# Patient Record
Sex: Male | Born: 1975
Health system: Southern US, Community
[De-identification: ages and names within clinical notes are randomized; demographics above are authoritative.]

## PROBLEM LIST (undated history)

## (undated) DIAGNOSIS — T7840XA Allergy, unspecified, initial encounter: Secondary | ICD-10-CM

## (undated) DIAGNOSIS — F419 Anxiety disorder, unspecified: Secondary | ICD-10-CM

## (undated) DIAGNOSIS — H548 Legal blindness, as defined in USA: Secondary | ICD-10-CM

## (undated) DIAGNOSIS — H409 Unspecified glaucoma: Secondary | ICD-10-CM

## (undated) DIAGNOSIS — M797 Fibromyalgia: Secondary | ICD-10-CM

## (undated) DIAGNOSIS — H269 Unspecified cataract: Secondary | ICD-10-CM

## (undated) HISTORY — DX: Legal blindness, as defined in USA: H54.8

## (undated) HISTORY — DX: Anxiety disorder, unspecified: F41.9

## (undated) HISTORY — DX: Unspecified cataract: H26.9

## (undated) HISTORY — PX: HERNIA REPAIR: SHX51

## (undated) HISTORY — DX: Allergy, unspecified, initial encounter: T78.40XA

## (undated) HISTORY — PX: EYE SURGERY: SHX253

---

## 1999-09-25 ENCOUNTER — Emergency Department (HOSPITAL_COMMUNITY): Admission: EM | Admit: 1999-09-25 | Discharge: 1999-09-25 | Payer: Self-pay | Admitting: *Deleted

## 1999-10-24 ENCOUNTER — Emergency Department (HOSPITAL_COMMUNITY): Admission: EM | Admit: 1999-10-24 | Discharge: 1999-10-24 | Payer: Self-pay | Admitting: Emergency Medicine

## 1999-10-24 ENCOUNTER — Encounter: Payer: Self-pay | Admitting: Emergency Medicine

## 2001-06-09 ENCOUNTER — Emergency Department (HOSPITAL_COMMUNITY): Admission: EM | Admit: 2001-06-09 | Discharge: 2001-06-09 | Payer: Self-pay | Admitting: Emergency Medicine

## 2001-10-28 ENCOUNTER — Emergency Department (HOSPITAL_COMMUNITY): Admission: EM | Admit: 2001-10-28 | Discharge: 2001-10-28 | Payer: Self-pay | Admitting: Emergency Medicine

## 2001-12-30 ENCOUNTER — Emergency Department (HOSPITAL_COMMUNITY): Admission: EM | Admit: 2001-12-30 | Discharge: 2001-12-31 | Payer: Self-pay | Admitting: Emergency Medicine

## 2002-08-08 ENCOUNTER — Emergency Department (HOSPITAL_COMMUNITY): Admission: EM | Admit: 2002-08-08 | Discharge: 2002-08-08 | Payer: Self-pay | Admitting: Emergency Medicine

## 2003-06-04 ENCOUNTER — Emergency Department (HOSPITAL_COMMUNITY): Admission: EM | Admit: 2003-06-04 | Discharge: 2003-06-04 | Payer: Self-pay | Admitting: Emergency Medicine

## 2003-06-04 ENCOUNTER — Encounter: Payer: Self-pay | Admitting: Emergency Medicine

## 2003-09-25 ENCOUNTER — Encounter: Admission: RE | Admit: 2003-09-25 | Discharge: 2003-10-16 | Payer: Self-pay | Admitting: *Deleted

## 2003-11-13 ENCOUNTER — Encounter: Admission: RE | Admit: 2003-11-13 | Discharge: 2003-11-13 | Payer: Self-pay | Admitting: Family Medicine

## 2003-12-05 ENCOUNTER — Ambulatory Visit (HOSPITAL_COMMUNITY): Admission: RE | Admit: 2003-12-05 | Discharge: 2003-12-05 | Payer: Self-pay | Admitting: Surgery

## 2003-12-05 ENCOUNTER — Encounter (INDEPENDENT_AMBULATORY_CARE_PROVIDER_SITE_OTHER): Payer: Self-pay | Admitting: Specialist

## 2003-12-19 ENCOUNTER — Encounter
Admission: RE | Admit: 2003-12-19 | Discharge: 2004-01-21 | Payer: Self-pay | Admitting: Physical Medicine and Rehabilitation

## 2004-04-20 ENCOUNTER — Emergency Department (HOSPITAL_COMMUNITY): Admission: EM | Admit: 2004-04-20 | Discharge: 2004-04-20 | Payer: Self-pay | Admitting: Emergency Medicine

## 2004-08-11 ENCOUNTER — Emergency Department (HOSPITAL_COMMUNITY): Admission: EM | Admit: 2004-08-11 | Discharge: 2004-08-12 | Payer: Self-pay | Admitting: Emergency Medicine

## 2005-07-28 ENCOUNTER — Emergency Department (HOSPITAL_COMMUNITY): Admission: EM | Admit: 2005-07-28 | Discharge: 2005-07-28 | Payer: Self-pay | Admitting: Emergency Medicine

## 2006-01-03 ENCOUNTER — Emergency Department (HOSPITAL_COMMUNITY): Admission: EM | Admit: 2006-01-03 | Discharge: 2006-01-03 | Payer: Self-pay | Admitting: Emergency Medicine

## 2006-05-29 ENCOUNTER — Emergency Department (HOSPITAL_COMMUNITY): Admission: EM | Admit: 2006-05-29 | Discharge: 2006-05-29 | Payer: Self-pay | Admitting: Emergency Medicine

## 2006-09-21 ENCOUNTER — Emergency Department (HOSPITAL_COMMUNITY): Admission: EM | Admit: 2006-09-21 | Discharge: 2006-09-21 | Payer: Self-pay | Admitting: Emergency Medicine

## 2007-12-26 ENCOUNTER — Emergency Department (HOSPITAL_COMMUNITY): Admission: EM | Admit: 2007-12-26 | Discharge: 2007-12-26 | Payer: Self-pay | Admitting: Emergency Medicine

## 2009-03-25 ENCOUNTER — Emergency Department (HOSPITAL_COMMUNITY): Admission: EM | Admit: 2009-03-25 | Discharge: 2009-03-25 | Payer: Self-pay | Admitting: Emergency Medicine

## 2009-10-06 ENCOUNTER — Emergency Department (HOSPITAL_COMMUNITY): Admission: EM | Admit: 2009-10-06 | Discharge: 2009-10-06 | Payer: Self-pay | Admitting: Emergency Medicine

## 2009-10-08 ENCOUNTER — Emergency Department (HOSPITAL_COMMUNITY): Admission: EM | Admit: 2009-10-08 | Discharge: 2009-10-08 | Payer: Self-pay | Admitting: Emergency Medicine

## 2010-05-28 ENCOUNTER — Emergency Department (HOSPITAL_COMMUNITY): Admission: EM | Admit: 2010-05-28 | Discharge: 2010-05-28 | Payer: Self-pay | Admitting: Emergency Medicine

## 2010-11-02 ENCOUNTER — Emergency Department (HOSPITAL_COMMUNITY)
Admission: EM | Admit: 2010-11-02 | Discharge: 2010-11-03 | Disposition: A | Discharge status: Home / Self Care | Payer: Self-pay | Attending: Emergency Medicine | Admitting: Emergency Medicine

## 2010-11-02 DIAGNOSIS — K5289 Other specified noninfective gastroenteritis and colitis: Secondary | ICD-10-CM | POA: Insufficient documentation

## 2010-11-02 DIAGNOSIS — Z8249 Family history of ischemic heart disease and other diseases of the circulatory system: Secondary | ICD-10-CM | POA: Insufficient documentation

## 2010-11-02 DIAGNOSIS — Z833 Family history of diabetes mellitus: Secondary | ICD-10-CM | POA: Insufficient documentation

## 2010-11-02 DIAGNOSIS — Z823 Family history of stroke: Secondary | ICD-10-CM | POA: Insufficient documentation

## 2010-11-03 LAB — POCT I-STAT, CHEM 8
Chloride: 104 mEq/L (ref 96–112)
Creatinine, Ser: 1.2 mg/dL (ref 0.4–1.5)
Hemoglobin: 16 g/dL (ref 13.0–17.0)
Potassium: 4.2 mEq/L (ref 3.5–5.1)
Sodium: 140 mEq/L (ref 135–145)

## 2010-11-03 LAB — DIFFERENTIAL
Basophils Absolute: 0 10*3/uL (ref 0.0–0.1)
Lymphocytes Relative: 11 % — ABNORMAL LOW (ref 12–46)
Lymphs Abs: 0.6 10*3/uL — ABNORMAL LOW (ref 0.7–4.0)
Monocytes Absolute: 0.3 10*3/uL (ref 0.1–1.0)
Neutro Abs: 4.4 10*3/uL (ref 1.7–7.7)

## 2010-11-03 LAB — CBC
HCT: 46 % (ref 39.0–52.0)
Hemoglobin: 15.1 g/dL (ref 13.0–17.0)
WBC: 5.3 10*3/uL (ref 4.0–10.5)

## 2010-12-14 LAB — GLUCOSE, CAPILLARY: Glucose-Capillary: 91 mg/dL (ref 70–99)

## 2011-08-03 ENCOUNTER — Emergency Department (HOSPITAL_COMMUNITY): Payer: Self-pay

## 2011-08-03 ENCOUNTER — Emergency Department (HOSPITAL_COMMUNITY)
Admission: EM | Admit: 2011-08-03 | Discharge: 2011-08-03 | Disposition: A | Discharge status: Home / Self Care | Payer: Self-pay | Attending: Emergency Medicine | Admitting: Emergency Medicine

## 2011-08-03 DIAGNOSIS — J3489 Other specified disorders of nose and nasal sinuses: Secondary | ICD-10-CM | POA: Insufficient documentation

## 2011-08-03 DIAGNOSIS — R059 Cough, unspecified: Secondary | ICD-10-CM | POA: Insufficient documentation

## 2011-08-03 DIAGNOSIS — R05 Cough: Secondary | ICD-10-CM | POA: Insufficient documentation

## 2011-08-03 DIAGNOSIS — B349 Viral infection, unspecified: Secondary | ICD-10-CM

## 2011-08-03 DIAGNOSIS — R509 Fever, unspecified: Secondary | ICD-10-CM | POA: Insufficient documentation

## 2011-08-03 DIAGNOSIS — B9789 Other viral agents as the cause of diseases classified elsewhere: Secondary | ICD-10-CM | POA: Insufficient documentation

## 2011-08-03 NOTE — ED Provider Notes (Signed)
History     CSN: [id number] Arrival date & time: No admission date for patient encounter.   First MD Initiated Contact with Patient 08/03/11 1922      Chief Complaint  Patient presents with  . Fever   Complains of cough productive of yellow sputum since yesterday with subjective fever. Also admits to  mild sore throat and pain with swallowing no shortness of breath no nausea or vomiting no other associated symptoms swallowing make sore throat worse nothing makes symptoms better no other complaint. Treated with DayQuil and NyQuil without relief (Consider location/radiation/quality/duration/timing/severity/associated sxs/prior treatment) HPI  No past medical history on file. Past medical history neck No past surgical history on file. Surgical history negative No family history on file.  History  Substance Use Topics  . Smoking status: Not on file  . Smokeless tobacco: Not on file  . Alcohol Use:      Social history no tobacco no alcohol no drug Review of Systems  Constitutional: Positive for fever.  HENT: Positive for congestion.        Sore throat  Respiratory: Positive for cough.   Cardiovascular: Negative.   Gastrointestinal: Negative.   Musculoskeletal: Negative.   Skin: Negative.   Neurological: Negative.   Hematological: Negative.   Psychiatric/Behavioral: Negative.   All other systems reviewed and are negative.    Allergies  Review of patient's allergies indicates no known allergies.  Home Medications   Current Outpatient Rx  Name Route Sig Dispense Refill  . PHENYLEPH-DOXYLAMINE-DM-APAP PO MISC Oral Take 1 each by mouth as needed. Fever /tried to break cold sweats       BP 122/78  Pulse 102  Temp(Src) 100 F (37.8 C) (Oral)  Resp 20  SpO2 99%  Physical Exam  Nursing note and vitals reviewed. Constitutional: He appears well-developed and well-nourished.  HENT:  Head: Normocephalic and atraumatic.  Eyes: Conjunctivae are normal. Pupils are  equal, round, and reactive to light.  Neck: Neck supple. No tracheal deviation present. No thyromegaly present.  Cardiovascular: Normal rate and regular rhythm.   No murmur heard. Pulmonary/Chest: Effort normal and breath sounds normal.  Abdominal: Soft. Bowel sounds are normal. He exhibits no distension. There is no tenderness.  Musculoskeletal: Normal range of motion. He exhibits no edema and no tenderness.  Neurological: He is alert. Coordination normal.  Skin: Skin is warm and dry. No rash noted.  Psychiatric: He has a normal mood and affect.    ED Course  Procedures (including critical care time)  Labs Reviewed - No data to display No results found.  Results for orders placed during the hospital encounter of 11/02/10  POCT I-STAT, CHEM 8      Component Value Range   Sodium 140  135 - 145 (mEq/L)   Potassium 4.2  3.5 - 5.1 (mEq/L)   Chloride 104  96 - 112 (mEq/L)   BUN 10  6 - 23 (mg/dL)   Creatinine, Ser 1.2  0.4 - 1.5 (mg/dL)   Glucose, Bld 409 (*) 70 - 99 (mg/dL)   Calcium, Ion 8.11 (*) 1.12 - 1.32 (mmol/L)   TCO2 24  0 - 100 (mmol/L)   Hemoglobin 16.0  13.0 - 17.0 (g/dL)   HCT 91.4  78.2 - 95.6 (%)  DIFFERENTIAL      Component Value Range   Neutrophils Relative 83 (*) 43 - 77 (%)   Neutro Abs 4.4  1.7 - 7.7 (K/uL)   Lymphocytes Relative 11 (*) 12 - 46 (%)   Lymphs  Abs 0.6 (*) 0.7 - 4.0 (K/uL)   Monocytes Relative 5  3 - 12 (%)   Monocytes Absolute 0.3  0.1 - 1.0 (K/uL)   Eosinophils Relative 1  0 - 5 (%)   Eosinophils Absolute 0.1  0.0 - 0.7 (K/uL)   Basophils Relative 0  0 - 1 (%)   Basophils Absolute 0.0  0.0 - 0.1 (K/uL)  CBC      Component Value Range   WBC 5.3  4.0 - 10.5 (K/uL)   RBC 5.69  4.22 - 5.81 (MIL/uL)   Hemoglobin 15.1  13.0 - 17.0 (g/dL)   HCT 16.1  09.6 - 04.5 (%)   MCV 80.8  78.0 - 100.0 (fL)   MCH 26.5  26.0 - 34.0 (pg)   MCHC 32.8  30.0 - 36.0 (g/dL)   RDW 40.9  81.1 - 91.4 (%)   Platelets 186  150 - 400 (K/uL)   Dg Chest 2  View  08/03/2011  *RADIOLOGY REPORT*  Clinical Data: Cough, congestion, sore throat and fever  CHEST - 2 VIEW  Comparison: The chest radiograph 10/08/2009  Findings: Normal and stable heart, mediastinal, and hilar contours. Trachea is midline.  The lungs are well expanded and clear.  No focal airspace disease, effusion, or pneumothorax.  Bony thorax appears within normal limits.  IMPRESSION: No acute cardiopulmonary disease.  Original Report Authenticated By: Britta Mccreedy, M.D.    No diagnosis found.    MDM  Suspect viral illness  Influenza possible. Plan Tylenol followup PMD if not better one week Diagnosis viral illness      Doug Sou, MD 08/03/11 2019

## 2011-08-03 NOTE — ED Notes (Signed)
Green phlegm.....100. Temp muscle aches

## 2012-06-06 ENCOUNTER — Emergency Department (HOSPITAL_COMMUNITY)
Admission: EM | Admit: 2012-06-06 | Discharge: 2012-06-06 | Disposition: A | Discharge status: Home / Self Care | Payer: Self-pay | Attending: Emergency Medicine | Admitting: Emergency Medicine

## 2012-06-06 ENCOUNTER — Encounter (HOSPITAL_COMMUNITY): Payer: Self-pay | Admitting: Emergency Medicine

## 2012-06-06 DIAGNOSIS — IMO0001 Reserved for inherently not codable concepts without codable children: Secondary | ICD-10-CM | POA: Insufficient documentation

## 2012-06-06 DIAGNOSIS — M797 Fibromyalgia: Secondary | ICD-10-CM

## 2012-06-06 HISTORY — DX: Fibromyalgia: M79.7

## 2012-06-06 HISTORY — DX: Unspecified glaucoma: H40.9

## 2012-06-06 MED ORDER — METHOCARBAMOL 500 MG PO TABS
1000.0000 mg | ORAL_TABLET | Freq: Once | ORAL | Status: AC
  Administered 2012-06-06: 1000 mg via ORAL
  Filled 2012-06-06: qty 2

## 2012-06-06 MED ORDER — IBUPROFEN 800 MG PO TABS: 800.0000 mg | ORAL_TABLET | Freq: Three times a day (TID) | ORAL | Status: DC

## 2012-06-06 MED ORDER — OXYCODONE-ACETAMINOPHEN 5-325 MG PO TABS: 1.0000 | ORAL_TABLET | Freq: Four times a day (QID) | ORAL | Status: DC | PRN

## 2012-06-06 MED ORDER — GABAPENTIN 300 MG PO CAPS: 300.0000 mg | ORAL_CAPSULE | Freq: Three times a day (TID) | ORAL | Status: DC

## 2012-06-06 MED ORDER — OXYCODONE-ACETAMINOPHEN 5-325 MG PO TABS
2.0000 | ORAL_TABLET | Freq: Once | ORAL | Status: AC
  Administered 2012-06-06: 2 via ORAL
  Filled 2012-06-06: qty 2

## 2012-06-06 MED ORDER — HYDROMORPHONE HCL PF 1 MG/ML IJ SOLN
1.0000 mg | Freq: Once | INTRAMUSCULAR | Status: AC
  Administered 2012-06-06: 1 mg via INTRAMUSCULAR
  Filled 2012-06-06: qty 1

## 2012-06-06 NOTE — ED Notes (Signed)
Pt states that his fibromyalgia is flaring up.  He states that the pain started two days ago is predominately in his back around his left shoulder blade and across his pelvis.  States that he has had N/V since last night.

## 2012-06-06 NOTE — ED Provider Notes (Signed)
History     CSN: [id number]  Arrival date & time 06/06/12  1308   First MD Initiated Contact with Patient 06/06/12 1855      Chief Complaint  Patient presents with  . Back Pain  . Hip Pain    (Consider location/radiation/quality/duration/timing/severity/associated sxs/prior treatment) HPI Comments: Bryce [name] 36 y.o. male   The chief complaint is: Patient presents with:   Back Pain   Hip Pain   The patient has medical history significant for:   Past Medical History:   Fibromyalgia                                                 Glaucoma                                                    Patient presents for pain managagement for his fibromyalgia. Patient states that he was diagnosed 10 years ago and has been on multiple medications for pain. He states that due to lack of insurance he currently does not have any medications for pain management. Today her reports diffuse myalgias rated 10/10. Patient states the pain is like his prior fibromyalgia pain. Associated symptoms include and episode nausea and vomiting that the patient states is due to pain. Denies fever or chills. Denies abdominal pain. Denies difficulty ambulating. Denies difficulty ambulating.      No language interpreter was used.    Past Medical History  Diagnosis Date  . Fibromyalgia   . Glaucoma     Past Surgical History  Procedure Date  . Eye surgery   . Hernia repair     No family history on file.  History  Substance Use Topics  . Smoking status: Never Smoker   . Smokeless tobacco: Not on file  . Alcohol Use: No      Review of Systems  Constitutional: Negative for fever and chills.  Gastrointestinal: Positive for nausea and vomiting. Negative for abdominal pain.  Musculoskeletal: Positive for myalgias. Negative for gait problem.  All other systems reviewed and are negative.    Allergies  Citrus  Home Medications   Current Outpatient Rx  Name Route Sig Dispense Refill  .  ACETAMINOPHEN 500 MG PO TABS Oral Take 1,000 mg by mouth every 6 (six) hours as needed. Pain      BP 127/88  Pulse 84  Temp 99.2 F (37.3 C) (Oral)  Resp 15  SpO2 100%  Physical Exam  Nursing note and vitals reviewed. Constitutional: He appears well-developed and well-nourished. No distress.  HENT:  Head: Normocephalic and atraumatic.  Mouth/Throat: Oropharynx is clear and moist.  Eyes: EOM are normal. No scleral icterus.  Neck: Normal range of motion. Neck supple.  Cardiovascular: Normal rate and regular rhythm.   Pulmonary/Chest: Effort normal and breath sounds normal.  Abdominal: Soft. Bowel sounds are normal. There is no tenderness.  Musculoskeletal: Normal range of motion. He exhibits tenderness. He exhibits no edema.       Patient has general tenderness to palpation.  Neurological: He is alert.  Skin: Skin is warm and dry.    ED Course  Procedures (including critical care time)  Labs Reviewed - No data to display No results  found.   1. Fibromyalgia       MDM  Patient presented for exacerbation of his fibromyalgia. Patient given pain medication and gabapentin with some relief. Patient discharged on same regimen with recommendation to follow-up with primary care. Return precautions given verbally and in discharge summary.         Pixie Casino, PA-C 06/07/12 [phone]

## 2012-06-08 NOTE — ED Provider Notes (Signed)
Medical screening examination/treatment/procedure(s) were performed by non-physician practitioner and as supervising physician I was immediately available for consultation/collaboration.  Jones Skene, M.D.      Jones Skene, MD 06/08/12 1317

## 2012-11-03 ENCOUNTER — Emergency Department (HOSPITAL_COMMUNITY)
Admission: EM | Admit: 2012-11-03 | Discharge: 2012-11-03 | Disposition: A | Discharge status: Home / Self Care | Payer: Self-pay | Attending: Emergency Medicine | Admitting: Emergency Medicine

## 2012-11-03 DIAGNOSIS — M79609 Pain in unspecified limb: Secondary | ICD-10-CM | POA: Insufficient documentation

## 2012-11-03 DIAGNOSIS — R0602 Shortness of breath: Secondary | ICD-10-CM | POA: Insufficient documentation

## 2012-11-03 DIAGNOSIS — Z8669 Personal history of other diseases of the nervous system and sense organs: Secondary | ICD-10-CM | POA: Insufficient documentation

## 2012-11-03 DIAGNOSIS — G8929 Other chronic pain: Secondary | ICD-10-CM | POA: Insufficient documentation

## 2012-11-03 DIAGNOSIS — J3489 Other specified disorders of nose and nasal sinuses: Secondary | ICD-10-CM | POA: Insufficient documentation

## 2012-11-03 DIAGNOSIS — Z79899 Other long term (current) drug therapy: Secondary | ICD-10-CM | POA: Insufficient documentation

## 2012-11-03 DIAGNOSIS — M549 Dorsalgia, unspecified: Secondary | ICD-10-CM | POA: Insufficient documentation

## 2012-11-03 DIAGNOSIS — R110 Nausea: Secondary | ICD-10-CM | POA: Insufficient documentation

## 2012-11-03 DIAGNOSIS — M797 Fibromyalgia: Secondary | ICD-10-CM

## 2012-11-03 DIAGNOSIS — IMO0001 Reserved for inherently not codable concepts without codable children: Secondary | ICD-10-CM | POA: Insufficient documentation

## 2012-11-03 MED ORDER — GABAPENTIN 300 MG PO CAPS: 300.0000 mg | ORAL_CAPSULE | Freq: Three times a day (TID) | ORAL | Status: DC

## 2012-11-03 MED ORDER — OXYCODONE-ACETAMINOPHEN 5-325 MG PO TABS
2.0000 | ORAL_TABLET | Freq: Once | ORAL | Status: AC
  Administered 2012-11-03: 2 via ORAL
  Filled 2012-11-03: qty 2

## 2012-11-03 NOTE — ED Provider Notes (Signed)
History  This chart was scribed for non-physician practitioner working with Ward Givens, MD, by Candelaria Stagers, ED Scribe. This patient was seen in room WTR5/WTR5 and the patient's care was started at 6:45 PM   CSN: [id number]  Arrival date & time 11/03/12  1826   First MD Initiated Contact with Patient 11/03/12 1829      Chief Complaint  Patient presents with  . Muscle Pain    The history is provided by the patient. No language interpreter was used.   Bryce [name] is a 37 y.o. male who presents to the Emergency Department complaining of bilateral leg and back pain associated with h/o fibromyalgia that began to flare up yesterday morning.  Pt reports he has taken Vicodin from a friend for the pain with no relief.  Used to take gabapentin but no longer has this. The pain is preventing him from sleeping.  He is also experiencing nausea, SOB, and congestion which he reports is chronic due to eye drops he uses daily for glaucoma.  Pt contributes the SOB to anxiety associated with pain.  Pt had xrays and an MRI to back with no remarkable findings.  Pt is ambulatory with pain.  He did not drive himself here.  Pt has h/o glaucoma and is legally blind.  No PCP. Past Medical History  Diagnosis Date  . Fibromyalgia   . Glaucoma     Past Surgical History  Procedure Laterality Date  . Eye surgery    . Hernia repair      No family history on file.  History  Substance Use Topics  . Smoking status: Never Smoker   . Smokeless tobacco: Not on file  . Alcohol Use: No      Review of Systems  Musculoskeletal: Positive for back pain and arthralgias (bilateral leg pain).  All other systems reviewed and are negative.    Allergies  Citrus  Home Medications   Current Outpatient Rx  Name  Route  Sig  Dispense  Refill  . brimonidine (ALPHAGAN) 0.2 % ophthalmic solution   Both Eyes   Place 1 drop into both eyes 3 (three) times daily.         . dorzolamide-timolol (COSOPT) 22.3-6.8  MG/ML ophthalmic solution   Both Eyes   Place 1 drop into both eyes 2 (two) times daily.         [name] gabapentin (NEURONTIN) 300 MG capsule   Oral   Take 1 capsule (300 mg total) by mouth 3 (three) times daily.   60 capsule   1   . ibuprofen (ADVIL,MOTRIN) 800 MG tablet   Oral   Take 1 tablet (800 mg total) by mouth 3 (three) times daily.   21 tablet   0   . oxyCODONE-acetaminophen (PERCOCET/ROXICET) 5-325 MG per tablet   Oral   Take 1 tablet by mouth every 6 (six) hours as needed for pain.   20 tablet   0   . travoprost, benzalkonium, (TRAVATAN) 0.004 % ophthalmic solution   Both Eyes   Place 1 drop into both eyes at bedtime.           BP 129/78  Pulse 78  Temp(Src) 98.8 F (37.1 C) (Oral)  SpO2 98%  Physical Exam  Nursing note and vitals reviewed. Constitutional: He is oriented to person, place, and time. He appears well-developed and well-nourished. No distress.  HENT:  Head: Normocephalic and atraumatic.  Right Ear: Tympanic membrane and ear canal normal.  Left Ear: Tympanic  membrane and ear canal normal.  Mouth/Throat: Oropharynx is clear and moist. No oropharyngeal exudate.  Eyes: Conjunctivae and EOM are normal.  Neck: Normal range of motion. Neck supple. No tracheal deviation present.  Cardiovascular: Normal rate, regular rhythm and normal heart sounds.   No murmur heard. Pulmonary/Chest: Effort normal and breath sounds normal. No respiratory distress. He has no wheezes. He has no rales.  Abdominal: Soft. Bowel sounds are normal. There is no tenderness.  Musculoskeletal: Normal range of motion.  Pain and stiffness noted more in posterior legs and hips than anterior.    Neurological: He is alert and oriented to person, place, and time.  Chronic numbness and neuropathy to legs bilaterally.    Skin: Skin is warm and dry.  Psychiatric: He has a normal mood and affect. His behavior is normal.    ED Course  Procedures  DIAGNOSTIC STUDIES: Oxygen  Saturation is 98% on room air, normal by my interpretation.    COORDINATION OF CARE:  6:52 PM Discussed course of care with pt.  Pt understands and agrees.      Labs Reviewed - No data to display No results found.   1. Fibromyalgia   2. Chronic pain       MDM  37 y/o male with fibromyalgia needing medication. Rx gabapentin. Percocet given in ED. Resource guide given for PCP f/u. Return precautions discussed. Patient states understanding of plan and is agreeable.    I personally performed the services described in this documentation, which was scribed in my presence. The recorded information has been reviewed and is accurate.       Trevor Mace, PA-C 11/03/12 Ernest[place]

## 2012-11-03 NOTE — ED Notes (Signed)
Pt c/o pain to back and legs. Pt states he has a hx of Fibromyalgia and is legally blind. Pt ambulatory to exam room with slow steady gait. Pt states his wife drove him here. States he has taken Vicodin for pain this morning.

## 2012-11-03 NOTE — ED Provider Notes (Signed)
Medical screening examination/treatment/procedure(s) were performed by non-physician practitioner and as supervising physician I was immediately available for consultation/collaboration. Xitlaly Ault, MD, FACEP   Keisi Eckford L Kvon Mcilhenny, MD 11/03/12 1936 

## 2013-02-25 ENCOUNTER — Emergency Department (HOSPITAL_COMMUNITY)
Admission: EM | Admit: 2013-02-25 | Discharge: 2013-02-25 | Disposition: A | Discharge status: Home / Self Care | Payer: Medicaid Other | Attending: Emergency Medicine | Admitting: Emergency Medicine

## 2013-02-25 ENCOUNTER — Encounter (HOSPITAL_COMMUNITY): Payer: Self-pay

## 2013-02-25 DIAGNOSIS — Z91040 Latex allergy status: Secondary | ICD-10-CM | POA: Insufficient documentation

## 2013-02-25 DIAGNOSIS — IMO0001 Reserved for inherently not codable concepts without codable children: Secondary | ICD-10-CM | POA: Insufficient documentation

## 2013-02-25 DIAGNOSIS — Z8669 Personal history of other diseases of the nervous system and sense organs: Secondary | ICD-10-CM | POA: Insufficient documentation

## 2013-02-25 DIAGNOSIS — M79609 Pain in unspecified limb: Secondary | ICD-10-CM | POA: Insufficient documentation

## 2013-02-25 DIAGNOSIS — Z79899 Other long term (current) drug therapy: Secondary | ICD-10-CM | POA: Insufficient documentation

## 2013-02-25 DIAGNOSIS — M791 Myalgia, unspecified site: Secondary | ICD-10-CM

## 2013-02-25 DIAGNOSIS — R21 Rash and other nonspecific skin eruption: Secondary | ICD-10-CM | POA: Insufficient documentation

## 2013-02-25 LAB — D-DIMER, QUANTITATIVE: D-Dimer, Quant: <0.27 ug/mL-FEU (ref 0.00–0.48)

## 2013-02-25 MED ORDER — DIPHENHYDRAMINE HCL 25 MG PO CAPS
25.0000 mg | ORAL_CAPSULE | Freq: Once | ORAL | Status: AC
  Administered 2013-02-25: 25 mg via ORAL
  Filled 2013-02-25: qty 1

## 2013-02-25 MED ORDER — IBUPROFEN 200 MG PO TABS
600.0000 mg | ORAL_TABLET | Freq: Once | ORAL | Status: AC
  Administered 2013-02-25: 600 mg via ORAL
  Filled 2013-02-25: qty 3

## 2013-02-25 MED ORDER — ACETAMINOPHEN-CODEINE #3 300-30 MG PO TABS: 1.0000 | ORAL_TABLET | Freq: Four times a day (QID) | ORAL | Status: DC | PRN

## 2013-02-25 MED ORDER — PREDNISONE 50 MG PO TABS: 50.0000 mg | ORAL_TABLET | Freq: Every day | ORAL | Status: DC

## 2013-02-25 NOTE — ED Notes (Signed)
He c/o persistent pain in left calf which is aggravated by all activity.  He states he has hx of fibromyalgia and commonly has diffuse aches, "but this is different".  He also states he has had a non-pruritic, dry, flaky rash in small patches on upper extremities and left post. Shoulder area x 1 month which has remained unchanged by application of o.t.c. Hydrocortisone cream.  He is in no distress.  He denies any trauma, nor any recent periods of extended immobility/long-distance travel.

## 2013-02-25 NOTE — ED Provider Notes (Signed)
History     CSN: [id number]  Arrival date & time 02/25/13  0920   First MD Initiated Contact with Patient 02/25/13 [phone]      Chief Complaint  Patient presents with  . Leg Pain    Also has few spots of dry rash    (Consider location/radiation/quality/duration/timing/severity/associated sxs/prior treatment) HPI Comments: Pt c/o persistent pain in left calf which is aggravated by all activity. Calf pain x 2 weeks.  He states he has hx of fibromyalgia and commonly has diffuse aches, "but this is different".  He also states he has had a non-pruritic, dry, flaky rash in the RUE, that has spread. No hx of allergies, no hx of skin dz, no exposures.   Patient is a 37 y.o. male presenting with leg pain. The history is provided by the patient.  Leg Pain   Past Medical History  Diagnosis Date  . Fibromyalgia   . Glaucoma     Past Surgical History  Procedure Laterality Date  . Eye surgery    . Hernia repair      History reviewed. No pertinent family history.  History  Substance Use Topics  . Smoking status: Never Smoker   . Smokeless tobacco: Not on file  . Alcohol Use: No      Review of Systems  Constitutional: Negative for activity change and appetite change.  Respiratory: Negative for cough and shortness of breath.   Cardiovascular: Negative for chest pain.  Gastrointestinal: Negative for abdominal pain.  Genitourinary: Negative for dysuria.  Musculoskeletal: Positive for myalgias.  Skin: Positive for rash.    Allergies  Citrus and Latex  Home Medications   Current Outpatient Rx  Name  Route  Sig  Dispense  Refill  . brimonidine (ALPHAGAN) 0.2 % ophthalmic solution   Both Eyes   Place 1 drop into both eyes 2 (two) times daily.          . cycloSPORINE (RESTASIS) 0.05 % ophthalmic emulsion   Both Eyes   Place 1 drop into both eyes 6 (six) times daily.         . dorzolamide-timolol (COSOPT) 22.3-6.8 MG/ML ophthalmic solution   Both Eyes   Place 1 drop  into both eyes 2 (two) times daily.         [name] gabapentin (NEURONTIN) 300 MG capsule   Oral   Take 1 capsule (300 mg total) by mouth 3 (three) times daily.   90 capsule   0   . ibuprofen (ADVIL,MOTRIN) 800 MG tablet   Oral   Take 1 tablet (800 mg total) by mouth 3 (three) times daily.   21 tablet   0   . oxyCODONE-acetaminophen (PERCOCET/ROXICET) 5-325 MG per tablet   Oral   Take 1 tablet by mouth every 6 (six) hours as needed for pain.   20 tablet   0   . travoprost, benzalkonium, (TRAVATAN) 0.004 % ophthalmic solution   Both Eyes   Place 1 drop into both eyes at bedtime.           BP 121/80  Pulse 85  Temp(Src) 99.2 F (37.3 C) (Oral)  Resp 18  SpO2 96%  Physical Exam  Nursing note and vitals reviewed. Constitutional: He is oriented to person, place, and time. He appears well-developed.  HENT:  Head: Normocephalic and atraumatic.  Eyes: Conjunctivae and EOM are normal. Pupils are equal, round, and reactive to light.  Neck: Normal range of motion. Neck supple.  Cardiovascular: Normal rate and regular rhythm.  Pulmonary/Chest: Effort normal and breath sounds normal.  Abdominal: Soft. Bowel sounds are normal. He exhibits no distension. There is no tenderness. There is no rebound and no guarding.  Musculoskeletal:       Left lower leg: He exhibits tenderness. He exhibits no bony tenderness, no swelling and no edema.  Left calf tenderness.  Neurological: He is alert and oriented to person, place, and time.  Skin: Skin is warm. Rash noted.  Pt has a few scattered scaly lesion, with multiple vesicles grouped together. No signs of abscess/infection. No oral involvement.      ED Course  Procedures (including critical care time)  Labs Reviewed  D-DIMER, QUANTITATIVE   No results found.   No diagnosis found.    MDM  Pt comes in with cc of left leg pain, rash. Pulses are normal, no concerns for ischemic process of the leg. He has calf tendnress, WELLS  score is 1, we will get dimer. Rash- not sure what the etiology is, however, no red flags on hx or exam to suggest emergenct etiologies.   12:18 PM Dimer neg- no DVT. Asked PCP f/u for further evaluation of the rash  Derwood Kaplan, MD 02/25/13 1220

## 2014-01-26 ENCOUNTER — Institutional Professional Consult (permissible substitution): Payer: Self-pay | Admitting: Neurology

## 2014-07-25 ENCOUNTER — Emergency Department (HOSPITAL_COMMUNITY)
Admission: EM | Admit: 2014-07-25 | Discharge: 2014-07-25 | Disposition: A | Discharge status: Home / Self Care | Payer: Medicaid Other | Attending: Emergency Medicine | Admitting: Emergency Medicine

## 2014-07-25 ENCOUNTER — Encounter (HOSPITAL_COMMUNITY): Payer: Self-pay | Admitting: Emergency Medicine

## 2014-07-25 DIAGNOSIS — Z79899 Other long term (current) drug therapy: Secondary | ICD-10-CM | POA: Insufficient documentation

## 2014-07-25 DIAGNOSIS — M545 Low back pain, unspecified: Secondary | ICD-10-CM

## 2014-07-25 DIAGNOSIS — G479 Sleep disorder, unspecified: Secondary | ICD-10-CM | POA: Diagnosis not present

## 2014-07-25 DIAGNOSIS — Z8669 Personal history of other diseases of the nervous system and sense organs: Secondary | ICD-10-CM | POA: Insufficient documentation

## 2014-07-25 DIAGNOSIS — Z791 Long term (current) use of non-steroidal anti-inflammatories (NSAID): Secondary | ICD-10-CM | POA: Diagnosis not present

## 2014-07-25 DIAGNOSIS — Z91040 Latex allergy status: Secondary | ICD-10-CM | POA: Insufficient documentation

## 2014-07-25 MED ORDER — PREDNISONE 50 MG PO TABS: ORAL_TABLET | ORAL | Status: DC

## 2014-07-25 MED ORDER — PREDNISONE 20 MG PO TABS
60.0000 mg | ORAL_TABLET | Freq: Once | ORAL | Status: AC
  Administered 2014-07-25: 60 mg via ORAL
  Filled 2014-07-25: qty 3

## 2014-07-25 MED ORDER — MORPHINE SULFATE 4 MG/ML IJ SOLN
4.0000 mg | Freq: Once | INTRAMUSCULAR | Status: AC
  Administered 2014-07-25: 4 mg via INTRAMUSCULAR
  Filled 2014-07-25: qty 1

## 2014-07-25 MED ORDER — DIAZEPAM 5 MG PO TABS
5.0000 mg | ORAL_TABLET | Freq: Once | ORAL | Status: AC
  Administered 2014-07-25: 5 mg via ORAL
  Filled 2014-07-25: qty 1

## 2014-07-25 MED ORDER — SODIUM CHLORIDE 0.9 % IV BOLUS (SEPSIS): 1000.0000 mL | Freq: Once | INTRAVENOUS | Status: DC

## 2014-07-25 MED ORDER — DIAZEPAM 5 MG PO TABS: 5.0000 mg | ORAL_TABLET | Freq: Four times a day (QID) | ORAL | Status: DC | PRN

## 2014-07-25 MED ORDER — OXYCODONE-ACETAMINOPHEN 5-325 MG PO TABS: ORAL_TABLET | ORAL | Status: DC

## 2014-07-25 MED ORDER — MORPHINE SULFATE 4 MG/ML IJ SOLN: 4.0000 mg | INTRAMUSCULAR | Status: DC | PRN

## 2014-07-25 NOTE — Progress Notes (Signed)
  CARE MANAGEMENT ED NOTE 07/25/2014  Patient:  Tilford PillarKIGHT,Jamisen L   Account Number:  [id number]  Date Initiated:  07/25/2014  Documentation initiated by:  Radford PaxFERRERO,Erle Guster  Subjective/Objective Assessment:   Patient presents to Ed with lower back spasms     Subjective/Objective Assessment Detail:   Patient with pmhx of Fibromyalgia for the past ten years.     Action/Plan:   Action/Plan Detail:   Anticipated DC Date:       Status Recommendation to Physician:   Result of Recommendation:    Other ED Services  Consult Working Plan    DC Planning Services  Other  PCP issues    Choice offered to / List presented to:            Status of service:  Completed, signed off  ED Comments:   ED Comments Detail:  Patient listed as having Medicaid [place] Access.  PCP listed on Medicaid card is the [facility]. Ssytem updated.

## 2014-07-25 NOTE — ED Notes (Signed)
Pt reports hx of fibromyalgia.  States that he has been having low back spasms x 2 days that make it painful for him to walk.

## 2014-07-25 NOTE — ED Provider Notes (Signed)
CSN: [id number]     Arrival date & time 07/25/14  1854 History  This chart was scribed for non-physician practitioner, Wynetta EmeryNicole Nameer Summer, PA-C working with Ward GivensIva L Knapp, MD by Luisa DagoPriscilla Tutu, ED scribe. This patient was seen in room WTR9/WTR9 and the patient's care was started at 7:12 PM.     Chief Complaint  Patient presents with  . Back Pain   The history is provided by the patient. No language interpreter was used.   HPI Comments: Bryce Pillarlvin L [name] is a 38 y.o. male with a hx of fibromyalgia for the past 10 years presents to the Emergency Department complaining of sudden onset worsening intermittent lower back spasms that started 2 days ago. He denies the pain radiating down his legs, however, he states that when he moves it feels like "electricity" going across his back. Pt also endorses associated sleep disturbances secondary to the back pain. Denies any falls, recent injuries, IV drug use, constipation, diarrhea, fecal incontinence, bladder incontinence, or hx of DM.  Past Medical History  Diagnosis Date  . Fibromyalgia   . Glaucoma    Past Surgical History  Procedure Laterality Date  . Eye surgery    . Hernia repair     No family history on file. History  Substance Use Topics  . Smoking status: Never Smoker   . Smokeless tobacco: Not on file  . Alcohol Use: No    Review of Systems  Constitutional: Negative for chills, diaphoresis, appetite change and fatigue.  HENT: Negative for mouth sores, sore throat and trouble swallowing.   Eyes: Negative for visual disturbance.  Respiratory: Negative for cough, chest tightness, shortness of breath and wheezing.   Cardiovascular: Negative for leg swelling.  Gastrointestinal: Negative for nausea, vomiting, diarrhea, constipation and abdominal distention.  Endocrine: Negative for polydipsia, polyphagia and polyuria.  Genitourinary: Negative for urgency, frequency, hematuria, flank pain and difficulty urinating.  Musculoskeletal: Negative  for joint swelling and gait problem.  Skin: Negative for color change, pallor and rash.  Neurological: Negative for dizziness, syncope and light-headedness.  Hematological: Does not bruise/bleed easily.  Psychiatric/Behavioral: Negative for behavioral problems and confusion.      Allergies  Citrus and Latex  Home Medications   Prior to Admission medications   Medication Sig Start Date End Date Taking? Authorizing Provider  brimonidine (ALPHAGAN) 0.2 % ophthalmic solution Place 1 drop into both eyes 2 (two) times daily.    Yes Historical Provider, MD  dorzolamide-timolol (COSOPT) 22.3-6.8 MG/ML ophthalmic solution Place 1 drop into both eyes 2 (two) times daily.   Yes Historical Provider, MD  DULoxetine (CYMBALTA) 30 MG capsule Take 30 mg by mouth daily.   Yes Historical Provider, MD  gabapentin (NEURONTIN) 300 MG capsule Take 1 capsule (300 mg total) by mouth 3 (three) times daily. 11/03/12  Yes Robyn M Hess, PA-C  ibuprofen (ADVIL,MOTRIN) 800 MG tablet Take 1 tablet (800 mg total) by mouth 3 (three) times daily. 06/06/12  Yes Tia Oliveri, PA-C  travoprost, benzalkonium, (TRAVATAN) 0.004 % ophthalmic solution Place 1 drop into both eyes at bedtime.   Yes Historical Provider, MD  diazepam (VALIUM) 5 MG tablet Take 1 tablet (5 mg total) by mouth every 6 (six) hours as needed for anxiety (spasms). 07/25/14   Arley Salamone, PA-C  oxyCODONE-acetaminophen (PERCOCET/ROXICET) 5-325 MG per tablet 1 to 2 tabs PO q6hrs  PRN for pain 07/25/14   Joni ReiningNicole Audrie Kuri, PA-C  predniSONE (DELTASONE) 50 MG tablet Take 1 tablet daily with breakfast 07/25/14   Wynetta EmeryNicole Dsean Vantol,  PA-C   BP 123/78 mmHg  Pulse 89  Temp(Src) 98.9 F (37.2 C) (Oral)  Resp 18  SpO2 100%  Physical Exam  Constitutional: He is oriented to person, place, and time. He appears well-developed and well-nourished. No distress.  HENT:  Head: Normocephalic and atraumatic.  Eyes: Conjunctivae and EOM are normal. Pupils are equal, round,  and reactive to light.  Neck: Normal range of motion. Neck supple. No thyromegaly present.  Cardiovascular: Normal rate.   Pulmonary/Chest: Effort normal. No respiratory distress.  Musculoskeletal: Normal range of motion.       Arms: No point tenderness to percussion of lumbar spinal processes.  No TTP or paraspinal muscular spasm. Strength is 5 out of 5 to bilateral lower extremities at hip and knee; extensor hallucis longus 5 out of 5. Ankle strength 5 out of 5, no clonus, neurovascularly intact. No saddle anaesthesia. Patellar reflexes are 2+ bilaterally.       Lymphadenopathy:    He has no cervical adenopathy.  Neurological: He is alert and oriented to person, place, and time.  Skin: Skin is warm and dry.  Psychiatric: He has a normal mood and affect. His behavior is normal.  Nursing note and vitals reviewed.   ED Course  Procedures (including critical care time)  DIAGNOSTIC STUDIES: Oxygen Saturation is 100% on RA, normal by my interpretation.    COORDINATION OF CARE: 7:17 PM- Will prescribe Valium and Percocet. Pt advised of plan for treatment and pt agrees.  Medications  diazepam (VALIUM) tablet 5 mg (5 mg Oral Given 07/25/14 1955)  predniSONE (DELTASONE) tablet 60 mg (60 mg Oral Given 07/25/14 1955)  morphine 4 MG/ML injection 4 mg (4 mg Intramuscular Given 07/25/14 1955)    Labs Review Labs Reviewed - No data to display  Imaging Review No results found.   EKG Interpretation None      MDM   Final diagnoses:  Bilateral low back pain without sciatica    Filed Vitals:   07/25/14 1902 07/25/14 2023  BP: 123/78 108/70  Pulse: 89 72  Temp: 98.9 F (37.2 C)   TempSrc: Oral   Resp: 18 14  SpO2: 100% 97%    Medications  diazepam (VALIUM) tablet 5 mg (5 mg Oral Given 07/25/14 1955)  predniSONE (DELTASONE) tablet 60 mg (60 mg Oral Given 07/25/14 1955)  morphine 4 MG/ML injection 4 mg (4 mg Intramuscular Given 07/25/14 1955)    Bryce [name] is a 38 y.o.  male presenting with low back pain. Patient improved with pain medication in the ED. Neuro exam is nonfocal. No red flags for complication of low back pain.  Evaluation does not show pathology that would require ongoing emergent intervention or inpatient treatment. Pt is hemodynamically stable and mentating appropriately. Discussed findings and plan with patient/guardian, who agrees with care plan. All questions answered. Return precautions discussed and outpatient follow up given.   Discharge Medication List as of 07/25/2014  8:07 PM    START taking these medications   Details  diazepam (VALIUM) 5 MG tablet Take 1 tablet (5 mg total) by mouth every 6 (six) hours as needed for anxiety (spasms)., Starting 07/25/2014, Until Discontinued, Print           I personally performed the services described in this documentation, which was scribed in my presence. The recorded information has been reviewed and is accurate.    Wynetta Emeryicole Maston Wight, PA-C 07/25/14 2049  Ward GivensIva L Knapp, MD 07/25/14 [id number]

## 2014-07-25 NOTE — Discharge Instructions (Signed)
Please take ibuprofen 400mg (this is normally 2 over the counter pills) every 6 hours (take with food to minimze stomach irritation).  ° °Take valium and/or percocet for breakthrough pain, do not drink alcohol, drive, care for children or perfom other critical tasks while taking valium and/or percocet. ° °Please follow with your primary care doctor in the next 2 days for a check-up. They must obtain records for further management.  ° °Do not hesitate to return to the Emergency Department for any new, worsening or concerning symptoms.  ° ° °Back Pain, Adult °Low back pain is very common. About 1 in 5 people have back pain. The cause of low back pain is rarely dangerous. The pain often gets better over time. About half of people with a sudden onset of back pain feel better in just 2 weeks. About 8 in 10 people feel better by 6 weeks.  °CAUSES °Some common causes of back pain include: °· Strain of the muscles or ligaments supporting the spine. °· Wear and tear (degeneration) of the spinal discs. °· Arthritis. °· Direct injury to the back. °DIAGNOSIS °Most of the time, the direct cause of low back pain is not known. However, back pain can be treated effectively even when the exact cause of the pain is unknown. Answering your caregiver's questions about your overall health and symptoms is one of the most accurate ways to make sure the cause of your pain is not dangerous. If your caregiver needs more information, he or she may order lab work or imaging tests (X-rays or MRIs). However, even if imaging tests show changes in your back, this usually does not require surgery. °HOME CARE INSTRUCTIONS °For many people, back pain returns. Since low back pain is rarely dangerous, it is often a condition that people can learn to manage on their own.  °· Remain active. It is stressful on the back to sit or stand in one place. Do not sit, drive, or stand in one place for more than 30 minutes at a time. Take short walks on level  surfaces as soon as pain allows. Try to increase the length of time you walk each day. °· Do not stay in bed. Resting more than 1 or 2 days can delay your recovery. °· Do not avoid exercise or work. Your body is made to move. It is not dangerous to be active, even though your back may hurt. Your back will likely heal faster if you return to being active before your pain is gone. °· Pay attention to your body when you  bend and lift. Many people have less discomfort when lifting if they bend their knees, keep the load close to their bodies, and avoid twisting. Often, the most comfortable positions are those that put less stress on your recovering back. °· Find a comfortable position to sleep. Use a firm mattress and lie on your side with your knees slightly bent. If you lie on your back, put a pillow under your knees. °· Only take over-the-counter or prescription medicines as directed by your caregiver. Over-the-counter medicines to reduce pain and inflammation are often the most helpful. Your caregiver may prescribe muscle relaxant drugs. These medicines help dull your pain so you can more quickly return to your normal activities and healthy exercise. °· Put ice on the injured area. °¨ Put ice in a plastic bag. °¨ Place a towel between your skin and the bag. °¨ Leave the ice on for 15-20 minutes, 03-04 times a day for the first 2 to 3 days. After that, ice and heat may be alternated to reduce pain   and spasms. °· Ask your caregiver about trying back exercises and gentle massage. This may be of some benefit. °· Avoid feeling anxious or stressed. Stress increases muscle tension and can worsen back pain. It is important to recognize when you are anxious or stressed and learn ways to manage it. Exercise is a great option. °SEEK MEDICAL CARE IF: °· You have pain that is not relieved with rest or medicine. °· You have pain that does not improve in 1 week. °· You have new symptoms. °· You are generally not feeling  well. °SEEK IMMEDIATE MEDICAL CARE IF:  °· You have pain that radiates from your back into your legs. °· You develop new bowel or bladder control problems. °· You have unusual weakness or numbness in your arms or legs. °· You develop nausea or vomiting. °· You develop abdominal pain. °· You feel faint. °Document Released: 08/24/2005 Document Revised: 02/23/2012 Document Reviewed: 12/26/2013 °Exit[employer]® Patient Information ©2015 [employer]. This information is not intended to replace advice given to you by your health care provider. Make sure you discuss any questions you have with your health care provider. ° ° °

## 2014-09-13 ENCOUNTER — Encounter (HOSPITAL_COMMUNITY): Payer: Self-pay | Admitting: Emergency Medicine

## 2014-09-13 ENCOUNTER — Emergency Department (HOSPITAL_COMMUNITY)
Admission: EM | Admit: 2014-09-13 | Discharge: 2014-09-13 | Disposition: A | Discharge status: Home / Self Care | Payer: Medicaid Other | Attending: Emergency Medicine | Admitting: Emergency Medicine

## 2014-09-13 DIAGNOSIS — M797 Fibromyalgia: Secondary | ICD-10-CM | POA: Insufficient documentation

## 2014-09-13 DIAGNOSIS — Z79899 Other long term (current) drug therapy: Secondary | ICD-10-CM | POA: Diagnosis not present

## 2014-09-13 DIAGNOSIS — R112 Nausea with vomiting, unspecified: Secondary | ICD-10-CM | POA: Diagnosis not present

## 2014-09-13 DIAGNOSIS — Z91040 Latex allergy status: Secondary | ICD-10-CM | POA: Insufficient documentation

## 2014-09-13 DIAGNOSIS — H4010X3 Unspecified open-angle glaucoma, severe stage: Secondary | ICD-10-CM | POA: Diagnosis not present

## 2014-09-13 DIAGNOSIS — H409 Unspecified glaucoma: Secondary | ICD-10-CM | POA: Insufficient documentation

## 2014-09-13 DIAGNOSIS — R197 Diarrhea, unspecified: Secondary | ICD-10-CM | POA: Insufficient documentation

## 2014-09-13 DIAGNOSIS — R1010 Upper abdominal pain, unspecified: Secondary | ICD-10-CM | POA: Diagnosis not present

## 2014-09-13 DIAGNOSIS — R531 Weakness: Secondary | ICD-10-CM | POA: Insufficient documentation

## 2014-09-13 LAB — I-STAT CG4 LACTIC ACID, ED: Lactic Acid, Venous: 1.04 mmol/L (ref 0.5–2.2)

## 2014-09-13 LAB — CBC WITH DIFFERENTIAL/PLATELET
BASOS ABS: 0 10*3/uL (ref 0.0–0.1)
Basophils Relative: 0 % (ref 0–1)
Eosinophils Absolute: 0.2 10*3/uL (ref 0.0–0.7)
Eosinophils Relative: 3 % (ref 0–5)
HCT: 51.1 % (ref 39.0–52.0)
HEMOGLOBIN: 16.1 g/dL (ref 13.0–17.0)
Lymphocytes Relative: 41 % (ref 12–46)
Lymphs Abs: 2.4 10*3/uL (ref 0.7–4.0)
MCH: 25.8 pg — ABNORMAL LOW (ref 26.0–34.0)
MCHC: 31.5 g/dL (ref 30.0–36.0)
MCV: 82 fL (ref 78.0–100.0)
Monocytes Absolute: 0.6 10*3/uL (ref 0.1–1.0)
Monocytes Relative: 10 % (ref 3–12)
NEUTROS ABS: 2.7 10*3/uL (ref 1.7–7.7)
NEUTROS PCT: 46 % (ref 43–77)
Platelets: 273 10*3/uL (ref 150–400)
RBC: 6.23 MIL/uL — ABNORMAL HIGH (ref 4.22–5.81)
RDW: 14.5 % (ref 11.5–15.5)
WBC: 5.8 10*3/uL (ref 4.0–10.5)

## 2014-09-13 LAB — COMPREHENSIVE METABOLIC PANEL
ALBUMIN: 4.4 g/dL (ref 3.5–5.2)
ALT: 22 U/L (ref 0–53)
AST: 17 U/L (ref 0–37)
Alkaline Phosphatase: 57 U/L (ref 39–117)
Anion gap: 8 (ref 5–15)
BUN: 13 mg/dL (ref 6–23)
CHLORIDE: 105 meq/L (ref 96–112)
CO2: 26 mmol/L (ref 19–32)
Calcium: 9 mg/dL (ref 8.4–10.5)
Creatinine, Ser: 1.15 mg/dL (ref 0.50–1.35)
GFR calc Af Amer: >90 mL/min (ref >90)
GFR calc non Af Amer: 78 mL/min — ABNORMAL LOW (ref >90)
GLUCOSE: 103 mg/dL — AB (ref 70–99)
POTASSIUM: 4.2 mmol/L (ref 3.5–5.1)
Sodium: 139 mmol/L (ref 135–145)
TOTAL PROTEIN: 8.3 g/dL (ref 6.0–8.3)
Total Bilirubin: 0.7 mg/dL (ref 0.3–1.2)

## 2014-09-13 LAB — LIPASE, BLOOD: LIPASE: 21 U/L (ref 11–59)

## 2014-09-13 MED ORDER — SODIUM CHLORIDE 0.9 % IV BOLUS (SEPSIS)
1000.0000 mL | Freq: Once | INTRAVENOUS | Status: AC
Start: 2014-09-13 — End: 2014-09-13
  Administered 2014-09-13: 1000 mL via INTRAVENOUS

## 2014-09-13 MED ORDER — LOPERAMIDE HCL 2 MG PO CAPS
4.0000 mg | ORAL_CAPSULE | Freq: Once | ORAL | Status: AC
  Administered 2014-09-13: 4 mg via ORAL
  Filled 2014-09-13: qty 2

## 2014-09-13 NOTE — ED Notes (Signed)
Pt c/o diarrhea x 2 days, generalized abdominal pain, nausea, 1 episode of emesis, feeling tired and weak.

## 2014-09-13 NOTE — Discharge Instructions (Signed)

## 2014-09-13 NOTE — ED Provider Notes (Signed)
CSN: [id number]     Arrival date & time 09/13/14  0755 History   First MD Initiated Contact with Patient 09/13/14 0800     Chief Complaint  Patient presents with  . Diarrhea     (Consider location/radiation/quality/duration/timing/severity/associated sxs/prior Treatment) Patient is a 39 y.o. male presenting with diarrhea. The history is provided by the patient.  Diarrhea Quality:  Watery Severity:  Severe Onset quality:  Gradual Number of episodes:  20 Duration:  2 days Timing:  Constant Progression:  Unchanged Relieved by:  Nothing Ineffective treatments:  Anti-motility medications Associated symptoms: abdominal pain (cramping) and vomiting   Associated symptoms: no fever   Risk factors: recent antibiotic use (Z-pack last week) and suspect food intake     Past Medical History  Diagnosis Date  . Fibromyalgia   . Glaucoma    Past Surgical History  Procedure Laterality Date  . Eye surgery    . Hernia repair     History reviewed. No pertinent family history. History  Substance Use Topics  . Smoking status: Never Smoker   . Smokeless tobacco: Not on file  . Alcohol Use: No    Review of Systems  Constitutional: Negative for fever.  Respiratory: Negative for cough and shortness of breath.   Gastrointestinal: Positive for nausea, vomiting, abdominal pain (cramping) and diarrhea.  Neurological: Positive for weakness.  All other systems reviewed and are negative.     Allergies  Citrus and Latex  Home Medications   Prior to Admission medications   Medication Sig Start Date End Date Taking? Authorizing Provider  brimonidine (ALPHAGAN) 0.2 % ophthalmic solution Place 1 drop into both eyes 2 (two) times daily.    Yes Historical Provider, MD  dorzolamide-timolol (COSOPT) 22.3-6.8 MG/ML ophthalmic solution Place 1 drop into both eyes 2 (two) times daily.   Yes Historical Provider, MD  DULoxetine (CYMBALTA) 30 MG capsule Take 30 mg by mouth daily.   Yes Historical  Provider, MD  gabapentin (NEURONTIN) 300 MG capsule Take 1 capsule (300 mg total) by mouth 3 (three) times daily. 11/03/12  Yes Robyn M Hess, PA-C  ibuprofen (ADVIL,MOTRIN) 800 MG tablet Take 1 tablet (800 mg total) by mouth 3 (three) times daily. 06/06/12  Yes Tia Oliveri, PA-C  travoprost, benzalkonium, (TRAVATAN) 0.004 % ophthalmic solution Place 1 drop into both eyes at bedtime.   Yes Historical Provider, MD  diazepam (VALIUM) 5 MG tablet Take 1 tablet (5 mg total) by mouth every 6 (six) hours as needed for anxiety (spasms). Patient not taking: Reported on 09/13/2014 07/25/14   Joni Reining Pisciotta, PA-C  oxyCODONE-acetaminophen (PERCOCET/ROXICET) 5-325 MG per tablet 1 to 2 tabs PO q6hrs  PRN for pain Patient not taking: Reported on 09/13/2014 07/25/14   Joni Reining Pisciotta, PA-C  predniSONE (DELTASONE) 50 MG tablet Take 1 tablet daily with breakfast Patient not taking: Reported on 09/13/2014 07/25/14   Joni Reining Pisciotta, PA-C   BP 122/82 mmHg  Pulse 98  Temp(Src) 98.3 F (36.8 C) (Oral)  Resp 16  SpO2 99% Physical Exam  Constitutional: He is oriented to person, place, and time. He appears well-developed and well-nourished. No distress.  HENT:  Head: Normocephalic and atraumatic.  Mouth/Throat: Oropharynx is clear and moist. No oropharyngeal exudate.  Eyes: EOM are normal. Pupils are equal, round, and reactive to light.  Neck: Normal range of motion. Neck supple.  Cardiovascular: Normal rate and regular rhythm.  Exam reveals no friction rub.   No murmur heard. Pulmonary/Chest: Effort normal and breath sounds normal. No respiratory distress.  He has no wheezes. He has no rales.  Abdominal: Soft. He exhibits no distension. There is tenderness (mild, diffuse upper). There is no rebound.  Musculoskeletal: Normal range of motion. He exhibits no edema.  Neurological: He is alert and oriented to person, place, and time. No cranial nerve deficit. He exhibits normal muscle tone. Coordination normal.  Skin:  No rash noted. He is not diaphoretic.  Nursing note and vitals reviewed.   ED Course  Procedures (including critical care time) Labs Review Labs Reviewed  CLOSTRIDIUM DIFFICILE BY PCR  CBC WITH DIFFERENTIAL  COMPREHENSIVE METABOLIC PANEL  LIPASE, BLOOD  I-STAT CG4 LACTIC ACID, ED    Imaging Review No results found.   EKG Interpretation None      MDM   Final diagnoses:  Diarrhea    39-year-old male here with diarrhea. States over 20 episodes in the past 2 days, no blood in the diarrhea. He believes this stems from [place] few days ago. No one else in the family is sick. He denies any fever, but he is having some nausea and some mild vomiting. On exam he has some mild upper abdominal tenderness but no lower abdominal tenderness. There are no peritoneal signs, no rebound, no guarding. Vitals are stable. He does report some mild weakness and dizziness. Will hydrate, check labs. I do not think CT scan is warranted at this time with mild upper abdominal tenderness. Labs ok. Feeling mildly better after fluids. Instructed to f/u with PCP, instructed to use anti-motility agents and f/u.   Elwin MochaBlair Hance Caspers, MD 09/14/14 [phone]

## 2014-10-29 DIAGNOSIS — S161XXA Strain of muscle, fascia and tendon at neck level, initial encounter: Secondary | ICD-10-CM | POA: Diagnosis not present

## 2014-11-06 ENCOUNTER — Emergency Department (HOSPITAL_COMMUNITY)
Admission: EM | Admit: 2014-11-06 | Discharge: 2014-11-06 | Disposition: A | Discharge status: Home / Self Care | Payer: Medicaid Other | Attending: Emergency Medicine | Admitting: Emergency Medicine

## 2014-11-06 ENCOUNTER — Encounter (HOSPITAL_COMMUNITY): Payer: Self-pay | Admitting: *Deleted

## 2014-11-06 DIAGNOSIS — Y9389 Activity, other specified: Secondary | ICD-10-CM | POA: Diagnosis not present

## 2014-11-06 DIAGNOSIS — Y9289 Other specified places as the place of occurrence of the external cause: Secondary | ICD-10-CM | POA: Insufficient documentation

## 2014-11-06 DIAGNOSIS — Z91040 Latex allergy status: Secondary | ICD-10-CM | POA: Insufficient documentation

## 2014-11-06 DIAGNOSIS — X58XXXA Exposure to other specified factors, initial encounter: Secondary | ICD-10-CM | POA: Insufficient documentation

## 2014-11-06 DIAGNOSIS — Z8739 Personal history of other diseases of the musculoskeletal system and connective tissue: Secondary | ICD-10-CM | POA: Diagnosis not present

## 2014-11-06 DIAGNOSIS — Z8669 Personal history of other diseases of the nervous system and sense organs: Secondary | ICD-10-CM | POA: Diagnosis not present

## 2014-11-06 DIAGNOSIS — S46911A Strain of unspecified muscle, fascia and tendon at shoulder and upper arm level, right arm, initial encounter: Secondary | ICD-10-CM | POA: Diagnosis not present

## 2014-11-06 DIAGNOSIS — S46811A Strain of other muscles, fascia and tendons at shoulder and upper arm level, right arm, initial encounter: Secondary | ICD-10-CM | POA: Diagnosis not present

## 2014-11-06 DIAGNOSIS — Z791 Long term (current) use of non-steroidal anti-inflammatories (NSAID): Secondary | ICD-10-CM | POA: Diagnosis not present

## 2014-11-06 DIAGNOSIS — M25511 Pain in right shoulder: Secondary | ICD-10-CM | POA: Diagnosis present

## 2014-11-06 DIAGNOSIS — Z79899 Other long term (current) drug therapy: Secondary | ICD-10-CM | POA: Insufficient documentation

## 2014-11-06 DIAGNOSIS — Y998 Other external cause status: Secondary | ICD-10-CM | POA: Diagnosis not present

## 2014-11-06 DIAGNOSIS — T148XXA Other injury of unspecified body region, initial encounter: Secondary | ICD-10-CM

## 2014-11-06 DIAGNOSIS — Z7952 Long term (current) use of systemic steroids: Secondary | ICD-10-CM | POA: Insufficient documentation

## 2014-11-06 MED ORDER — NAPROXEN 500 MG PO TABS: 500.0000 mg | ORAL_TABLET | Freq: Two times a day (BID) | ORAL | Status: DC

## 2014-11-06 MED ORDER — DIAZEPAM 5 MG PO TABS
5.0000 mg | ORAL_TABLET | Freq: Once | ORAL | Status: AC
  Administered 2014-11-06: 5 mg via ORAL
  Filled 2014-11-06: qty 1

## 2014-11-06 MED ORDER — KETOROLAC TROMETHAMINE 60 MG/2ML IM SOLN
60.0000 mg | Freq: Once | INTRAMUSCULAR | Status: AC
  Administered 2014-11-06: 60 mg via INTRAMUSCULAR
  Filled 2014-11-06: qty 2

## 2014-11-06 NOTE — ED Notes (Signed)
Pt reports lifting television 2-3 days ago and hurt right shoulder.

## 2014-11-06 NOTE — ED Provider Notes (Signed)
CSN: [id number]     Arrival date & time 11/06/14  [id number] History   First MD Initiated Contact with Patient 11/06/14 [phone]     Chief Complaint  Patient presents with  . Shoulder Pain     (Consider location/radiation/quality/duration/timing/severity/associated sxs/prior Treatment) HPI Bryce [name] is a 39 y.o. male history of fibromyalgia who comes in for evaluation of right shoulder pain. Patient states a few days ago he was helping to move an old TV and when he set it down he experienced a "twinge" in his right shoulder. He denies any direct trauma to his shoulder. Denies any numbness or weakness. He is tried Tylenol, ibuprofen, ice and heat wraps with minimal relief. He rates his discomfort as a 7/10. Denies numbness or weakness, rash, wound or cool extremities. No other aggravating or modifying factors. No fevers, chest pain, shortness of breath  Past Medical History  Diagnosis Date  . Fibromyalgia   . Glaucoma    Past Surgical History  Procedure Laterality Date  . Eye surgery    . Hernia repair     No family history on file. History  Substance Use Topics  . Smoking status: Never Smoker   . Smokeless tobacco: Not on file  . Alcohol Use: No    Review of Systems A 10 point review of systems was completed and was negative except for pertinent positives and negatives as mentioned in the history of present illness     Allergies  Citrus and Latex  Home Medications   Prior to Admission medications   Medication Sig Start Date End Date Taking? Authorizing Provider  brimonidine (ALPHAGAN) 0.2 % ophthalmic solution Place 1 drop into both eyes 2 (two) times daily.     Historical Provider, MD  diazepam (VALIUM) 5 MG tablet Take 1 tablet (5 mg total) by mouth every 6 (six) hours as needed for anxiety (spasms). Patient not taking: Reported on 09/13/2014 07/25/14   Joni ReiningNicole Pisciotta, PA-C  dorzolamide-timolol (COSOPT) 22.3-6.8 MG/ML ophthalmic solution Place 1 drop into both eyes 2 (two)  times daily.    Historical Provider, MD  DULoxetine (CYMBALTA) 30 MG capsule Take 30 mg by mouth daily.    Historical Provider, MD  gabapentin (NEURONTIN) 300 MG capsule Take 1 capsule (300 mg total) by mouth 3 (three) times daily. 11/03/12   Kathrynn Speedobyn M Hess, PA-C  ibuprofen (ADVIL,MOTRIN) 800 MG tablet Take 1 tablet (800 mg total) by mouth 3 (three) times daily. 06/06/12   Tia Oliveri, PA-C  naproxen (NAPROSYN) 500 MG tablet Take 1 tablet (500 mg total) by mouth 2 (two) times daily. 11/06/14   Sharlene MottsBenjamin W Irisha Grandmaison, PA-C  oxyCODONE-acetaminophen (PERCOCET/ROXICET) 5-325 MG per tablet 1 to 2 tabs PO q6hrs  PRN for pain Patient not taking: Reported on 09/13/2014 07/25/14   Joni ReiningNicole Pisciotta, PA-C  predniSONE (DELTASONE) 50 MG tablet Take 1 tablet daily with breakfast Patient not taking: Reported on 09/13/2014 07/25/14   Joni ReiningNicole Pisciotta, PA-C  travoprost, benzalkonium, (TRAVATAN) 0.004 % ophthalmic solution Place 1 drop into both eyes at bedtime.    Historical Provider, MD   BP 120/75 mmHg  Pulse 85  Temp(Src) 98 F (36.7 C) (Oral)  Resp 16  SpO2 98% Physical Exam  Constitutional:  Awake, alert, nontoxic appearance.  HENT:  Head: Atraumatic.  Eyes: Right eye exhibits no discharge. Left eye exhibits no discharge.  Neck: Neck supple.  Pulmonary/Chest: Effort normal. He exhibits no tenderness.  Abdominal: Soft. There is no tenderness. There is no rebound.  Musculoskeletal: He exhibits  no tenderness.  Baseline ROM, no obvious new focal weakness. Maintains full active range of motion of bilateral upper extremities. Distal pulses intact. Diffuse tenderness over the right anterior deltoid. No overt warmth, erythema or edema.  Neurological:  Mental status and motor strength appears baseline for patient and situation.  Skin: No rash noted.  Psychiatric: He has a normal mood and affect.  Nursing note and vitals reviewed.   ED Course  Procedures (including critical care time) Labs Review Labs Reviewed -  No data to display  Imaging Review No results found.   EKG Interpretation None     Meds given in ED:  Medications  ketorolac (TORADOL) injection 60 mg (60 mg Intramuscular Given 11/06/14 0832)  diazepam (VALIUM) tablet 5 mg (5 mg Oral Given 11/06/14 0832)    Discharge Medication List as of 11/06/2014  8:24 AM    START taking these medications   Details  naproxen (NAPROSYN) 500 MG tablet Take 1 tablet (500 mg total) by mouth 2 (two) times daily., Starting 11/06/2014, Until Discontinued, Print       Filed Vitals:   11/06/14 0741 11/06/14 0837  BP: 124/79 120/75  Pulse: 89 85  Temp: 98 F (36.7 C)   TempSrc: Oral   Resp: 16 16  SpO2: 98% 98%    MDM  Vitals stable - WNL -afebrile Pt resting comfortably in ED. PE--not concerning further acute or emergent pathology.  DDX--patient likely with deltoid muscle strain. Maintains full active range of motion and is neurovascularly intact. No evidence of hemarthrosis, septic joint or other vascular or neurologic compromise. Will DC with anti-inflammatories, Rice therapy and follow-up with PCP. I discussed all relevant lab findings and imaging results with pt and they verbalized understanding. Discussed f/u with PCP within 48 hrs and return precautions, pt very amenable to plan.  Final diagnoses:  Muscle strain       Earle Gell [place], PA-C 11/06/14 1647  Toy Cookey, MD 11/07/14 [phone]

## 2014-11-06 NOTE — Discharge Instructions (Signed)
Muscle Strain A muscle strain (pulled muscle) happens when a muscle is stretched beyond normal length. It happens when a sudden, violent force stretches your muscle too far. Usually, a few of the fibers in your muscle are torn. Muscle strain is common in athletes. Recovery usually takes 1-2 weeks. Complete healing takes 5-6 weeks.  HOME CARE   Follow the PRICE method of treatment to help your injury get better. Do this the first 2-3 days after the injury:  Protect. Protect the muscle to keep it from getting injured again.  Rest. Limit your activity and rest the injured body part.  Ice. Put ice in a plastic bag. Place a towel between your skin and the bag. Then, apply the ice and leave it on from 15-20 minutes each hour. After the third day, switch to moist heat packs.  Compression. Use a splint or elastic bandage on the injured area for comfort. Do not put it on too tightly.  Elevate. Keep the injured body part above the level of your heart.  Only take medicine as told by your doctor.  Warm up before doing exercise to prevent future muscle strains. GET HELP IF:   You have more pain or puffiness (swelling) in the injured area.  You feel numbness, tingling, or notice a loss of strength in the injured area. MAKE SURE YOU:   Understand these instructions.  Will watch your condition.  Will get help right away if you are not doing well or get worse. Document Released: 06/02/2008 Document Revised: 06/14/2013 Document Reviewed: 03/23/2013 [facility] Patient Information 2015 [place], [place]. This information is not intended to replace advice given to you by your health care provider. Make sure you discuss any questions you have with your health care provider.  Cryotherapy Cryotherapy means treatment with cold. Ice or gel packs can be used to reduce both pain and swelling. Ice is the most helpful within the first 24 to 48 hours after an injury or flare-up from overusing a muscle or joint.  Sprains, strains, spasms, burning pain, shooting pain, and aches can all be eased with ice. Ice can also be used when recovering from surgery. Ice is effective, has very few side effects, and is safe for most people to use. PRECAUTIONS  Ice is not a safe treatment option for people with:  Raynaud phenomenon. This is a condition affecting small blood vessels in the extremities. Exposure to cold may cause your problems to return.  Cold hypersensitivity. There are many forms of cold hypersensitivity, including:  Cold urticaria. Red, itchy hives appear on the skin when the tissues begin to warm after being iced.  Cold erythema. This is a red, itchy rash caused by exposure to cold.  Cold hemoglobinuria. Red blood cells break down when the tissues begin to warm after being iced. The hemoglobin that carry oxygen are passed into the urine because they cannot combine with blood proteins fast enough.  Numbness or altered sensitivity in the area being iced. If you have any of the following conditions, do not use ice until you have discussed cryotherapy with your caregiver:  Heart conditions, such as arrhythmia, angina, or chronic heart disease.  High blood pressure.  Healing wounds or open skin in the area being iced.  Current infections.  Rheumatoid arthritis.  Poor circulation.  Diabetes. Ice slows the blood flow in the region it is applied. This is beneficial when trying to stop inflamed tissues from spreading irritating chemicals to surrounding tissues. However, if you expose your skin to  cold temperatures for too long or without the proper protection, you can damage your skin or nerves. Watch for signs of skin damage due to cold. HOME CARE INSTRUCTIONS Follow these tips to use ice and cold packs safely.  Place a dry or damp towel between the ice and skin. A damp towel will cool the skin more quickly, so you may need to shorten the time that the ice is used.  For a more rapid response,  add gentle compression to the ice.  Ice for no more than 10 to 20 minutes at a time. The bonier the area you are icing, the less time it will take to get the benefits of ice.  Check your skin after 5 minutes to make sure there are no signs of a poor response to cold or skin damage.  Rest 20 minutes or more between uses.  Once your skin is numb, you can end your treatment. You can test numbness by very lightly touching your skin. The touch should be so light that you do not see the skin dimple from the pressure of your fingertip. When using ice, most people will feel these normal sensations in this order: cold, burning, aching, and numbness.  Do not use ice on someone who cannot communicate their responses to pain, such as small children or people with dementia. HOW TO MAKE AN ICE PACK Ice packs are the most common way to use ice therapy. Other methods include ice massage, ice baths, and cryosprays. Muscle creams that cause a cold, tingly feeling do not offer the same benefits that ice offers and should not be used as a substitute unless recommended by your caregiver. To make an ice pack, do one of the following:  Place crushed ice or a bag of frozen vegetables in a sealable plastic bag. Squeeze out the excess air. Place this bag inside another plastic bag. Slide the bag into a pillowcase or place a damp towel between your skin and the bag.  Mix 3 parts water with 1 part rubbing alcohol. Freeze the mixture in a sealable plastic bag. When you remove the mixture from the freezer, it will be slushy. Squeeze out the excess air. Place this bag inside another plastic bag. Slide the bag into a pillowcase or place a damp towel between your skin and the bag. SEEK MEDICAL CARE IF:  You develop white spots on your skin. This may give the skin a blotchy (mottled) appearance.  Your skin turns blue or pale.  Your skin becomes waxy or hard.  Your swelling gets worse. MAKE SURE YOU:   Understand these  instructions.  Will watch your condition.  Will get help right away if you are not doing well or get worse. Document Released: 04/20/2011 Document Revised: 01/08/2014 Document Reviewed: 04/20/2011 [facility] Patient Information 2015 [place]. This information is not intended to replace advice given to you by your health care provider. Make sure you discuss any questions you have with your health care provider.

## 2014-11-12 DIAGNOSIS — H4011X3 Primary open-angle glaucoma, severe stage: Secondary | ICD-10-CM | POA: Diagnosis not present

## 2015-02-08 DIAGNOSIS — J00 Acute nasopharyngitis [common cold]: Secondary | ICD-10-CM | POA: Diagnosis not present

## 2015-02-08 DIAGNOSIS — H6091 Unspecified otitis externa, right ear: Secondary | ICD-10-CM | POA: Diagnosis not present

## 2015-03-14 ENCOUNTER — Ambulatory Visit (INDEPENDENT_AMBULATORY_CARE_PROVIDER_SITE_OTHER): Payer: Medicare Other | Admitting: Internal Medicine

## 2015-03-14 ENCOUNTER — Encounter: Payer: Self-pay | Admitting: Internal Medicine

## 2015-03-14 VITALS — BP 128/84 | HR 76 | Temp 98.3°F | Wt 270.0 lb

## 2015-03-14 DIAGNOSIS — H409 Unspecified glaucoma: Secondary | ICD-10-CM | POA: Diagnosis not present

## 2015-03-14 DIAGNOSIS — Z23 Encounter for immunization: Secondary | ICD-10-CM

## 2015-03-14 DIAGNOSIS — H4011X3 Primary open-angle glaucoma, severe stage: Secondary | ICD-10-CM | POA: Diagnosis not present

## 2015-03-14 DIAGNOSIS — M797 Fibromyalgia: Secondary | ICD-10-CM

## 2015-03-14 DIAGNOSIS — H548 Legal blindness, as defined in USA: Secondary | ICD-10-CM | POA: Diagnosis not present

## 2015-03-14 MED ORDER — DULOXETINE HCL 30 MG PO CPEP: 30.0000 mg | ORAL_CAPSULE | Freq: Every day | ORAL | Status: DC

## 2015-03-14 MED ORDER — GABAPENTIN 300 MG PO CAPS: 300.0000 mg | ORAL_CAPSULE | Freq: Three times a day (TID) | ORAL | Status: DC

## 2015-03-14 NOTE — Progress Notes (Signed)
Pre visit review using our clinic review tool, if applicable. No additional management support is needed unless otherwise documented below in the visit note. 

## 2015-03-14 NOTE — Patient Instructions (Signed)
We will not change the medicines today. We have sent in refills.   If you are having a flare of fibromyalgia consider taking 2 of the cymbalta for the whole flare to help it end quicker and not hurt as much.   Fall Prevention and Home Safety Falls cause injuries and can affect all age groups. It is possible to use preventive measures to significantly decrease the likelihood of falls. There are many simple measures which can make your home safer and prevent falls. OUTDOORS  Repair cracks and edges of walkways and driveways.  Remove high doorway thresholds.  Trim shrubbery on the main path into your home.  Have good outside lighting.  Clear walkways of tools, rocks, debris, and clutter.  Check that handrails are not broken and are securely fastened. Both sides of steps should have handrails.  Have leaves, snow, and ice cleared regularly.  Use sand or salt on walkways during winter months.  In the garage, clean up grease or oil spills. BATHROOM  Install night lights.  Install grab bars by the toilet and in the tub and shower.  Use non-skid mats or decals in the tub or shower.  Place a plastic non-slip stool in the shower to sit on, if needed.  Keep floors dry and clean up all water on the floor immediately.  Remove soap buildup in the tub or shower on a regular basis.  Secure bath mats with non-slip, double-sided rug tape.  Remove throw rugs and tripping hazards from the floors. BEDROOMS  Install night lights.  Make sure a bedside light is easy to reach.  Do not use oversized bedding.  Keep a telephone by your bedside.  Have a firm chair with side arms to use for getting dressed.  Remove throw rugs and tripping hazards from the floor. KITCHEN  Keep handles on pots and pans turned toward the center of the stove. Use back burners when possible.  Clean up spills quickly and allow time for drying.  Avoid walking on wet floors.  Avoid hot utensils and  knives.  Position shelves so they are not too high or low.  Place commonly used objects within easy reach.  If necessary, use a sturdy step stool with a grab bar when reaching.  Keep electrical cables out of the way.  Do not use floor polish or wax that makes floors slippery. If you must use wax, use non-skid floor wax.  Remove throw rugs and tripping hazards from the floor. STAIRWAYS  Never leave objects on stairs.  Place handrails on both sides of stairways and use them. Fix any loose handrails. Make sure handrails on both sides of the stairways are as long as the stairs.  Check carpeting to make sure it is firmly attached along stairs. Make repairs to worn or loose carpet promptly.  Avoid placing throw rugs at the top or bottom of stairways, or properly secure the rug with carpet tape to prevent slippage. Get rid of throw rugs, if possible.  Have an electrician put in a light switch at the top and bottom of the stairs. OTHER FALL PREVENTION TIPS  Wear low-heel or rubber-soled shoes that are supportive and fit well. Wear closed toe shoes.  When using a stepladder, make sure it is fully opened and both spreaders are firmly locked. Do not climb a closed stepladder.  Add color or contrast paint or tape to grab bars and handrails in your home. Place contrasting color strips on first and last steps.  Learn and  use mobility aids as needed. Install an electrical emergency response system.  Turn on lights to avoid dark areas. Replace light bulbs that burn out immediately. Get light switches that glow.  Arrange furniture to create clear pathways. Keep furniture in the same place.  Firmly attach carpet with non-skid or double-sided tape.  Eliminate uneven floor surfaces.  Select a carpet pattern that does not visually hide the edge of steps.  Be aware of all pets. OTHER HOME SAFETY TIPS  Set the water temperature for 120 F (48.8 C).  Keep emergency numbers on or near the  telephone.  Keep smoke detectors on every level of the home and near sleeping areas. Document Released: 08/14/2002 Document Revised: 02/23/2012 Document Reviewed: 11/13/2011 [facility] Patient Information 2015 [place], [place]. This information is not intended to replace advice given to you by your health care provider. Make sure you discuss any questions you have with your health care provider.

## 2015-03-15 DIAGNOSIS — H409 Unspecified glaucoma: Secondary | ICD-10-CM | POA: Insufficient documentation

## 2015-03-15 DIAGNOSIS — H548 Legal blindness, as defined in USA: Secondary | ICD-10-CM | POA: Insufficient documentation

## 2015-03-15 DIAGNOSIS — M797 Fibromyalgia: Secondary | ICD-10-CM | POA: Insufficient documentation

## 2015-03-15 NOTE — Progress Notes (Signed)
   Subjective:    Patient ID: Bryce [name], male    DOB: [birth year], 39 y.o.   MRN: [id number]  HPI The patient is a 39  YO man who is coming in for his fibromyalgia. He has been struggling with it for about 20 years. He is currently taking cymbalta daily but feels that it makes him feel a little bit like a zombie. He is not on optimal dosing and has not tried increasing. He has not had a true flare in about 6-8 months. He generally requires an injection for his flares and they last about 4-5 days. The cymbalta is only doing an okay job and he struggles with pain most days. He has had an eye surgery recently and this has limited his physical ability which has worsened his symptoms overall. His vision is very poor and he has central vision for shapes only.   [facility], social history reviewed and updated.   Review of Systems  Constitutional: Positive for activity change. Negative for fever, appetite change, fatigue and unexpected weight change.  HENT: Negative.   Eyes: Negative.   Respiratory: Negative for cough, chest tightness, shortness of breath and wheezing.   Cardiovascular: Negative for chest pain, palpitations and leg swelling.  Gastrointestinal: Positive for diarrhea and constipation. Negative for abdominal pain and abdominal distention.       Alternating and caused by his eye drops per him  Musculoskeletal: Positive for myalgias and arthralgias. Negative for back pain, joint swelling and gait problem.  Skin: Negative.   Neurological: Negative.   Psychiatric/Behavioral: Negative.       Objective:   Physical Exam  Constitutional: He is oriented to person, place, and time. He appears well-developed and well-nourished.  HENT:  Head: Normocephalic and atraumatic.  Eyes:  Vision limited, sees only central vision with no periphery  Neck: Normal range of motion.  Cardiovascular: Normal rate and regular rhythm.   Pulmonary/Chest: Effort normal. No respiratory distress. He has no  wheezes. He has no rales.  Abdominal: Soft. He exhibits no distension. There is no tenderness.  Musculoskeletal: He exhibits no edema.  Neurological: He is alert and oriented to person, place, and time. Coordination normal.  Slow careful gait  Skin: Skin is warm and dry.  Psychiatric: He has a normal mood and affect.   Filed Vitals:   03/14/15 1601  BP: 128/84  Pulse: 76  Temp: 98.3 F (36.8 C)  TempSrc: Oral  Weight: 270 lb (122.471 kg)  SpO2: 98%      Assessment & Plan:  Tdap and Prevnar given at visit.

## 2015-03-15 NOTE — Assessment & Plan Note (Signed)
With only minimal vision, shapes and central vision only.

## 2015-03-15 NOTE — Assessment & Plan Note (Signed)
This is the cause of his blindness and he has many stents etc in each eye to help but vision is extremely poor.

## 2015-03-15 NOTE — Assessment & Plan Note (Signed)
He is hesitant to increase the cymbalta to 60 mg daily since he is already having some side effects. Have asked him to take 60 mg daily during flare to see if it helps it to resolve quicker. Also uses ibuprofen and tylenol to supplement. Not in flare today.

## 2015-04-23 ENCOUNTER — Encounter: Payer: Self-pay | Admitting: Internal Medicine

## 2015-04-23 ENCOUNTER — Ambulatory Visit (INDEPENDENT_AMBULATORY_CARE_PROVIDER_SITE_OTHER): Payer: Medicare Other | Admitting: Internal Medicine

## 2015-04-23 VITALS — BP 114/84 | HR 86 | Temp 98.5°F | Ht 74.0 in | Wt 272.0 lb

## 2015-04-23 DIAGNOSIS — H9201 Otalgia, right ear: Secondary | ICD-10-CM | POA: Insufficient documentation

## 2015-04-23 MED ORDER — LEVOFLOXACIN 500 MG PO TABS: 500.0000 mg | ORAL_TABLET | Freq: Every day | ORAL | Status: DC

## 2015-04-23 NOTE — Assessment & Plan Note (Signed)
C/w prob uri/right otitis media - for levaquin asd, alleve prn,  to f/u any worsening symptoms or concerns

## 2015-04-23 NOTE — Progress Notes (Signed)
Subjective:    Patient ID: Bryce [name], male    DOB: 02-Jan-1976, 39 y.o.   MRN: [id number]  HPI  Here with acute onset right ear pain, with swelling, tender, pain to the area below the ear at the angle of the jaw, slight ST, HA and general malaise and weakness. No high fevers or drainage.  Pt denies chest pain, increased sob or doe, wheezing, orthopnea, PND, increased LE swelling, palpitations, dizziness or syncope.  Pt denies new neurological symptoms such as new facial or extremity weakness or numbness  Past Medical History  Diagnosis Date  . Fibromyalgia   . Glaucoma    Past Surgical History  Procedure Laterality Date  . Eye surgery    . Hernia repair      reports that he has never smoked. He does not have any smokeless tobacco history on file. He reports that he does not drink alcohol or use illicit drugs. family history is not on file. Allergies  Allergen Reactions  . Citrus Anaphylaxis  . Latex Rash   Current Outpatient Prescriptions on File Prior to Visit  Medication Sig Dispense Refill  . brimonidine (ALPHAGAN) 0.2 % ophthalmic solution Place 1 drop into both eyes 2 (two) times daily.     . diazepam (VALIUM) 5 MG tablet Take 1 tablet (5 mg total) by mouth every 6 (six) hours as needed for anxiety (spasms). 10 tablet 0  . dorzolamide-timolol (COSOPT) 22.3-6.8 MG/ML ophthalmic solution Place 1 drop into both eyes 2 (two) times daily.    . DULoxetine (CYMBALTA) 30 MG capsule Take 1 capsule (30 mg total) by mouth daily. 90 capsule 3  . gabapentin (NEURONTIN) 300 MG capsule Take 1 capsule (300 mg total) by mouth 3 (three) times daily. 270 capsule 3  . ibuprofen (ADVIL,MOTRIN) 800 MG tablet Take 1 tablet (800 mg total) by mouth 3 (three) times daily. 21 tablet 0  . naproxen (NAPROSYN) 500 MG tablet Take 1 tablet (500 mg total) by mouth 2 (two) times daily. 30 tablet 0  . oxyCODONE-acetaminophen (PERCOCET/ROXICET) 5-325 MG per tablet 1 to 2 tabs PO q6hrs  PRN for pain 15 tablet 0    . travoprost, benzalkonium, (TRAVATAN) 0.004 % ophthalmic solution Place 1 drop into both eyes at bedtime.     No current facility-administered medications on file prior to visit.   Review of Systems  Constitutional: Negative for unusual diaphoresis or night sweats HENT: Negative for ringing in ear or discharge Eyes: Negative for double vision or worsening visual disturbance.  Respiratory: Negative for choking and stridor.   Gastrointestinal: Negative for vomiting or other signifcant bowel change Genitourinary: Negative for hematuria or change in urine volume.  Musculoskeletal: Negative for other MSK pain or swelling Skin: Negative for color change and worsening wound.  Neurological: Negative for tremors and numbness other than noted  Psychiatric/Behavioral: Negative for decreased concentration or agitation other than above       Objective:   Physical Exam BP 114/84 mmHg  Pulse 86  Temp(Src) 98.5 F (36.9 C) (Oral)  Ht 6\' 2"  (1.88 m)  Wt 272 lb (123.378 kg)  BMI 34.91 kg/m2  SpO2 97% VS noted, mild ill Constitutional: Pt appears in no significant distress HENT: Head: NCAT.  Right Ear: External ear normal.  Left Ear: External ear normal.  Eyes: . Pupils are equal, round, and reactive to light. Conjunctivae and EOM are normal Bilat tm's with mild erythema.  Max sinus areas mild tender.  Pharynx with mild erythema, no exudate  Neck: Normal range of motion. Neck supple. but with right mod to large mult lyphadenitis to angle of jaw and submandibular Cardiovascular: Normal rate and regular rhythm.   Pulmonary/Chest: Effort normal and breath sounds without rales or wheezing.  Neurological: Pt is alert. Not confused , motor grossly intact Skin: Skin is warm. No rash, no LE edema Psychiatric: Pt behavior is normal. No agitation.     Assessment & Plan:

## 2015-04-23 NOTE — Patient Instructions (Signed)
Please take all new medication as prescribed - the antibiotic  Please continue all other medications as before, and refills have been done if requested.  Please have the pharmacy call with any other refills you may need.  Please keep your appointments with your specialists as you may have planned   

## 2015-04-23 NOTE — Progress Notes (Signed)
Pre visit review using our clinic review tool, if applicable. No additional management support is needed unless otherwise documented below in the visit note. 

## 2015-06-17 DIAGNOSIS — H401133 Primary open-angle glaucoma, bilateral, severe stage: Secondary | ICD-10-CM | POA: Diagnosis not present

## 2015-06-19 DIAGNOSIS — Z711 Person with feared health complaint in whom no diagnosis is made: Secondary | ICD-10-CM | POA: Diagnosis not present

## 2015-06-21 DIAGNOSIS — H1045 Other chronic allergic conjunctivitis: Secondary | ICD-10-CM | POA: Diagnosis not present

## 2015-09-13 ENCOUNTER — Ambulatory Visit: Payer: Medicare Other | Admitting: Internal Medicine

## 2015-11-08 ENCOUNTER — Ambulatory Visit (INDEPENDENT_AMBULATORY_CARE_PROVIDER_SITE_OTHER): Payer: Medicare Other | Admitting: Family

## 2015-11-08 ENCOUNTER — Encounter: Payer: Self-pay | Admitting: Family

## 2015-11-08 VITALS — BP 118/78 | HR 86 | Temp 97.6°F | Resp 14 | Ht 74.0 in | Wt 273.0 lb

## 2015-11-08 DIAGNOSIS — M25561 Pain in right knee: Secondary | ICD-10-CM | POA: Diagnosis not present

## 2015-11-08 DIAGNOSIS — M79671 Pain in right foot: Secondary | ICD-10-CM

## 2015-11-08 MED ORDER — IBUPROFEN 800 MG PO TABS: 800.0000 mg | ORAL_TABLET | Freq: Three times a day (TID) | ORAL | Status: DC

## 2015-11-08 MED ORDER — DICLOFENAC SODIUM 2 % TD SOLN: 1.0000 "application " | Freq: Two times a day (BID) | TRANSDERMAL | Status: DC | PRN

## 2015-11-08 NOTE — Assessment & Plan Note (Signed)
Symptoms and exam consistent with plantar fasciitis. Treat conservatively with ice and home exercise therapy. Follow-up if symptoms worsen or fail to improve for possible cortisone injection.

## 2015-11-08 NOTE — Assessment & Plan Note (Signed)
Symptoms and exam consistent with meniscal pathology with questionable meniscal tear noted on ultrasound.  Treat conservatively with ice and home exercise therapy. Sample of Pennsaid provided. Refill ibuprofen. Follow up in 3 weeks or sooner if needed for potential advanced imaging.

## 2015-11-08 NOTE — Progress Notes (Signed)
Pre visit review using our clinic review tool, if applicable. No additional management support is needed unless otherwise documented below in the visit note. 

## 2015-11-08 NOTE — Patient Instructions (Addendum)
Thank you for choosing [facility].  Summary/Instructions:  Ice 2-3 times per day and after activity. Pennsaid - 2x daily as needed about pinkie dose Ibuprofen - 3x daily as needed. Home exercises daily Knee sleeve as needed.  Your prescription(s) have been submitted to your pharmacy or been printed and provided for you. Please take as directed and contact our office if you believe you are having problem(s) with the medication(s) or have any questions.  If your symptoms worsen or fail to improve, please contact our office for further instruction, or in case of emergency go directly to the emergency room at the closest medical facility.    Meniscus Tear With Phase I Rehab The meniscus is a C-shaped cartilage structure, located in the knee joint between the thigh bone (femur) and the shinbone (tibia). Two menisci are located in each knee joint: the inner and outer meniscus. The meniscus acts as an adapter between the thigh bone and shinbone, allowing them to fit properly together. It also functions as a shock absorber, to reduce the stress placed on the knee joint and to help supply nutrients to the knee joint cartilage. As people age, the meniscus begins to harden and become more vulnerable to injury. Meniscus tears are a common injury, especially in older athletes. Inner meniscus tears are more common than outer meniscus tears.  SYMPTOMS   Pain in the knee, especially with standing or squatting with the affected leg.  Tenderness along the joint line.  Swelling in the knee joint (effusion), usually starting 1 to 2 days after injury.  Locking or catching of the knee joint, causing inability to straighten the knee completely.  Giving way or buckling of the knee. CAUSES  A meniscus tear occurs when a force is placed on the meniscus that is greater than it can handle. Common causes of injury include:  Direct hit (trauma) to the knee.  Twisting, pivoting, or cutting (rapidly  changing direction while running), kneeling or squatting.  Without injury, due to aging. RISK INCREASES WITH:  Contact sports (football, rugby).  Sports in which cleats are used with pivoting (soccer, lacrosse) or sports in which good shoe grip and sudden change in direction are required (racquetball, basketball, squash).  Previous knee injury.  Associated knee injury, particularly ligament injuries.  Poor strength and flexibility. PREVENTION  Warm up and stretch properly before activity.  Maintain physical fitness:  Strength, flexibility, and endurance.  Cardiovascular fitness.  Protect the knee with a brace or elastic bandage.  Wear properly fitted protective equipment (proper cleats for the surface). PROGNOSIS  Sometimes, meniscus tears heal on their own. However, definitive treatment requires surgery, followed by at least 6 weeks of recovery.  RELATED COMPLICATIONS   Recurring symptoms that result in a chronic problem.  Repeated knee injury, especially if sports are resumed too soon after injury or surgery.  Progression of the tear (the tear gets larger), if untreated.  Arthritis of the knee in later years (with or without surgery).  Complications of surgery, including infection, bleeding, injury to nerves (numbness, weakness, paralysis) continued pain, giving way, locking, nonhealing of meniscus (if repaired), need for further surgery, and knee stiffness (loss of motion). TREATMENT  Treatment first involves the use of ice and medicine, to reduce pain and inflammation. You may find using crutches to walk more comfortable. However, it is okay to bear weight on the injured knee, if the pain will allow it. Surgery is often advised as a definitive treatment. Surgery is performed through an incision  near the joint (arthroscopically). The torn piece of the meniscus is removed, and if possible the joint cartilage is repaired. After surgery, the joint must be restrained. After  restraint, it is important to perform strengthening and stretching exercises to help regain strength and a full range of motion. These exercises may be completed at home or with a therapist.  MEDICATION  If pain medicine is needed, nonsteroidal anti-inflammatory medicines (aspirin and ibuprofen), or other minor pain relievers (acetaminophen), are often advised.  Do not take pain medicine for 7 days before surgery.  Prescription pain relievers may be given, if your caregiver thinks they are needed. Use only as directed and only as much as you need. HEAT AND COLD  Cold treatment (icing) should be applied for 10 to 15 minutes every 2 to 3 hours for inflammation and pain, and immediately after activity that aggravates your symptoms. Use ice packs or an ice massage.  Heat treatment may be used before performing stretching and strengthening activities prescribed by your caregiver, physical therapist, or athletic trainer. Use a heat pack or a warm water soak. SEEK MEDICAL CARE IF:   Symptoms get worse or do not improve in 2 weeks, despite treatment.  New, unexplained symptoms develop. (Drugs used in treatment may produce side effects.) EXERCISES RANGE OF MOTION (ROM) AND STRETCHING EXERCISES - Meniscus Tear, Non-operative, Phase I These are some of the initial exercises with which you may start your rehabilitation program, until you see your caregiver again or until your symptoms are resolved. Remember:   These initial exercises are intended to be gentle. They will help you restore motion without increasing any swelling.  Completing these exercises allows less painful movement and prepares you for the more aggressive strengthening exercises in Phase II.  An effective stretch should be held for at least 30 seconds.  A stretch should never be painful. You should only feel a gentle lengthening or release in the stretched tissue. RANGE OF MOTION - Knee Flexion, Active  Lie on your back with both  knees straight. (If this causes back discomfort, bend your healthy knee, placing your foot flat on the floor.)  Slowly slide your heel back toward your buttocks until you feel a gentle stretch in the front of your knee or thigh.  Hold for __________ seconds. Slowly slide your heel back to the starting position. Repeat __________ times. Complete this exercise __________ times per day.  RANGE OF MOTION - Knee Flexion and Extension, Active-Assisted  Sit on the edge of a table or chair with your thighs firmly supported. It may be helpful to place a folded towel under the end of your right / left thigh.  Flexion (bending): Place the ankle of your healthy leg on top of the other ankle. Use your healthy leg to gently bend your right / left knee until you feel a mild tension across the top of your knee.  Hold for __________ seconds.  Extension (straightening): Switch your ankles so your right / left leg is on top. Use your healthy leg to straighten your right / left knee until you feel a mild tension on the backside of your knee.  Hold for __________ seconds. Repeat __________ times. Complete __________ times per day. STRETCH - Knee Flexion, Supine  Lie on the floor with your right / left heel and foot lightly touching the wall. (Place both feet on the wall if you do not use a door frame.)  Without using any effort, allow gravity to slide your foot down  the wall slowly until you feel a gentle stretch in the front of your right / left knee.  Hold this stretch for __________ seconds. Then return the leg to the starting position, using your healthy leg for help, if needed. Repeat __________ times. Complete this stretch __________ times per day.  STRETCH - Knee Extension Sitting  Sit with your right / left leg/heel propped on another chair, coffee table, or foot stool.  Allow your leg muscles to relax, letting gravity straighten out your knee.*  You should feel a stretch behind your right / left  knee. Hold this position for __________ seconds. Repeat __________ times. Complete this stretch __________ times per day.  *Your physician, physical therapist or athletic trainer may instruct you place a __________ weight on your thigh, just above your kneecap, to deepen the stretch.  STRENGTHENING EXERCISES - Meniscus Tear, Non-operative, Phase I These exercises may help you when beginning to rehabilitate your injury. They may resolve your symptoms with or without further involvement from your physician, physical therapist or athletic trainer. While completing these exercises, remember:   Muscles can gain both the endurance and the strength needed for everyday activities through controlled exercises.  Complete these exercises as instructed by your physician, physical therapist or athletic trainer. Progress the resistance and repetitions only as guided. STRENGTH - Quadriceps, Isometrics  Lie on your back with your right / left leg extended and your opposite knee bent.  Gradually tense the muscles in the front of your right / left thigh. You should see either your knee cap slide up toward your hip or increased dimpling just above the knee. This motion will push the back of the knee down toward the floor, mat, or bed on which you are lying.  Hold the muscle as tight as you can, without increasing your pain, for __________ seconds.  Relax the muscles slowly and completely between each repetition. Repeat __________ times. Complete this exercise __________ times per day.  STRENGTH - Quadriceps, Short Arcs   Lie on your back. Place a __________ inch towel roll under your right / left knee, so that the knee bends slightly.  Raise only your lower leg by tightening the muscles in the front of your thigh. Do not allow your thigh to rise.  Hold this position for __________ seconds. Repeat __________ times. Complete this exercise __________ times per day.  OPTIONAL ANKLE WEIGHTS: Begin with  ____________________, but DO NOT exceed ____________________. Increase in 1 pound/0.5 kilogram increments. STRENGTH - Quadriceps, Straight Leg Raises  Quality counts! Watch for signs that the quadriceps muscle is working, to be sure you are strengthening the correct muscles and not "cheating" by substituting with healthier muscles.  Lay on your back with your right / left leg extended and your opposite knee bent.  Tense the muscles in the front of your right / left thigh. You should see either your knee cap slide up or increased dimpling just above the knee. Your thigh may even shake a bit.  Tighten these muscles even more and raise your leg 4 to 6 inches off the floor. Hold for __________ seconds.  Keeping these muscles tense, lower your leg.  Relax the muscles slowly and completely in between each repetition. Repeat __________ times. Complete this exercise __________ times per day.  STRENGTH - Hamstring, Curls   Lay on your stomach with your legs extended. (If you lay on a bed, your feet may hang over the edge.)  Tighten the muscles in the back of your  thigh to bend your right / left knee up to 90 degrees. Keep your hips flat on the bed.  Hold this position for __________ seconds.  Slowly lower your leg back to the starting position. Repeat __________ times. Complete this exercise __________ times per day.  STRENGTH - Quadriceps, Squats  Stand in a door frame so that your feet and knees are in line with the frame.  Use your hands for balance, not support, on the frame.  Slowly lower your weight, bending at the hips and knees. Keep your lower legs upright so that they are parallel with the door frame. Squat only within the range that does not increase your knee pain. Never let your hips drop below your knees.  Slowly return upright, pushing with your legs, not pulling with your hands. Repeat __________ times. Complete this exercise __________ times per day.  STRENGTH - Quad/VMO,  Isometric   Sit in a chair with your right / left knee slightly bent. With your fingertips, feel the VMO muscle just above the inside of your knee. The VMO is important in controlling the position of your kneecap.  Keeping your fingertips on this muscle. Without actually moving your leg, attempt to drive your knee down as if straightening your leg. You should feel your VMO tense. If you have a difficult time, you may wish to try the same exercise on your healthy knee first.  Tense this muscle as hard as you can without increasing any knee pain.  Hold for __________ seconds. Relax the muscles slowly and completely in between each repetition. Repeat __________ times. Complete exercise __________ times per day.    This information is not intended to replace advice given to you by your health care provider. Make sure you discuss any questions you have with your health care provider.   Document Released: 09/07/1998 Document Revised: 01/08/2015 Document Reviewed: 12/06/2008 Elsevier Interactive Patient Education [employer]
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
## 2015-11-18 ENCOUNTER — Telehealth: Payer: Self-pay | Admitting: Family Medicine

## 2015-11-18 NOTE — Telephone Encounter (Signed)
Offer him first 15 minute.

## 2015-11-18 NOTE — Telephone Encounter (Signed)
Patient needs a follow up.  Initially seen Bryce [name] on 3/3.  Tammy SoursGreg is booked all week with the exception of Friday.  Patient is requesting Dr. Katrinka BlazingSmith to work him in one day this week towards the end of day.

## 2015-11-18 NOTE — Telephone Encounter (Signed)
Called pt, offered him an opening tomorrow at815am w/ dr Katrinka Blazingsmith. Pt stated he could not do this time & would need something after 3pm. i advised pt I would call him back if something comes available.

## 2015-11-19 NOTE — Telephone Encounter (Signed)
Spoke with patient, offered appointment for 3/16 at 3:30pm with Dr. Katrinka BlazingSmith which works for him

## 2015-11-21 ENCOUNTER — Encounter: Payer: Self-pay | Admitting: Family Medicine

## 2015-11-21 ENCOUNTER — Other Ambulatory Visit (INDEPENDENT_AMBULATORY_CARE_PROVIDER_SITE_OTHER): Payer: Medicare Other

## 2015-11-21 ENCOUNTER — Ambulatory Visit (INDEPENDENT_AMBULATORY_CARE_PROVIDER_SITE_OTHER): Payer: Medicare Other | Admitting: Family Medicine

## 2015-11-21 VITALS — BP 114/78 | HR 77 | Ht 74.0 in | Wt 275.0 lb

## 2015-11-21 DIAGNOSIS — S83241A Other tear of medial meniscus, current injury, right knee, initial encounter: Secondary | ICD-10-CM

## 2015-11-21 DIAGNOSIS — M25561 Pain in right knee: Secondary | ICD-10-CM

## 2015-11-21 DIAGNOSIS — S83249A Other tear of medial meniscus, current injury, unspecified knee, initial encounter: Secondary | ICD-10-CM | POA: Insufficient documentation
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
## 2015-11-21 NOTE — Assessment & Plan Note (Signed)
Patient is still on a conservative therapies. I do not want to do any more anti-inflammatories with him having glaucoma. Patient did respond very well with complete resolution of pain after the injection. Patient work with Event organiserathletic trainer to learn home exercises in greater detail. We discussed icing regimen. Patient is coming continue the topical anti-inflammatories and was given by another provider. Patient will come back and see me again in 3 weeks. If worsening symptoms we may need to consider formal physical therapy or advance imaging.

## 2015-11-21 NOTE — Progress Notes (Signed)
Pre visit review using our clinic review tool, if applicable. No additional management support is needed unless otherwise documented below in the visit note. 

## 2015-11-21 NOTE — Patient Instructions (Signed)
Good to see you  Tell your better half hello Ice 20 minutes 2 times daily. Usually after activity and before bed. pennsaid pinkie amount topically 2 times daily as needed.  Injected knee today  Avoid any twisting or deep squats if you can  See me again in 3 weeks to make sure you are doing well.

## 2015-12-17 ENCOUNTER — Ambulatory Visit: Payer: Medicare Other | Admitting: Family Medicine

## 2015-12-19 ENCOUNTER — Ambulatory Visit (INDEPENDENT_AMBULATORY_CARE_PROVIDER_SITE_OTHER)
Admission: RE | Admit: 2015-12-19 | Discharge: 2015-12-19 | Disposition: A | Discharge status: Home / Self Care | Payer: Medicare Other | Source: Ambulatory Visit | Attending: Family Medicine | Admitting: Family Medicine

## 2015-12-19 ENCOUNTER — Ambulatory Visit (INDEPENDENT_AMBULATORY_CARE_PROVIDER_SITE_OTHER): Payer: Medicare Other | Admitting: Family Medicine

## 2015-12-19 ENCOUNTER — Encounter: Payer: Self-pay | Admitting: Family Medicine

## 2015-12-19 VITALS — BP 120/90 | HR 85 | Ht 74.0 in | Wt 274.0 lb

## 2015-12-19 DIAGNOSIS — S83241D Other tear of medial meniscus, current injury, right knee, subsequent encounter: Secondary | ICD-10-CM | POA: Diagnosis not present

## 2015-12-19 DIAGNOSIS — H401133 Primary open-angle glaucoma, bilateral, severe stage: Secondary | ICD-10-CM | POA: Diagnosis not present

## 2015-12-19 DIAGNOSIS — S8991XA Unspecified injury of right lower leg, initial encounter: Secondary | ICD-10-CM | POA: Diagnosis not present

## 2015-12-19 DIAGNOSIS — M25561 Pain in right knee: Secondary | ICD-10-CM | POA: Diagnosis not present

## 2015-12-19 MED ORDER — TRAMADOL HCL 50 MG PO TABS: 50.0000 mg | ORAL_TABLET | Freq: Two times a day (BID) | ORAL | Status: DC | PRN

## 2015-12-19 NOTE — Progress Notes (Signed)
Bryce [name] D.O. [name] [address] [place], [place] [address] Phone: [phone] Subjective:    I'm seeing this patient by the request  of:  [name], MD [name], NP  CC: Right knee pain f/u   BJY:NWGNFAOZHYHPI:Subjective Bryce Pillarlvin L Delpino Sr. is a 40 y.o. male coming in with complaint of right knee pain. Patient was seen previously and did have a fairly large posterior medial meniscal tear. Patient was given an injection. States he was pain free for approximately 3 days and the pain is slowly coming back. Patient states that certain movements causes a severe sharp pain. Denies any locking but states that can stop him from activity. Has not given out on him.       Past Medical History  Diagnosis Date  . Fibromyalgia   . Glaucoma    Past Surgical History  Procedure Laterality Date  . Eye surgery    . Hernia repair     Social History   Social History  . Marital Status: Married    Spouse Name: N/A  . Number of Children: N/A  . Years of Education: N/A   Social History Main Topics  . Smoking status: Never Smoker   . Smokeless tobacco: None  . Alcohol Use: No  . Drug Use: No  . Sexual Activity: Not Asked   Other Topics Concern  . None   Social History Narrative   Allergies  Allergen Reactions  . Citrus Anaphylaxis  . Latex Rash   No family history on file.Family history glaucoma   Past medical history, social, surgical and family history all reviewed in electronic medical record.  No pertanent information unless stated regarding to the chief complaint.   Review of Systems: No headache, visual changes, nausea, vomiting, diarrhea, constipation, dizziness, abdominal pain, skin rash, fevers, chills, night sweats, weight loss, swollen lymph nodes, body aches, joint swelling, muscle aches, chest pain, shortness of breath, mood changes.   Objective Blood pressure 120/90, pulse 85, height 6\' 2"  (1.88 m), weight 274 lb (124.286 kg), SpO2 95 %.  General: No  apparent distress alert and oriented x3 mood and affect normal, dressed appropriately. Wearing glasses HEENT: Pupils equal, extraocular movements intact  Respiratory: Patient's speak in full sentences and does not appear short of breath  Cardiovascular: No lower extremity edema, non tender, no erythema  Skin: Warm dry intact with no signs of infection or rash on extremities or on axial skeleton.  Abdomen: Soft nontender  Neuro: Cranial nerves II through XII are intact, neurovascularly intact in all extremities with 2+ DTRs and 2+ pulses.  Lymph: No lymphadenopathy of posterior or anterior cervical chain or axillae bilaterally.  Gait normal with good balance and coordination.  MSK:  Non tender with full range of motion and good stability and symmetric strength and tone of shoulders, elbows, wrist, hip, and ankles bilaterally.  Knee: Right Normal to inspection with no erythema or effusion or obvious bony abnormalities. Tender to palpation over the medial joint line and minorly worsen previous exam ROM full in flexion and extension and lower leg rotation. Ligaments with solid consistent endpoints including ACL, PCL, LCL, MCL. Positive Mcmurray's, Apley's, and Thessalonian tests. Non painful patellar compression. Patellar glide with mild crepitus. Patellar and quadriceps tendons unremarkable. Hamstring and quadriceps strength is normal.  Contralateral knee unremarkable      Impression and Recommendations:     This case required medical decision making of moderate complexity.      Note: This dictation was prepared  with Dragon dictation along with smaller phrase technology. Any transcriptional errors that result from this process are unintentional.

## 2015-12-19 NOTE — Patient Instructions (Signed)
Good to see you  Tramadol with a tylenol up to 2 times a day  Ice 20 minutes 2 times daily. Usually after activity and before bed. Xray downstairs Wear brace with activity  OK to bike or ellitpical No twisting or jumping (that means basketball) PT will be calling you  See me again in 4 weeks.

## 2015-12-19 NOTE — Assessment & Plan Note (Signed)
No improvement versus possible worsening. Patient has not had an injection. Will be sent to formal physical therapy. Does have a brace which will help with some stability. We discussed which activities to avoid which patient had not been doing regularly. Patient will try to do this more adamantly. We discussed proper shoe choices. Patient and will come back and see me again in 4 weeks. If not having any improvement then I would consider a MRI but then likely with thinking surgical intervention.  Spent  25 minutes with patient face-to-face and had greater than 50% of counseling including as described above in assessment and plan.

## 2015-12-19 NOTE — Progress Notes (Signed)
Pre visit review using our clinic review tool, if applicable. No additional management support is needed unless otherwise documented below in the visit note. 

## 2016-01-08 ENCOUNTER — Ambulatory Visit: Payer: Medicare Other | Attending: Family Medicine | Admitting: Physical Therapy

## 2016-01-08 DIAGNOSIS — R2689 Other abnormalities of gait and mobility: Secondary | ICD-10-CM | POA: Insufficient documentation

## 2016-01-08 DIAGNOSIS — M25561 Pain in right knee: Secondary | ICD-10-CM | POA: Diagnosis not present

## 2016-01-08 DIAGNOSIS — M6281 Muscle weakness (generalized): Secondary | ICD-10-CM | POA: Diagnosis not present

## 2016-01-08 DIAGNOSIS — M25661 Stiffness of right knee, not elsewhere classified: Secondary | ICD-10-CM | POA: Diagnosis not present

## 2016-01-08 DIAGNOSIS — R600 Localized edema: Secondary | ICD-10-CM | POA: Diagnosis not present

## 2016-01-08 NOTE — Therapy (Signed)
[facility] [address] [place], [place], [address] Phone: [phone]   Fax:  [phone]  Physical Therapy Evaluation  Patient Details  Name: Bryce [name] Sr. MRN: [id number] Date of Birth: 03-04-1976 Referring Provider: Antoine Primas MD  Encounter Date: 01/08/2016      PT End of Session - 01/08/16 1726    Visit Number 1   Number of Visits 17   Date for PT Re-Evaluation 03/04/16   Authorization Type Medicaid: Kx mod by 15 visit, Progress note by 10th visit.    PT Start Time 1633   PT Stop Time 1720   PT Time Calculation (min) 47 min   Activity Tolerance Patient tolerated treatment well   Behavior During Therapy WFL for tasks assessed/performed      Past Medical History  Diagnosis Date  . Fibromyalgia   . Glaucoma     Past Surgical History  Procedure Laterality Date  . Eye surgery    . Hernia repair      There were no vitals filed for this visit.       Subjective Assessment - 01/08/16 1641    Subjective pt is a 40 y.o M with CC of R knee pain from slipping on ice in January. he reports seeing his physicain and was DX with a medial mensical tear. He reported twisting his knee and felt pain. Since the fall he reported the pain ahs gotten worse, and reports the pain stays in the knee. pt reports joining line tenderness, popping and clicking and giving away of the knee".   Limitations Standing   How long can you sit comfortably? 20 min   How long can you stand comfortably? 20 min   How long can you walk comfortably? 20 min   Diagnostic tests X-ray, [place]    Patient Stated Goals to get the knee stable, strength the legs, decrease the pain, to be able to play basketball   Currently in Pain? Yes   Pain Score 2    Pain Location Knee   Pain Orientation Right   Pain Descriptors / Indicators Aching;Sharp  popping/ clicking   Pain Type Chronic pain   Pain Onset More than a month ago   Pain Frequency Intermittent    Aggravating Factors  deep knee bending, standing on one leg, putting on shoes and socks,    Pain Relieving Factors taking pain meds, ice, gel/cream,             [facility] PT Assessment - 01/08/16 1652    Assessment   Medical Diagnosis R medial mensical tear   Referring Provider Antoine Primas MD   Onset Date/Surgical Date --  January 2017   Hand Dominance Right   Next MD Visit --  1-2 weeks   Prior Therapy yes   Precautions   Precaution Comments no basketball, treadmill, no lifting, no bending more than 65 degrees   Balance Screen   Has the patient fallen in the past 6 months Yes   How many times? 1   Has the patient had a decrease in activity level because of a fear of falling?  No   Is the patient reluctant to leave their home because of a fear of falling?  No   Home Environment   Living Environment Private residence   Living Arrangements Spouse/significant other;Children   Available Help at Discharge Available PRN/intermittently;Available 24 hours/day   Type of Home Apartment   Home Access Level entry   Home Layout One level  Home Equipment Cane - single point  stick due to being legally blind   Prior Function   Level of Independence Independent;Independent with basic ADLs   Vocation On disability   Leisure basketball,    Cognition   Overall Cognitive Status Within Functional Limits for tasks assessed   Observation/Other Assessments   Focus on Therapeutic Outcomes (FOTO)  58% limited  predicted 40% limited   Posture/Postural Control   Posture/Postural Control Postural limitations   Postural Limitations Forward head;Rounded Shoulders   ROM / Strength   AROM / PROM / Strength PROM;AROM;Strength   AROM   AROM Assessment Site Knee   Right/Left Knee Right;Left   Right Knee Extension -6   Right Knee Flexion 98  limited due to pain   Left Knee Extension 0   Left Knee Flexion 130   PROM   PROM Assessment Site Knee   Right/Left Knee Right   Right Knee Extension 0    Right Knee Flexion 125   Strength   Strength Assessment Site Hip;Knee   Right/Left Hip Right;Left   Right Hip Flexion 4-/5   Right Hip Extension 3+/5   Right Hip ABduction 4-/5   Right Hip ADduction 4+/5   Left Hip Flexion 4+/5   Left Hip Extension 4-/5   Left Hip ABduction 4-/5   Left Hip ADduction 4+/5   Right/Left Knee Right;Left   Right Knee Flexion 3+/5  pain during testing   Right Knee Extension 4/5   Left Knee Flexion 5/5   Left Knee Extension 5/5   Palpation   Palpation comment tenderness along the medial joint line    Special Tests    Special Tests Meniscus Tests   Meniscus Tests McMurray Test;Apley's Compression;Apley's Distraction   McMurray Test   Findings Positive   Side Right   Apley's Compression   Findings Positive   Side  Right   Apley's Distraction   Findings Positive   Side Right   Ambulation/Gait   Ambulation/Gait Yes   Gait Pattern Step-through pattern;Antalgic;Decreased stride length                             PT Short Term Goals - 01/08/16 1736    PT SHORT TERM GOAL #1   Title pt will be I with initial HEP (02/08/2016)   Time 4   Period Weeks   Status New   PT SHORT TERM GOAL #2   Title pt will be able to verbalize techniques to control pain and inflammation via RICE (02/08/2016)   Time 4   Period Weeks   Status New   PT SHORT TERM GOAL #3   Title pt will improve R knee flexion to >/= 105 degrees with </= 5/10 pain to assist with functional progression (02/08/2016)   Time 4   Period Weeks   Status New   PT SHORT TERM GOAL #4   Title pt will improve R quad/ hamstring strength to >/= 4-/5 with </=5/10 pain to walking/ standing endurnace (02/08/2016)   Time 4   Period Weeks   Status New           PT Long Term Goals - 01/08/16 1741    PT LONG TERM GOAL #1   Title pt will be I with all HEP as of last visit (03/04/2016)   Time 8   Period Weeks   Status New   PT LONG TERM GOAL #2   Title pt will improve R knee  flexion to >/= 120 degrees and extension to </= -3 degrees with </= 2/10 pain to promote a functional and efficient gait pattern (03/04/2016)   Time 8   Period Weeks   Status New   PT LONG TERM GOAL #3   Title pt will improve R knee/ hip strength to >/= 4+/5 to assist with prolonged walking and standing activities (03/04/2016)   Time 8   Period Weeks   Status New   PT LONG TERM GOAL #4   Title pt will be able to walk/ stand for >/= 45 min with </= 2/10 pain for functional endurance required for ADLS and pt's personal goal of returning to exercising/walking (03/04/2016)   Time 8   Period Weeks   Status New   PT LONG TERM GOAL #5   Title pt will improve his FOTO score to >/=60 to demonstrate improvement in function at discharge (03/04/2016)   Time 8   Period Weeks   Status New               Plan - 01/08/16 1726    Clinical Impression Statement Mr. Adora FridgeKight present to OPPT as a Moderate complexity evaluation with CC or R knee pain due to a fall on ice back in January 2017. He demonstrates limited knee AROM with pain at end range flexion, with functional PROM. MMT of the R hamstrings were weak due to pain with bil hip weakness. He exhibits antalgic gait pattern with decreased stride and weight shift on the R compared bil. Special testing in combination with pt subjective hx is consistent with dx of mensical tear. He would benefit from physical therapy to decrease pain, improve strength, improve endurance and gait as well and return to PLOF by addressing the impairments listed.     Rehab Potential Good   PT Frequency 2x / week   PT Duration 8 weeks   PT Treatment/Interventions ADLs/Self Care Home Management;Cryotherapy;Electrical Stimulation;Iontophoresis 4mg /ml Dexamethasone;Moist Heat;Therapeutic exercise;Therapeutic activities;Taping;Vasopneumatic Device;Dry needling;Passive range of motion;Manual techniques;Ultrasound   PT Next Visit Plan assess/ review HEP, hip/ knee strengthening, bike,  modalities    PT Home Exercise Plan standing hip extension/ abduction, sit to stand, SLR, heel slides   Consulted and Agree with Plan of Care Patient;Family member/caregiver   Family Member Consulted wife      Patient will benefit from skilled therapeutic intervention in order to improve the following deficits and impairments:  Abnormal gait, Pain, Improper body mechanics, Postural dysfunction, Decreased strength, Increased edema, Decreased balance, Decreased endurance, Decreased range of motion, Hypomobility  Visit Diagnosis: Pain in right knee - Plan: PT plan of care cert/re-cert  Stiffness of right knee, not elsewhere classified - Plan: PT plan of care cert/re-cert  Other abnormalities of gait and mobility - Plan: PT plan of care cert/re-cert  Muscle weakness (generalized) - Plan: PT plan of care cert/re-cert  Localized edema - Plan: PT plan of care cert/re-cert      G-Codes - 01/08/16 1747    Functional Assessment Tool Used clinical judgement/ FOTO    Functional Limitation Mobility: Walking and moving around   Mobility: Walking and Moving Around Current Status [phone](G8978) At least 40 percent but less than 60 percent impaired, limited or restricted   Mobility: Walking and Moving Around Goal Status [phone]([id number]) At least 20 percent but less than 40 percent impaired, limited or restricted       Problem List Patient Active Problem List   Diagnosis Date Noted  . Acute medial meniscal tear 11/21/2015  . Right  knee pain 11/08/2015  . Pain of right heel 11/08/2015  . Right ear pain 04/23/2015  . Fibromyalgia 03/15/2015  . Glaucoma 03/15/2015  . Legally blind 03/15/2015   Lulu Riding PT, DPT, LAT, ATC  01/08/2016  5:51 PM      [facility] [address] [place], [place], [address] Phone: [phone]   Fax:  [phone]  Name: KRISHON ADKISON Sr. MRN: [id number] Date of Birth: Aug 31, 1976

## 2016-01-13 ENCOUNTER — Ambulatory Visit: Payer: Medicare Other | Admitting: Physical Therapy

## 2016-01-13 DIAGNOSIS — R600 Localized edema: Secondary | ICD-10-CM

## 2016-01-13 DIAGNOSIS — M25561 Pain in right knee: Secondary | ICD-10-CM | POA: Diagnosis not present

## 2016-01-13 DIAGNOSIS — R2689 Other abnormalities of gait and mobility: Secondary | ICD-10-CM | POA: Diagnosis not present

## 2016-01-13 DIAGNOSIS — M25661 Stiffness of right knee, not elsewhere classified: Secondary | ICD-10-CM

## 2016-01-13 DIAGNOSIS — M6281 Muscle weakness (generalized): Secondary | ICD-10-CM | POA: Diagnosis not present

## 2016-01-13 NOTE — Patient Instructions (Addendum)
Hamstring Step 1    Straighten left knee. Keep knee level with other knee or on bolster. Hold 30__ seconds. Relax knee by returning foot to start. Repeat _3__ times.  Copyright  VHI. All rights reserved.  Gastroc / Heel Cord Stretch - On Step    Stand with heels over edge of stair. Holding rail, lower heels until stretch is felt in calf of legs. Repeat 3___ times. Do __30Clam    Lie on side, legs bent 90. Open top knee to ceiling, rotating leg outward. Touch toes to ankle of bottom leg.  Maintain hip position. Repeat ___10-20_ times. Repeat on other side. Do 1____ sessions per day.   Do not have to do 2nd position.   Plantarflexion: Heel Lift - Lowering (Eccentric) - Single Leg    Place quarter under big toe joint of affected leg. Balance on affected leg and rise up on toes, holding support. Keep weight on quarter. Slowly lower heel for 3-5 seconds. __20_ reps per set, _1__ sets per day, _3-4__ days per week.   http://ecce.exer.us/20   Copyright  VHI. All rights reserved.  Ball to [place]'Lakes[place] Heel Lift    Push and hold ball to wall with outer hip. Lift heel of inner foot. Keep pelvis level. Do __10-20_ times. Repeat on other side. Do _1__ times per day.  http://ss.exer.us/150   Copyright  VHI. All rights reserved

## 2016-01-13 NOTE — Therapy (Signed)
[facility] [address] [place], [place], [address] Phone: [phone]   Fax:  [phone]  Physical Therapy Treatment  Patient Details  Name: Bryce Pillarlvin L Klimas Sr. MRN: [id number] Date of Birth: 08/31/1976 Referring Provider: Antoine PrimasZachary smith MD  Encounter Date: 01/13/2016    Past Medical History  Diagnosis Date  . Fibromyalgia   . Glaucoma     Past Surgical History  Procedure Laterality Date  . Eye surgery    . Hernia repair      There were no vitals filed for this visit.      Subjective Assessment - 01/13/16 1633    Subjective Mild pain    Currently in Pain? No/denies   Pain Score --  mild   Pain Location Knee   Pain Orientation Right;Medial   Pain Descriptors / Indicators Sharp   Pain Frequency Intermittent   Aggravating Factors  putting on shoes and socks. Steps   Pain Relieving Factors medication, gels/creams                         [facility] Adult PT Treatment/Exercise - 01/13/16 0001    Knee/Hip Exercises: Stretches   Passive Hamstring Stretch 3 reps;30 seconds   Gastroc Stretch 3 reps;30 seconds   Other Knee/Hip Stretches posterior knee 2 minutes for mild stretch pillow edge 3 inches below knee   Knee/Hip Exercises: Aerobic   Recumbent Bike recumbant bike   Knee/Hip Exercises: Standing   Heel Raises 20 reps;1 set  single   Hip Extension 10 reps   Extension Limitations standing weightbearing rt,  Prone X 10   Other Standing Knee Exercises Glut Med 10 X  cues, HEP   Knee/Hip Exercises: Seated   Sit to Sand 10 reps  slight weight shift   Knee/Hip Exercises: Supine   Heel Slides 1 set;10 reps   Terminal Knee Extension Limitations Standing pillow and ball squeeze with quad set 10 x 2 sets   Straight Leg Raises Limitations 10 X 2 sets   Knee/Hip Exercises: Sidelying   Hip ABduction Limitations 10   Hip ADduction 10 reps   Clams 10   blue band  HEP   Knee/Hip Exercises: Prone   Straight  Leg Raises 10 reps   Moist Heat Therapy   Number Minutes Moist Heat 10 Minutes   Moist Heat Location Knee  posterior   Cryotherapy   Number Minutes Cryotherapy 10 Minutes   Cryotherapy Location Knee  anterior, elevated   Type of Cryotherapy --  cold pack                  PT Short Term Goals - 01/13/16 1744    PT SHORT TERM GOAL #1   Title pt will be I with initial HEP (02/08/2016)   Time 4   Period Weeks   Status On-going   PT SHORT TERM GOAL #2   Title pt will be able to verbalize techniques to control pain and inflammation via RICE (02/08/2016)   Baseline discussed a little today.   Time 4   Period Weeks   Status On-going   PT SHORT TERM GOAL #3   Title pt will improve R knee flexion to >/= 105 degrees with </= 5/10 pain to assist with functional progression (02/08/2016)   Baseline ROM not yet changed   Time 4   Period Weeks   Status On-going   PT SHORT TERM GOAL #4   Title pt will improve R quad/  hamstring strength to >/= 4-/5 with </=5/10 pain to walking/ standing endurnace (02/08/2016)   Time 4   Period Weeks   Status Unable to assess           PT Long Term Goals - 01/08/16 1741    PT LONG TERM GOAL #1   Title pt will be I with all HEP as of last visit (03/04/2016)   Time 8   Period Weeks   Status New   PT LONG TERM GOAL #2   Title pt will improve R knee flexion to >/= 120 degrees and extension to </= -3 degrees with </= 2/10 pain to promote a functional and efficient gait pattern (03/04/2016)   Time 8   Period Weeks   Status New   PT LONG TERM GOAL #3   Title pt will improve R knee/ hip strength to >/= 4+/5 to assist with prolonged walking and standing activities (03/04/2016)   Time 8   Period Weeks   Status New   PT LONG TERM GOAL #4   Title pt will be able to walk/ stand for >/= 45 min with </= 2/10 pain for functional endurance required for ADLS and pt's personal goal of returning to exercising/walking (03/04/2016)   Time 8   Period Weeks   Status  New   PT LONG TERM GOAL #5   Title pt will improve his FOTO score to >/=60 to demonstrate improvement in function at discharge (03/04/2016)   Time 8   Period Weeks   Status New               Plan - 01/13/16 1740    Clinical Impression Statement Slight pain today with exercise and turning when walking.  Progress toward home exercises.  Heat and cold helpful with pain.  ROM not yet changed.  Pain very intermittant. Brief.   PT Next Visit Plan review new home exercises. Check ROM, give RICE information.   PT Home Exercise Plan glut med, terminal knee extension, single leg heel lift, hamstring and calf stretches.    Consulted and Agree with Plan of Care Patient;Family member/caregiver   Family Member Consulted wife      Patient will benefit from skilled therapeutic intervention in order to improve the following deficits and impairments:  Abnormal gait, Pain, Improper body mechanics, Postural dysfunction, Decreased strength, Increased edema, Decreased balance, Decreased endurance, Decreased range of motion, Hypomobility  Visit Diagnosis: Pain in right knee  Stiffness of right knee, not elsewhere classified  Other abnormalities of gait and mobility  Muscle weakness (generalized)  Localized edema     Problem List Patient Active Problem List   Diagnosis Date Noted  . Acute medial meniscal tear 11/21/2015  . Right knee pain 11/08/2015  . Pain of right heel 11/08/2015  . Right ear pain 04/23/2015  . Fibromyalgia 03/15/2015  . Glaucoma 03/15/2015  . Legally blind 03/15/2015    [facility] 01/13/2016, 5:45 PM  [facility] [address] [place], [place], [address] Phone: [phone]   Fax:  [phone]  Name: Bryce [name] Sr. MRN: [id number] Date of Birth: 1975-12-12    Liz Beach, PTA 01/13/2016 5:45 PM Phone: [phone] Fax: [phone]

## 2016-01-16 ENCOUNTER — Encounter: Payer: Self-pay | Admitting: Family Medicine

## 2016-01-16 ENCOUNTER — Ambulatory Visit: Payer: Medicare Other | Admitting: Family Medicine

## 2016-01-16 ENCOUNTER — Ambulatory Visit: Payer: Medicare Other | Admitting: Physical Therapy

## 2016-01-16 ENCOUNTER — Ambulatory Visit (INDEPENDENT_AMBULATORY_CARE_PROVIDER_SITE_OTHER): Payer: Medicare Other | Admitting: Family Medicine

## 2016-01-16 VITALS — BP 114/80 | HR 77 | Ht 74.0 in | Wt 272.0 lb

## 2016-01-16 DIAGNOSIS — M6281 Muscle weakness (generalized): Secondary | ICD-10-CM

## 2016-01-16 DIAGNOSIS — S83241D Other tear of medial meniscus, current injury, right knee, subsequent encounter: Secondary | ICD-10-CM

## 2016-01-16 DIAGNOSIS — R2689 Other abnormalities of gait and mobility: Secondary | ICD-10-CM

## 2016-01-16 DIAGNOSIS — M25561 Pain in right knee: Secondary | ICD-10-CM

## 2016-01-16 DIAGNOSIS — M25661 Stiffness of right knee, not elsewhere classified: Secondary | ICD-10-CM | POA: Diagnosis not present

## 2016-01-16 DIAGNOSIS — R600 Localized edema: Secondary | ICD-10-CM | POA: Diagnosis not present

## 2016-01-16 NOTE — Progress Notes (Signed)
Bryce ScaleZach Rondle Lohse D.O. [facility] [address] [place], [place] [address] Phone: [phone] Subjective:    I'm seeing this patient by the request  of:  [name], MD [name], NP  CC: Right knee pain f/u   BJY:NWGNFAOZHYHPI:Subjective Bryce [name]. is a 40 y.o. male coming in with complaint of right knee pain. Patient was seen previously and did have a fairly large posterior medial meniscal tear.  Patient is artery had an injection 2 months ago with mild improvement. Has tried with formal physical therapy and states that he is 30% better. Still has the less shearing pain when he does certain activities. States that it can be severe and 9 out of 10 pain. Sometimes feels like his leg was to buckle. Still cannot go up stairs leading with this leg.       Past Medical History  Diagnosis Date  . Fibromyalgia   . Glaucoma    Past Surgical History  Procedure Laterality Date  . Eye surgery    . Hernia repair     Social History   Social History  . Marital Status: Married    Spouse Name: N/A  . Number of Children: N/A  . Years of Education: N/A   Social History Main Topics  . Smoking status: Never Smoker   . Smokeless tobacco: None  . Alcohol Use: No  . Drug Use: No  . Sexual Activity: Not Asked   Other Topics Concern  . None   Social History Narrative   Allergies  Allergen Reactions  . Citrus Anaphylaxis  . Latex Rash   No family history on file.Family history glaucoma   Past medical history, social, surgical and family history all reviewed in electronic medical record.  No pertanent information unless stated regarding to the chief complaint.   Review of Systems: No headache, visual changes, nausea, vomiting, diarrhea, constipation, dizziness, abdominal pain, skin rash, fevers, chills, night sweats, weight loss, swollen lymph nodes, body aches, joint swelling, muscle aches, chest pain, shortness of breath, mood changes.   Objective Blood pressure 114/80, pulse  77, height 6\' 2"  (1.88 m), weight 272 lb (123.378 kg), SpO2 98 %.  General: No apparent distress alert and oriented x3 mood and affect normal, dressed appropriately. Wearing glasses HEENT: Pupils equal, extraocular movements intact  Respiratory: Patient's speak in full sentences and does not appear short of breath  Cardiovascular: No lower extremity edema, non tender, no erythema  Skin: Warm dry intact with no signs of infection or rash on extremities or on axial skeleton.  Abdomen: Soft nontender  Neuro: Cranial nerves II through XII are intact, neurovascularly intact in all extremities with 2+ DTRs and 2+ pulses.  Lymph: No lymphadenopathy of posterior or anterior cervical chain or axillae bilaterally.  Gait normal with good balance and coordination.  MSK:  Non tender with full range of motion and good stability and symmetric strength and tone of shoulders, elbows, wrist, hip, and ankles bilaterally.  Knee: Right Trace effusion which is new Continued tenderness over the medial joint line ROM full in flexion and extension and lower leg rotation. Ligaments with solid consistent endpoints including ACL, PCL, LCL, MCL. Positive Mcmurray's, Apley's, and Thessalonian tests. Worse than previous exam now with double clicking as well Non painful patellar compression. Patellar glide with mild crepitus. Patellar and quadriceps tendons unremarkable. Hamstring and quadriceps strength is normal.  Contralateral knee unremarkable      Impression and Recommendations:     This case required medical  decision making of moderate complexity.      Note: This dictation was prepared with Dragon dictation along with smaller phrase technology. Any transcriptional errors that result from this process are unintentional.

## 2016-01-16 NOTE — Therapy (Signed)
[facility] [address] [place], [place], [address] Phone: [phone]   Fax:  [phone]  Physical Therapy Treatment  Patient Details  Name: Bryce Pillarlvin L Derocher Sr. MRN: [id number] Date of Birth: [birth year] Referring Provider: Antoine PrimasZachary smith MD  Encounter Date: 01/16/2016      PT End of Session - 01/16/16 1645    Visit Number 3   Number of Visits 17   Date for PT Re-Evaluation 03/04/16   Authorization Type Medicaid: Kx mod by 15 visit, Progress note by 10th visit.    PT Start Time 1630   PT Stop Time 1721   PT Time Calculation (min) 51 min   Activity Tolerance Patient tolerated treatment well   Behavior During Therapy WFL for tasks assessed/performed      Past Medical History  Diagnosis Date  . Fibromyalgia   . Glaucoma     Past Surgical History  Procedure Laterality Date  . Eye surgery    . Hernia repair      There were no vitals filed for this visit.      Subjective Assessment - 01/16/16 1638    Subjective doing the exercises being consistent   Currently in Pain? Yes   Pain Score 3    Pain Location Knee   Pain Orientation Right;Medial                         [facility] Adult PT Treatment/Exercise - 01/16/16 1641    Knee/Hip Exercises: Stretches   Passive Hamstring Stretch 3 reps;30 seconds   Quad Stretch 2 reps;30 seconds   Knee/Hip Exercises: Aerobic   Recumbent Bike 6 min - gradually increasing resistance every 2 min  ending at L4   Knee/Hip Exercises: Seated   Long Arc Quad AROM;Strengthening;Right;2 sets;10 reps;Weights   Long Arc Quad Weight 3 lbs.   Knee/Hip Exercises: Supine   Straight Leg Raises AROM;Strengthening;Right;2 sets;10 reps   Moist Heat Therapy   Number Minutes Moist Heat 10 Minutes   Moist Heat Location Knee  R knee, posterior aspect   Cryotherapy   Number Minutes Cryotherapy 1 Minutes   Cryotherapy Location Knee   Type of Cryotherapy Ice pack  L anterior aspect of  the knee   Manual Therapy   Manual Therapy Joint mobilization;Soft tissue mobilization   Joint Mobilization seated tibiofemoral distraction with PROM knee flexion/ extension and internal/ external rotation with distraction   Soft tissue mobilization IASTM over medial aspect of the R knee                  PT Short Term Goals - 01/13/16 1744    PT SHORT TERM GOAL #1   Title pt will be I with initial HEP (02/08/2016)   Time 4   Period Weeks   Status On-going   PT SHORT TERM GOAL #2   Title pt will be able to verbalize techniques to control pain and inflammation via RICE (02/08/2016)   Baseline discussed a little today.   Time 4   Period Weeks   Status On-going   PT SHORT TERM GOAL #3   Title pt will improve R knee flexion to >/= 105 degrees with </= 5/10 pain to assist with functional progression (02/08/2016)   Baseline ROM not yet changed   Time 4   Period Weeks   Status On-going   PT SHORT TERM GOAL #4   Title pt will improve R quad/ hamstring strength to >/= 4-/5 with </=5/10  pain to walking/ standing endurnace (02/08/2016)   Time 4   Period Weeks   Status Unable to assess           PT Long Term Goals - 01/08/16 1741    PT LONG TERM GOAL #1   Title pt will be I with all HEP as of last visit (03/04/2016)   Time 8   Period Weeks   Status New   PT LONG TERM GOAL #2   Title pt will improve R knee flexion to >/= 120 degrees and extension to </= -3 degrees with </= 2/10 pain to promote a functional and efficient gait pattern (03/04/2016)   Time 8   Period Weeks   Status New   PT LONG TERM GOAL #3   Title pt will improve R knee/ hip strength to >/= 4+/5 to assist with prolonged walking and standing activities (03/04/2016)   Time 8   Period Weeks   Status New   PT LONG TERM GOAL #4   Title pt will be able to walk/ stand for >/= 45 min with </= 2/10 pain for functional endurance required for ADLS and pt's personal goal of returning to exercising/walking (03/04/2016)   Time  8   Period Weeks   Status New   PT LONG TERM GOAL #5   Title pt will improve his FOTO score to >/=60 to demonstrate improvement in function at discharge (03/04/2016)   Time 8   Period Weeks   Status New               Plan - 01/16/16 1718    Clinical Impression Statement Mr Highley reports some pain today, but states he has been consistent with his HEP. focused on manual to calm down pain and to joint mobs with distraction to improve mobility. He was able to complete the given HEP without report of increased soreness.    PT Next Visit Plan  Check ROM, hip/ knee strengthening, gait training to avoid pivoting, modalities PRN,    Consulted and Agree with Plan of Care Patient   Family Member Consulted wife      Patient will benefit from skilled therapeutic intervention in order to improve the following deficits and impairments:  Abnormal gait, Pain, Improper body mechanics, Postural dysfunction, Decreased strength, Increased edema, Decreased balance, Decreased endurance, Decreased range of motion, Hypomobility  Visit Diagnosis: Stiffness of right knee, not elsewhere classified  Pain in right knee  Other abnormalities of gait and mobility  Muscle weakness (generalized)  Localized edema     Problem List Patient Active Problem List   Diagnosis Date Noted  . Acute medial meniscal tear 11/21/2015  . Right knee pain 11/08/2015  . Pain of right heel 11/08/2015  . Right ear pain 04/23/2015  . Fibromyalgia 03/15/2015  . Glaucoma 03/15/2015  . Legally blind 03/15/2015   Lulu Riding PT, DPT, LAT, ATC  01/16/2016  5:25 PM      [facility] [address] [place], [place], [address] Phone: [phone]   Fax:  [phone]  Name: Bryce BONEBRAKE Sr. MRN: [id number] Date of Birth: Oct 12, 1975

## 2016-01-16 NOTE — Assessment & Plan Note (Signed)
Patient continues to have difficulty with the knee. I do believe that patient's objectively possibly is worse than previous exam. We discussed with patient about different. An options and I do feel that advance imaging is warranted with him feeling all conservative therapy including 6 weeks of formal physical therapy. Encourage him to continue the medications. We discussed icing regimen. Continue to work with fiscal therapy until the MRI. If MRI shows a large displaced tear then I do think surgical management would be necessary.

## 2016-01-16 NOTE — Progress Notes (Signed)
Pre visit review using our clinic review tool, if applicable. No additional management support is needed unless otherwise documented below in the visit note. 

## 2016-01-16 NOTE — Patient Instructions (Signed)
Good to see you  Ice 20 minutes 2 times daily. Usually after activity and before bed. Keep doing the exercises We will get MRI  I will write you the results and tell you what is next

## 2016-01-20 ENCOUNTER — Encounter: Payer: Self-pay | Admitting: *Deleted

## 2016-01-21 ENCOUNTER — Ambulatory Visit: Payer: Medicare Other | Admitting: Physical Therapy

## 2016-01-21 DIAGNOSIS — M25661 Stiffness of right knee, not elsewhere classified: Secondary | ICD-10-CM | POA: Diagnosis not present

## 2016-01-21 DIAGNOSIS — R2689 Other abnormalities of gait and mobility: Secondary | ICD-10-CM | POA: Diagnosis not present

## 2016-01-21 DIAGNOSIS — M6281 Muscle weakness (generalized): Secondary | ICD-10-CM | POA: Diagnosis not present

## 2016-01-21 DIAGNOSIS — R600 Localized edema: Secondary | ICD-10-CM

## 2016-01-21 DIAGNOSIS — M25561 Pain in right knee: Secondary | ICD-10-CM | POA: Diagnosis not present

## 2016-01-21 NOTE — Therapy (Signed)
[facility] [address] [place], [place], [address] Phone: [phone]   Fax:  [phone]  Physical Therapy Treatment  Patient Details  Name: Bryce DILLAHUNT Sr. MRN: [id number] Date of Birth: 1976/01/02 Referring Provider: Antoine Primas MD  Encounter Date: 01/21/2016    Past Medical History  Diagnosis Date  . Fibromyalgia   . Glaucoma     Past Surgical History  Procedure Laterality Date  . Eye surgery    . Hernia repair      There were no vitals filed for this visit.      Subjective Assessment - 01/21/16 1629    Subjective Achey and sore from standing/walking around a football field during field day.  MRI on Sunday.     Currently in Pain? Yes   Pain Score 4    Pain Location Knee   Pain Orientation Right;Medial   Pain Descriptors / Indicators Sharp   Pain Frequency Intermittent   Aggravating Factors  standing longer periods of time.     Pain Relieving Factors rest,,ice, tub of epson salts,cream            [facility] PT Assessment - 01/21/16 0001    AROM   Right Knee Extension -6   Right Knee Flexion 124  tension vs pain                     [facility] Adult PT Treatment/Exercise - 01/21/16 1638    Knee/Hip Exercises: Aerobic   Recumbent Bike 6 minutes  increasing resistance each minute after 2 minutes,     Knee/Hip Exercises: Standing   Wall Squat 10 reps  small motion, cues   Knee/Hip Exercises: Seated   Long Arc Quad AROM;10 reps;2 sets   Knee/Hip Exercises: Supine   Quad Sets 10 reps   Heel Slides 10 reps   Bridges Limitations 2 sets 10 X,  feet away  unable to do single   Straight Leg Raises Limitations 10 X 2 sets.     Knee/Hip Exercises: Sidelying   Hip ADduction 10 reps;2 sets                PT Education - 01/21/16 1711    Education provided Yes   Education Details calf stretch and heel lifts.   Person(s) Educated Patient;Spouse   Methods Explanation;Demonstration;Verbal  cues;Handout   Comprehension Verbalized understanding;Returned demonstration          PT Short Term Goals - 01/21/16 1714    PT SHORT TERM GOAL #1   Title pt will be I with initial HEP (02/08/2016)   Baseline independent   Time 4   Period Weeks   Status Achieved   PT SHORT TERM GOAL #2   Title pt will be able to verbalize techniques to control pain and inflammation via RICE (02/08/2016)   Baseline ises ice and elevation at home as needed,  brace for some compression   Time 4   Period Weeks   Status Achieved   PT SHORT TERM GOAL #3   Title pt will improve R knee flexion to >/= 105 degrees with </= 5/10 pain to assist with functional progression (02/08/2016)   Baseline 125 degrees AROM,  mildly uncomfortable   Time 4   Period Weeks   Status On-going   PT SHORT TERM GOAL #4   Title pt will improve R quad/ hamstring strength to >/= 4-/5 with </=5/10 pain to walking/ standing endurnace (02/08/2016)   Time 4   Period Weeks  Status Unable to assess           PT Long Term Goals - 01/21/16 1715    PT LONG TERM GOAL #4   Title pt will be able to walk/ stand for >/= 45 min with </= 2/10 pain for functional endurance required for ADLS and pt's personal goal of returning to exercising/walking (03/04/2016)   Baseline 2/10 pain with standing and walking 45 minutes.  Able to satand and walk at field day 3 hours with 4/10 pain.    Time 8   Period Weeks   Status On-going               Plan - 01/21/16 1717    Clinical Impression Statement Flexion improving,  Standing improving.  Pain at end of session less than 4/10, no number given. STG #1,#2, #3 METLTG#4 met.     PT Next Visit Plan Check knee strength, continue to work on ROM and strengthening,  review new exercises.  try terminal knee extension.   PT Home Exercise Plan calf stretch on step,  Single leg heel lifts.   Consulted and Agree with Plan of Care Patient   Family Member Consulted wife      Patient will benefit from  skilled therapeutic intervention in order to improve the following deficits and impairments:  Abnormal gait, Pain, Improper body mechanics, Postural dysfunction, Decreased strength, Increased edema, Decreased balance, Decreased endurance, Decreased range of motion, Hypomobility  Visit Diagnosis: Stiffness of right knee, not elsewhere classified  Pain in right knee  Other abnormalities of gait and mobility  Muscle weakness (generalized)  Localized edema     Problem List Patient Active Problem List   Diagnosis Date Noted  . Acute medial meniscal tear 11/21/2015  . Right knee pain 11/08/2015  . Pain of right heel 11/08/2015  . Right ear pain 04/23/2015  . Fibromyalgia 03/15/2015  . Glaucoma 03/15/2015  . Legally blind 03/15/2015    Gaynel Schaafsma 01/21/2016, 5:22 PM  [facility] [address] [place], [place], [address] Phone: [phone]   Fax:  [phone]  Name: Bryce [name] Sr. MRN: [id number] Date of Birth: 02-21-1976    Liz Beach, PTA 01/21/2016 5:22 PM Phone: [phone] Fax: [phone] Liz Beach, PTA 01/21/2016 5:22 PM Phone: [phone] Fax: [phone]

## 2016-01-21 NOTE — Patient Instructions (Addendum)
Gastroc / Heel Cord Stretch - On Step    Stand with heels over edge of stair. Holding rail, lower heels until stretch is felt in calf of legs. Repeat 3_ times. Do __1Heel Raise (Calf Strength / Balance)    Stand with support. Breathe in. Rise up on tiptoes, breathing out through pursed lips. Return slowly, breathing in. Repeat __20 to 25  times per session. Do_1__ sessions per day.  3 to 4 X a week.  Variation: Do single leg.    Copyright  VHI. All rights reserved.  _ times per day.  Hold 30 seconddds  Copyright  VHI. All rights reserved.

## 2016-01-23 ENCOUNTER — Ambulatory Visit: Payer: Medicare Other | Admitting: Physical Therapy

## 2016-01-23 DIAGNOSIS — M25561 Pain in right knee: Secondary | ICD-10-CM

## 2016-01-23 DIAGNOSIS — M25661 Stiffness of right knee, not elsewhere classified: Secondary | ICD-10-CM

## 2016-01-23 DIAGNOSIS — M6281 Muscle weakness (generalized): Secondary | ICD-10-CM | POA: Diagnosis not present

## 2016-01-23 DIAGNOSIS — R2689 Other abnormalities of gait and mobility: Secondary | ICD-10-CM | POA: Diagnosis not present

## 2016-01-23 DIAGNOSIS — R600 Localized edema: Secondary | ICD-10-CM | POA: Diagnosis not present

## 2016-01-23 NOTE — Therapy (Signed)
[facility] [address] [place], [place], [address] Phone: [phone]   Fax:  [phone]  Physical Therapy Treatment  Patient Details  Name: Bryce Pillarlvin L Cardozo Sr. MRN: [id number] Date of Birth: 12-29-1975 Referring Provider: Antoine PrimasZachary smith MD  Encounter Date: 01/23/2016      PT End of Session - 01/23/16 1735    Visit Number 4   Number of Visits 17   Date for PT Re-Evaluation 03/04/16   PT Start Time 1634   PT Stop Time 1715   PT Time Calculation (min) 41 min   Activity Tolerance Patient tolerated treatment well   Behavior During Therapy [facility] for tasks assessed/performed      Past Medical History  Diagnosis Date  . Fibromyalgia   . Glaucoma     Past Surgical History  Procedure Laterality Date  . Eye surgery    . Hernia repair      There were no vitals filed for this visit.      Subjective Assessment - 01/23/16 1639    Subjective sore,Mildness.    Currently in Pain? Yes   Pain Score --  mild   Pain Location Knee   Pain Orientation Right;Medial   Pain Descriptors / Indicators Sore   Pain Frequency Intermittent                         [facility] Adult PT Treatment/Exercise - 01/23/16 0001    Knee/Hip Exercises: Stretches   Passive Hamstring Stretch 1 rep  1 minute, heel stretching on rolled towel, 2 reps    Other Knee/Hip Stretches knee stretch into extension supine  2X with towel roll  sitting X 1.  HEP   Knee/Hip Exercises: Aerobic   Recumbent Bike 5 minutes L3   Knee/Hip Exercises: Standing   Heel Raises 1 set;20 reps   Heel Raises Limitations single leg,  challanging, small  motions.    Wall Squat 10 reps;1 set  with big red ball   Other Standing Knee Exercises hip to ball at wall with press for gluteal strength (MED) 10 X each hip, moderate cues initially.    Knee/Hip Exercises: Sidelying   Clams 10 x 2 sets , blue band   Other Sidelying Knee/Hip Exercises side planl 10 x 2 each from  knees, cues   Knee/Hip Exercises: Prone   Straight Leg Raises AROM;Strengthening;Both;10 reps  2 sets, 1 set with knee flexed.                PT Education - 01/23/16 1728    Education provided Yes   Education Details knee stretch into extension.   Person(s) Educated Patient;Spouse   Methods Explanation;Demonstration;Handout   Comprehension Verbalized understanding;Returned demonstration          PT Short Term Goals - 01/21/16 1714    PT SHORT TERM GOAL #1   Title pt will be I with initial HEP (02/08/2016)   Baseline independent   Time 4   Period Weeks   Status Achieved   PT SHORT TERM GOAL #2   Title pt will be able to verbalize techniques to control pain and inflammation via RICE (02/08/2016)   Baseline ises ice and elevation at home as needed,  brace for some compression   Time 4   Period Weeks   Status Achieved   PT SHORT TERM GOAL #3   Title pt will improve R knee flexion to >/= 105 degrees with </= 5/10 pain to assist with  functional progression (02/08/2016)   Baseline 125 degrees AROM,  mildly uncomfortable   Time 4   Period Weeks   Status On-going   PT SHORT TERM GOAL #4   Title pt will improve R quad/ hamstring strength to >/= 4-/5 with </=5/10 pain to walking/ standing endurnace (02/08/2016)   Time 4   Period Weeks   Status Unable to assess           PT Long Term Goals - 01/21/16 1715    PT LONG TERM GOAL #4   Title pt will be able to walk/ stand for >/= 45 min with </= 2/10 pain for functional endurance required for ADLS and pt's personal goal of returning to exercising/walking (03/04/2016)   Baseline 2/10 pain with standing and walking 45 minutes.  Able to satand and walk at field day 3 hours with 4/10 pain.    Time 8   Period Weeks   Status On-going               Plan - 01/23/16 1735    Clinical Impression Statement Extension: 2 finger's width off table,  post stretch.  Progress toward home exercise goals.  Mils soreness post exercises,  declined need of cold pack.     PT Next Visit Plan Check knee strength, continue to work on ROM and strengthening,  review new exercise.  MRI this Sunday:  Results?   PT Home Exercise Plan knee stretch into extension, sitting   Consulted and Agree with Plan of Care Patient;Family member/caregiver   Family Member Consulted wife      Patient will benefit from skilled therapeutic intervention in order to improve the following deficits and impairments:  Abnormal gait, Pain, Improper body mechanics, Postural dysfunction, Decreased strength, Increased edema, Decreased balance, Decreased endurance, Decreased range of motion, Hypomobility  Visit Diagnosis: Stiffness of right knee, not elsewhere classified  Pain in right knee  Other abnormalities of gait and mobility  Muscle weakness (generalized)  Localized edema     Problem List Patient Active Problem List   Diagnosis Date Noted  . Acute medial meniscal tear 11/21/2015  . Right knee pain 11/08/2015  . Pain of right heel 11/08/2015  . Right ear pain 04/23/2015  . Fibromyalgia 03/15/2015  . Glaucoma 03/15/2015  . Legally blind 03/15/2015    [facility] 01/23/2016, 5:40 PM  [facility] [address] [place], [place], [address] Phone: [phone]   Fax:  [phone]  Name: Bryce KOHLENBERG Sr. MRN: [id number] Date of Birth: [birth year]    Liz Beach, PTA 01/23/2016 5:40 PM Phone: [phone] Fax: [phone]

## 2016-01-23 NOTE — Patient Instructions (Signed)
Knee: Extension    Be sure bottom is flat and back supported. Place towel roll under right ankle. Push gently down on thigh to straighten knee. Do not allow leg to twist. Hold _30___ seconds. Repeat __3__ times. Do _1-2___ sessions per day. CAUTION: Do not force joint. Stretch should be gentle and slow. Activity: Prop leg on low table and let knee hang and stretch.  Copyright  VHI. All rights reserved.

## 2016-01-26 ENCOUNTER — Ambulatory Visit
Admission: RE | Admit: 2016-01-26 | Discharge: 2016-01-26 | Disposition: A | Discharge status: Home / Self Care | Payer: Medicare Other | Source: Ambulatory Visit | Attending: Family Medicine | Admitting: Family Medicine

## 2016-01-26 DIAGNOSIS — M25461 Effusion, right knee: Secondary | ICD-10-CM | POA: Diagnosis not present

## 2016-01-26 DIAGNOSIS — M25561 Pain in right knee: Secondary | ICD-10-CM

## 2016-01-27 ENCOUNTER — Ambulatory Visit: Payer: Medicare Other | Admitting: Physical Therapy

## 2016-01-27 DIAGNOSIS — M6281 Muscle weakness (generalized): Secondary | ICD-10-CM | POA: Diagnosis not present

## 2016-01-27 DIAGNOSIS — R2689 Other abnormalities of gait and mobility: Secondary | ICD-10-CM | POA: Diagnosis not present

## 2016-01-27 DIAGNOSIS — M25661 Stiffness of right knee, not elsewhere classified: Secondary | ICD-10-CM | POA: Diagnosis not present

## 2016-01-27 DIAGNOSIS — M25561 Pain in right knee: Secondary | ICD-10-CM | POA: Diagnosis not present

## 2016-01-27 DIAGNOSIS — R600 Localized edema: Secondary | ICD-10-CM | POA: Diagnosis not present

## 2016-01-27 NOTE — Therapy (Signed)
[facility] [address] [place], [place], [address] Phone: [phone]   Fax:  [phone]  Physical Therapy Treatment  Patient Details  Name: Bryce SHEWELL Sr. MRN: [id number] Date of Birth: May 13, 1976 Referring Provider: Antoine Primas MD  Encounter Date: 01/27/2016      PT End of Session - 01/27/16 1723    Visit Number 5   Number of Visits 17   Date for PT Re-Evaluation 03/04/16   Authorization Type Medicaid: Kx mod by 15 visit, Progress note by 10th visit.    PT Start Time 1633   PT Stop Time 1725   PT Time Calculation (min) 52 min   Activity Tolerance Patient tolerated treatment well   Behavior During Therapy WFL for tasks assessed/performed      Past Medical History  Diagnosis Date  . Fibromyalgia   . Glaucoma     Past Surgical History  Procedure Laterality Date  . Eye surgery    . Hernia repair      There were no vitals filed for this visit.      Subjective Assessment - 01/27/16 1635    Subjective (p) " I am feeling more swollen and sore today"   Currently in Pain? (p) Yes   Pain Score (p) 8    Pain Location (p) Knee   Pain Orientation (p) Right;Medial   Pain Descriptors / Indicators (p) Aching;Sore   Pain Type (p) Chronic pain   Pain Onset (p) More than a month ago   Pain Frequency (p) Intermittent   Aggravating Factors  (p) prolonged standing,                          [facility] Adult PT Treatment/Exercise - 01/27/16 1709    Knee/Hip Exercises: Stretches   Other Knee/Hip Stretches knee stretch into extension supine  2X with towel roll   Knee/Hip Exercises: Aerobic   Recumbent Bike 5 minutes L3   Knee/Hip Exercises: Seated   Long Arc Quad AROM;Strengthening;Right;3 sets;10 reps  2 sets with focus on 6 sec eccentric lowering   Knee/Hip Exercises: Supine   Quad Sets 10 reps   Knee/Hip Exercises: Sidelying   Hip ABduction AROM;Strengthening;Right;2 sets;10 reps   Knee/Hip  Exercises: Prone   Straight Leg Raises AROM;Strengthening;Both;10 reps   Moist Heat Therapy   Number Minutes Moist Heat 10 Minutes   Moist Heat Location Knee   Manual Therapy   Manual therapy comments manual trigger point release x 5 along distal quads   Joint Mobilization seated tibiofemoral distraction with PROM knee flexion/ extension and internal/ external rotation with distraction   Soft tissue mobilization IASTM over medial aspect of the R knee                PT Education - 01/27/16 1720    Education provided Yes   Education Details sidelying hip abduction added to HEP, reviewed manual trigger point release to relieve tension of the quads   Person(s) Educated Patient;Spouse   Methods Explanation;Verbal cues;Demonstration;Handout   Comprehension Verbalized understanding          PT Short Term Goals - 01/21/16 1714    PT SHORT TERM GOAL #1   Title pt will be I with initial HEP (02/08/2016)   Baseline independent   Time 4   Period Weeks   Status Achieved   PT SHORT TERM GOAL #2   Title pt will be able to verbalize techniques to control pain and inflammation  via RICE (02/08/2016)   Baseline ises ice and elevation at home as needed,  brace for some compression   Time 4   Period Weeks   Status Achieved   PT SHORT TERM GOAL #3   Title pt will improve R knee flexion to >/= 105 degrees with </= 5/10 pain to assist with functional progression (02/08/2016)   Baseline 125 degrees AROM,  mildly uncomfortable   Time 4   Period Weeks   Status On-going   PT SHORT TERM GOAL #4   Title pt will improve R quad/ hamstring strength to >/= 4-/5 with </=5/10 pain to walking/ standing endurnace (02/08/2016)   Time 4   Period Weeks   Status Unable to assess           PT Long Term Goals - 01/21/16 1715    PT LONG TERM GOAL #4   Title pt will be able to walk/ stand for >/= 45 min with </= 2/10 pain for functional endurance required for ADLS and pt's personal goal of returning to  exercising/walking (03/04/2016)   Baseline 2/10 pain with standing and walking 45 minutes.  Able to satand and walk at field day 3 hours with 4/10 pain.    Time 8   Period Weeks   Status On-going               Plan - 01/27/16 1734    Clinical Impression Statement Mr. Adora FridgeKight reports increased soreness today reported at 8/10. Focused on pain reduction via IASTM and manual trigger point release which pt reported relief of tension. pt was able to perform all exercises following manual and reported it was easier and decreased pain. utilized MHP post session with elevation which following he reported pain dropped to 2/10.    PT Next Visit Plan Check knee strength, continue to work on ROM and strengthening,  review new exercise, assess response manual trigger point release   PT Home Exercise Plan sidelying hip abduction   Consulted and Agree with Plan of Care Patient      Patient will benefit from skilled therapeutic intervention in order to improve the following deficits and impairments:  Abnormal gait, Pain, Improper body mechanics, Postural dysfunction, Decreased strength, Increased edema, Decreased balance, Decreased endurance, Decreased range of motion, Hypomobility  Visit Diagnosis: Stiffness of right knee, not elsewhere classified  Pain in right knee  Other abnormalities of gait and mobility  Muscle weakness (generalized)  Localized edema     Problem List Patient Active Problem List   Diagnosis Date Noted  . Acute medial meniscal tear 11/21/2015  . Right knee pain 11/08/2015  . Pain of right heel 11/08/2015  . Right ear pain 04/23/2015  . Fibromyalgia 03/15/2015  . Glaucoma 03/15/2015  . Legally blind 03/15/2015   Lulu RidingKristoffer Johathon Overturf PT, DPT, LAT, ATC  01/27/2016  5:42 PM      [facility] [address] [place], [place], [address] Phone: [phone]   Fax:  [phone]  Name: Bryce Pillarlvin L Pergola Sr. MRN:  [id number] Date of Birth: June 16, 1976

## 2016-01-29 ENCOUNTER — Ambulatory Visit: Payer: Medicare Other | Admitting: Physical Therapy

## 2016-01-29 DIAGNOSIS — M25661 Stiffness of right knee, not elsewhere classified: Secondary | ICD-10-CM

## 2016-01-29 DIAGNOSIS — M6281 Muscle weakness (generalized): Secondary | ICD-10-CM

## 2016-01-29 DIAGNOSIS — R2689 Other abnormalities of gait and mobility: Secondary | ICD-10-CM

## 2016-01-29 DIAGNOSIS — M25561 Pain in right knee: Secondary | ICD-10-CM | POA: Diagnosis not present

## 2016-01-29 DIAGNOSIS — R600 Localized edema: Secondary | ICD-10-CM | POA: Diagnosis not present

## 2016-01-29 NOTE — Therapy (Signed)
[facility] [address] [place], [place], [address] Phone: [phone]   Fax:  [phone]  Physical Therapy Treatment  Patient Details  Name: Bryce Pillarlvin L Cayton Sr. MRN: [id number] Date of Birth: 08-25-1976 Referring Provider: Antoine PrimasZachary smith MD  Encounter Date: 01/29/2016      PT End of Session - 01/29/16 1724    Visit Number 6   Number of Visits 17   Date for PT Re-Evaluation 03/04/16   Authorization Type Medicaid: Kx mod by 15 visit, Progress note by 10th visit.    Activity Tolerance Patient tolerated treatment well   Behavior During Therapy [facility] for tasks assessed/performed      Past Medical History  Diagnosis Date  . Fibromyalgia   . Glaucoma     Past Surgical History  Procedure Laterality Date  . Eye surgery    . Hernia repair      There were no vitals filed for this visit.      Subjective Assessment - 01/29/16 1640    Subjective " I am feeling more sore today reported as an 8/10"   Currently in Pain? Yes   Pain Score 8    Pain Location Knee   Pain Orientation Right;Medial   Pain Descriptors / Indicators Sore;Throbbing   Pain Type Chronic pain   Aggravating Factors  standing/ walking   Pain Relieving Factors rest, ice, epson salts,                          [facility] Adult PT Treatment/Exercise - 01/29/16 0001    Knee/Hip Exercises: Aerobic   Recumbent Bike L4 x 10 min  LE only   Knee/Hip Exercises: Standing   Heel Raises 1 set;20 reps   Knee/Hip Exercises: Supine   Straight Leg Raise with External Rotation AROM;Strengthening;Right;3 sets;10 reps   Knee/Hip Exercises: Sidelying   Hip ABduction AROM;Strengthening;Right;2 sets;10 reps   Manual Therapy   Manual Therapy Taping   Manual therapy comments manual trigger point release x 5 Vastus Lateralis   Soft tissue mobilization IASTM over medial aspect of the R knee   McConnell Lateral > Medial taping of R knee  reported no pain following  taping                PT Education - 01/29/16 1723    Education provided Yes   Education Details mechanics and anatomy of the patella and how it moves with effect of the quads and benefit of the VMO on promoting alignment. Educated on Mconnel taping and beneifts.    Person(s) Educated Patient   Methods Explanation;Demonstration   Comprehension Verbalized understanding          PT Short Term Goals - 01/21/16 1714    PT SHORT TERM GOAL #1   Title pt will be I with initial HEP (02/08/2016)   Baseline independent   Time 4   Period Weeks   Status Achieved   PT SHORT TERM GOAL #2   Title pt will be able to verbalize techniques to control pain and inflammation via RICE (02/08/2016)   Baseline ises ice and elevation at home as needed,  brace for some compression   Time 4   Period Weeks   Status Achieved   PT SHORT TERM GOAL #3   Title pt will improve R knee flexion to >/= 105 degrees with </= 5/10 pain to assist with functional progression (02/08/2016)   Baseline 125 degrees AROM,  mildly uncomfortable  Time 4   Period Weeks   Status On-going   PT SHORT TERM GOAL #4   Title pt will improve R quad/ hamstring strength to >/= 4-/5 with </=5/10 pain to walking/ standing endurnace (02/08/2016)   Time 4   Period Weeks   Status Unable to assess           PT Long Term Goals - 01/21/16 1715    PT LONG TERM GOAL #4   Title pt will be able to walk/ stand for >/= 45 min with </= 2/10 pain for functional endurance required for ADLS and pt's personal goal of returning to exercising/walking (03/04/2016)   Baseline 2/10 pain with standing and walking 45 minutes.  Able to satand and walk at field day 3 hours with 4/10 pain.    Time 8   Period Weeks   Status On-going               Plan - 01/29/16 1724    Clinical Impression Statement Mr. Forstrom states he has 8/10 today. Following gentle aeorbic exercise on Nu-step he reproted relief to 5/10 pain but conitnued to report pain in the  anterior aspect of the R Knee. palpation revealed multiple trigger points in the vastus lateralis, and performed SLR with external rotation which pt demonstrated fatigue with to work on VMO activation. perofrmed Lateral > Medial McConnel taping and pt reported no pain with sitting or standing or walking and declined modalities.    PT Next Visit Plan Check knee strength, assess response to taping,  continue to work on ROM and strengthening,  review new exercise, assess response manual trigger point release, review goals   PT Home Exercise Plan SLR with external rotation   Consulted and Agree with Plan of Care Patient;Family member/caregiver   Family Member Consulted wife      Patient will benefit from skilled therapeutic intervention in order to improve the following deficits and impairments:  Abnormal gait, Pain, Improper body mechanics, Postural dysfunction, Decreased strength, Increased edema, Decreased balance, Decreased endurance, Decreased range of motion, Hypomobility  Visit Diagnosis: Stiffness of right knee, not elsewhere classified  Pain in right knee  Other abnormalities of gait and mobility  Muscle weakness (generalized)  Localized edema     Problem List Patient Active Problem List   Diagnosis Date Noted  . Acute medial meniscal tear 11/21/2015  . Right knee pain 11/08/2015  . Pain of right heel 11/08/2015  . Right ear pain 04/23/2015  . Fibromyalgia 03/15/2015  . Glaucoma 03/15/2015  . Legally blind 03/15/2015   Lulu Riding PT, DPT, LAT, ATC  01/29/2016  5:31 PM      [facility] [address] [place], [place], [address] Phone: [phone]   Fax:  [phone]  Name: Bryce VOLLMER Sr. MRN: [id number] Date of Birth: Jul 26, 1976

## 2016-01-30 ENCOUNTER — Telehealth: Payer: Self-pay

## 2016-01-30 NOTE — Telephone Encounter (Signed)
Patient called and wanted someone to call him back about his mri results. Please follow up. Thank you.

## 2016-01-30 NOTE — Telephone Encounter (Signed)
ACL sprain, not a full tear.  Moderate arthritis on the medial aspect of the knee.  No severe tear in the meniscus.  Can try injection, viscous-supplementation but does not appear surgical at this time.  Please call when yo u get a chance.

## 2016-01-30 NOTE — Telephone Encounter (Signed)
Called patient back and he has a app on June 19 to come in and talk about all this and maybe get an injection.

## 2016-01-30 NOTE — Telephone Encounter (Signed)
Spoke with patient as followup to MRI. Has appt June 19th

## 2016-02-04 ENCOUNTER — Ambulatory Visit: Payer: Medicare Other | Admitting: Physical Therapy

## 2016-02-04 DIAGNOSIS — R600 Localized edema: Secondary | ICD-10-CM

## 2016-02-04 DIAGNOSIS — M6281 Muscle weakness (generalized): Secondary | ICD-10-CM

## 2016-02-04 DIAGNOSIS — M25561 Pain in right knee: Secondary | ICD-10-CM

## 2016-02-04 DIAGNOSIS — M25661 Stiffness of right knee, not elsewhere classified: Secondary | ICD-10-CM

## 2016-02-04 DIAGNOSIS — R2689 Other abnormalities of gait and mobility: Secondary | ICD-10-CM

## 2016-02-04 NOTE — Therapy (Signed)
ATC  02/04/2016  5:44 PM     [facility] [address] [place], [place], [address] Phone: [phone]   Fax:  [phone]  Name: Bryce MOLLENKOPF Sr. MRN: [id number] Date of Birth: [birth year]  ATC  02/04/2016  5:44 PM     [facility] [address] [place], [place], [address] Phone: [phone]   Fax:  [phone]  Name: Bryce MOLLENKOPF Sr. MRN: [id number] Date of Birth: [birth year]  [facility] [address] [place], [place], [address] Phone: [phone]   Fax:  [phone]  Physical Therapy Treatment  Patient Details  Name: Bryce PAUWELS Sr. MRN: [id number] Date of Birth: July 13, 1976 Referring Provider: Antoine Primas MD  Encounter Date: 02/04/2016      PT End of Session - 02/04/16 1737    Visit Number 7   Number of Visits 17   Date for PT Re-Evaluation 03/04/16   Authorization Type Medicaid: Kx mod by 15 visit, Progress note by 10th visit.    PT Start Time 1633   PT Stop Time 1720   PT Time Calculation (min) 47 min   Activity Tolerance Patient tolerated treatment well   Behavior During Therapy WFL for tasks assessed/performed      Past Medical History  Diagnosis Date  . Fibromyalgia   . Glaucoma     Past Surgical History  Procedure Laterality Date  . Eye surgery    . Hernia repair      There were no vitals filed for this visit.      Subjective Assessment - 02/04/16 1637    Subjective "the tape held up pretty good, I finally talked them about getting my resutls from my MRI.   Currently in Pain? Yes   Pain Score 5    Pain Location Knee   Pain Orientation Right   Pain Descriptors / Indicators Sore;Throbbing   Pain Type Chronic pain   Pain Onset More than a month ago                         [facility] Adult PT Treatment/Exercise - 02/04/16 0001    Knee/Hip Exercises: Aerobic   Recumbent Bike L4 x 5 min   Knee/Hip Exercises: Standing   Heel Raises 1 set;20 reps   Knee/Hip Exercises: Seated   Long Arc Quad AROM;Strengthening;Right;3 sets;10 reps   Knee/Hip Exercises: Supine   Straight Leg Raise with External Rotation AROM;Strengthening;Right;3 sets;10 reps   Manual Therapy   Joint Mobilization seated tibiofemoral distraction with PROM knee flexion/ extension and internal/ external rotation with distraction   Soft tissue mobilization IASTM over medial aspect of the R knee   McConnell Lateral > Medial taping of R knee          Trigger Point Dry Needling - 02/04/16 1741    Consent Given? Yes   Education Handout Provided Yes   Muscles Treated Lower Body Quadriceps   Quadriceps Response Palpable increased muscle length;Twitch response elicited  R vastus Lateralis              PT Education - 02/04/16 1737    Education provided Yes   Education Details dry needling education, benefits of treatment and after care.    Person(s) Educated Patient   Methods Explanation;Handout   Comprehension Verbalized understanding          PT Short Term Goals - 01/21/16 1714    PT SHORT TERM GOAL #1   Title pt will be I with initial HEP (02/08/2016)   Baseline independent   Time 4   Period Weeks   Status Achieved   PT SHORT TERM GOAL #2   Title pt will be able to verbalize techniques to control pain and inflammation via RICE (02/08/2016)   Baseline ises ice and elevation at home as needed,  brace for some compression   Time 4   Period Weeks   Status Achieved   PT SHORT TERM

## 2016-02-06 ENCOUNTER — Ambulatory Visit: Payer: Medicare Other | Admitting: Physical Therapy

## 2016-02-11 ENCOUNTER — Encounter: Payer: Medicare Other | Admitting: Physical Therapy

## 2016-02-13 ENCOUNTER — Encounter: Payer: Medicare Other | Admitting: Physical Therapy

## 2016-02-18 ENCOUNTER — Ambulatory Visit: Payer: Medicare Other | Attending: Family Medicine | Admitting: Physical Therapy

## 2016-02-18 DIAGNOSIS — M25561 Pain in right knee: Secondary | ICD-10-CM | POA: Insufficient documentation

## 2016-02-18 DIAGNOSIS — R2689 Other abnormalities of gait and mobility: Secondary | ICD-10-CM | POA: Insufficient documentation

## 2016-02-18 DIAGNOSIS — M25661 Stiffness of right knee, not elsewhere classified: Secondary | ICD-10-CM | POA: Insufficient documentation

## 2016-02-18 DIAGNOSIS — R600 Localized edema: Secondary | ICD-10-CM | POA: Insufficient documentation

## 2016-02-18 DIAGNOSIS — M6281 Muscle weakness (generalized): Secondary | ICD-10-CM | POA: Insufficient documentation

## 2016-02-20 ENCOUNTER — Ambulatory Visit: Payer: Medicare Other | Admitting: Physical Therapy

## 2016-02-24 ENCOUNTER — Encounter: Payer: Self-pay | Admitting: Family Medicine

## 2016-02-24 ENCOUNTER — Ambulatory Visit (INDEPENDENT_AMBULATORY_CARE_PROVIDER_SITE_OTHER): Payer: Medicare Other | Admitting: Family Medicine

## 2016-02-24 VITALS — BP 112/82 | HR 72 | Ht 74.0 in | Wt 264.0 lb

## 2016-02-24 DIAGNOSIS — M1711 Unilateral primary osteoarthritis, right knee: Secondary | ICD-10-CM | POA: Insufficient documentation

## 2016-02-24 DIAGNOSIS — M1710 Unilateral primary osteoarthritis, unspecified knee: Secondary | ICD-10-CM | POA: Insufficient documentation

## 2016-02-24 DIAGNOSIS — M13861 Other specified arthritis, right knee: Secondary | ICD-10-CM

## 2016-02-24 DIAGNOSIS — M179 Osteoarthritis of knee, unspecified: Secondary | ICD-10-CM | POA: Insufficient documentation

## 2016-02-24 NOTE — Progress Notes (Signed)
Pre visit review using our clinic review tool, if applicable. No additional management support is needed unless otherwise documented below in the visit note. 

## 2016-02-24 NOTE — Assessment & Plan Note (Signed)
Moderate narrowing of the medial joint line with chondral thinning. We discussed with patient at great length. Patient has failed all other conservative therapy including formal physical therapy. If patient does not make improvement after the injection he would be a candidate for viscous supplementation. Can also consider custom bracing but I do not think a medial unloader brace would be significantly beneficial at this time. We will consider it though at follow-up. Has tramadol for breakthrough pain

## 2016-02-24 NOTE — Assessment & Plan Note (Signed)
Patient given injection today and tolerated the procedure well. We discussed icing regimen. We discussed the potential for a patellar strap. We discussed continuing the exercises. Patient come back again in 2-4 weeks. If worsening symptoms he would be a candidate for viscous supplementation with him feeling all other conservative therapy. Spent  25 minutes with patient face-to-face and had greater than 50% of counseling including as described above in assessment and plan.

## 2016-02-24 NOTE — Progress Notes (Signed)
[name] [address] [place], [place] [address] Phone: [phone] Subjective:    I'm seeing this patient by the request  of:  Myrlene Broker, MD Carver Fila, NP  CC: Right knee pain f/u   [id number] Bryce DRAHEIM Sr. is a 40 y.o. male coming in with complaint of right knee pain. Patient was seen previously and did have a fairly large posterior medial meniscal tear.  Patient is artery had an injection 2 months ago with mild improvement. Has tried with formal physical therapy and states that he is 30% better. Continued have pain as well as instability. Was sent for advance imaging. Patient's MRI was inability reviewed by me. Patient's meniscus seems to have good healing at this time but no significant displacement. Patient's anterior cruciate ligament did have a sprain and patient had moderate medial osteophytic changes. Continues to have discomfort especially when flexing the knee. Patient states when he tries go up and downstairs seems to be more difficult as well. Instability does seem to be anterior when asked.       Past Medical History  Diagnosis Date  . Fibromyalgia   . Glaucoma    Past Surgical History  Procedure Laterality Date  . Eye surgery    . Hernia repair     Social History   Social History  . Marital Status: Married    Spouse Name: N/A  . Number of Children: N/A  . Years of Education: N/A   Social History Main Topics  . Smoking status: Never Smoker   . Smokeless tobacco: None  . Alcohol Use: No  . Drug Use: No  . Sexual Activity: Not Asked   Other Topics Concern  . None   Social History Narrative   Allergies  Allergen Reactions  . Citrus Anaphylaxis  . Latex Rash   No family history on file.Family history glaucoma   Past medical history, social, surgical and family history all reviewed in electronic medical record.  No pertanent information unless stated regarding to the chief complaint.   Review of Systems:  No headache, visual changes, nausea, vomiting, diarrhea, constipation, dizziness, abdominal pain, skin rash, fevers, chills, night sweats, weight loss, swollen lymph nodes, body aches, joint swelling, muscle aches, chest pain, shortness of breath, mood changes.   Objective Blood pressure 112/82, pulse 72, height  (1.88 m), weight 264 lb (119.75 kg), SpO2 99 %.  General: No apparent distress alert and oriented x3 mood and affect normal, dressed appropriately. Wearing glasses HEENT: Pupils equal, extraocular movements intact  Respiratory: Patient's speak in full sentences and does not appear short of breath  Cardiovascular: No lower extremity edema, non tender, no erythema  Skin: Warm dry intact with no signs of infection or rash on extremities or on axial skeleton.  Abdomen: Soft nontender  Neuro: Cranial nerves II through XII are intact, neurovascularly intact in all extremities with 2+ DTRs and 2+ pulses.  Lymph: No lymphadenopathy of posterior or anterior cervical chain or axillae bilaterally.  Gait normal with good balance and coordination.  MSK:  Non tender with full range of motion and good stability and symmetric strength and tone of shoulders, elbows, wrist, hip, and ankles bilaterally.  Knee: Right No effusion today Continued tenderness over the medial joint line as well as over the patellofemoral joint ROM full in flexion and extension and lower leg rotation. Ligaments with solid consistent endpoints including ACL, PCL, LCL, MCL. Negative McMurray's painful patellar compression. Patellar glide with mild crepitus.  Patellar and quadriceps tendons unremarkable. Hamstring and quadriceps strength is normal.  Contralateral knee unremarkable   After informed written and verbal consent, patient was seated on exam table. Right knee was prepped with alcohol swab and utilizing anterolateral approach, patient's right knee space was injected with 4:1  marcaine 0.5%: Kenalog 40mg /dL.  Patient tolerated the procedure well without immediate complications.    Impression and Recommendations:     This case required medical decision making of moderate complexity.      Note: This dictation was prepared with Dragon dictation along with smaller phrase technology. Any transcriptional errors that result from this process are unintentional.

## 2016-02-24 NOTE — Patient Instructions (Signed)
Good to see you  Overall MRI not too bad of news and arthritis under the knee cap Ice is still good Patellar strap on inferior aspect of knee cap. This can change how it rubs See me again in 2-4 weeks and if pain is there we will start the other injections

## 2016-02-26 ENCOUNTER — Ambulatory Visit: Payer: Medicare Other | Admitting: Physical Therapy

## 2016-02-26 DIAGNOSIS — R2689 Other abnormalities of gait and mobility: Secondary | ICD-10-CM

## 2016-02-26 DIAGNOSIS — M25561 Pain in right knee: Secondary | ICD-10-CM | POA: Diagnosis not present

## 2016-02-26 DIAGNOSIS — M25661 Stiffness of right knee, not elsewhere classified: Secondary | ICD-10-CM

## 2016-02-26 DIAGNOSIS — M6281 Muscle weakness (generalized): Secondary | ICD-10-CM | POA: Diagnosis not present

## 2016-02-26 DIAGNOSIS — R600 Localized edema: Secondary | ICD-10-CM | POA: Diagnosis not present

## 2016-02-26 NOTE — Therapy (Addendum)
[place], [place], [address] Phone: [phone]   Fax:  [phone]  Physical Therapy Treatment / discharge Note  Patient Details  Name: Bryce [name]. MRN: [id number] Date of Birth: [birth year] Referring Provider: Hulan Saas MD  Encounter Date: 02/26/2016      PT End of Session - 02/26/16 1745    Visit Number 8   Number of Visits 17   Date for PT Re-Evaluation 03/04/16   PT Start Time 1633   PT Stop Time 1720   PT Time Calculation (min) 47 min   Activity Tolerance Patient tolerated treatment well;No increased pain   Behavior During Therapy [facility] for tasks assessed/performed      Past Medical History  Diagnosis Date  . Fibromyalgia   . Glaucoma     Past Surgical History  Procedure Laterality Date  . Eye surgery    . Hernia repair      There were no vitals filed for this visit.      Subjective Assessment - 02/26/16 1635    Subjective I was sore for 3 days after the dry needle.  The MD gave me a shot for under my kneecap yesterday. He says I am going to be sore due to rough patella and strained ACL.  3/10 pain, mostly sore in the mormings.  Sometimes i don't have pain.  I lost 8 pounds moving furniture into a new place. I feel like I can continue the exercises on my own.     Patient is accompained by: Family member  wife   Currently in Pain? Yes   Pain Score 3    Pain Location Knee   Pain Orientation Right   Pain Descriptors / Indicators Sore   Pain Frequency Intermittent   Aggravating Factors  first thing in the morning.  Kneeling , unable to do.   Pain Relieving Factors rest, ice            [facility] PT Assessment - 02/26/16 0001    Observation/Other Assessments   Other Surveys  Other Surveys   Lower Extremity Functional Scale  40/80   AROM   Right Knee Extension -6   Right Knee Flexion 128  no pain   Strength   Right Knee Flexion 4/5   Right Knee Extension 4+/5                      [facility] Adult PT Treatment/Exercise - 02/26/16 0001    Knee/Hip Exercises: Aerobic   Recumbent Bike L3, 5 minutes   Knee/Hip Exercises: Machines for Strengthening   Cybex Knee Flexion 45 pounds 3 sets 10   Total Gym Leg Press 45 pounds 3 sets of 10   Hip Cybex 4 way 2-3 seyts of 10 each leg   Knee/Hip Exercises: Seated   Hamstring Curl 3 sets;10 reps  blue band.  Black band also issued for progression   Hamstring Limitations May not need bands if he returms to gym.                PT Education - 02/26/16 1730    Education provided Yes   Education Details Things to do at gym: hip 4 way,  Knee flexion,  leg press.  Also hamstring curls.  band   Person(s) Educated Patient;Spouse   Methods Explanation;Demonstration;Handout;Verbal cues   Comprehension Verbalized understanding;Returned demonstration          PT Short Term Goals - 02/26/16 1752  PT SHORT TERM GOAL #1   Title pt will be I with initial HEP (02/08/2016)   Baseline independent   Time 4   Period Weeks   Status Achieved   PT SHORT TERM GOAL #2   Title pt will be able to verbalize techniques to control pain and inflammation via RICE (02/08/2016)   Time 4   Period Weeks   Status Achieved   PT SHORT TERM GOAL #3   Title pt will improve R knee flexion to >/= 105 degrees with </= 5/10 pain to assist with functional progression (02/08/2016)   Baseline 128  AROM, 0 pain (02/26/2016)   Time 4   Period Weeks   Status Achieved   PT SHORT TERM GOAL #4   Title pt will improve R quad/ hamstring strength to >/= 4-/5 with </=5/10 pain to walking/ standing endurnace (02/08/2016)   Baseline 4+/5 quads, 4/5 hamstrings (02/26/2016)   Time 4   Period Weeks   Status Achieved           PT Long Term Goals - 02/26/16 1754    PT LONG TERM GOAL #1   Title pt will be I with all HEP as of last visit (03/04/2016)   Baseline independent (02/26/2016)   Time 8   Period Weeks   Status Achieved   PT LONG  TERM GOAL #2   Title pt will improve R knee flexion to >/= 120 degrees and extension to </= -3 degrees with </= 2/10 pain to promote a functional and efficient gait pattern (03/04/2016)   Baseline 128 flexion, -6 extension,  no painm (02/26/2016)   Time 8   Period Weeks   Status Partially Met   PT LONG TERM GOAL #3   Title pt will improve R knee/ hip strength to >/= 4+/5 to assist with prolonged walking and standing activities (03/04/2016)   Baseline Quads 4+/5,  Hamstrings 4/5,  Hips not tested but able to work with 37.5 pounds on Hip cybex 4 way.  (02/26/2016)   Time 8   Period Weeks   Status Partially Met   PT LONG TERM GOAL #4   Title pt will be able to walk/ stand for >/= 45 min with </= 2/10 pain for functional endurance required for ADLS and pt's personal goal of returning to exercising/walking (03/04/2016)   Baseline Patient unsure what to answer here.  He did note on LEFS that he could walk 1 mile with a little bit of difficulty.  (02/26/2016)   Time 8   Period Weeks   Status Partially Met   PT LONG TERM GOAL #5   Title pt will improve his FOTO score to >/=60 to demonstrate improvement in function at discharge (03/04/2016)   Baseline LEFS 40/80 (02/26/2016)   Time 8   Period Weeks   Status Partially Met               Plan - 02/26/16 1745    Clinical Impression Statement Mr Sprinkle agrees he is ready for discharge,  He is independent with his home exercises.  Today I showed him some safe exercises he can do at a gym should he decide he wants to do:  hamstring curls, leg press and hip 4 way.  His pain , strength  ROM have improved.  All STG's met, LTG # 1 met,  other LTG'S were partially met.    PT Next Visit Plan Discharge to home program per PT instruction.  Patient and wife agree   PT Home  Exercise Plan Hamstring bands and his current home exercises.  He is considering a return to the gym.    Consulted and Agree with Plan of Care Patient;Family member/caregiver   Family Member  Consulted wife      Patient will benefit from skilled therapeutic intervention in order to improve the following deficits and impairments:  Abnormal gait, Pain, Improper body mechanics, Postural dysfunction, Decreased strength, Increased edema, Decreased balance, Decreased endurance, Decreased range of motion, Hypomobility  Visit Diagnosis: Stiffness of right knee, not elsewhere classified  Pain in right knee  Other abnormalities of gait and mobility  Muscle weakness (generalized)  Localized edema     Problem List Patient Active Problem List   Diagnosis Date Noted  . Patellofemoral arthritis of right knee 02/24/2016  . Degenerative arthritis of knee 02/24/2016  . Acute medial meniscal tear 11/21/2015  . Right knee pain 11/08/2015  . Pain of right heel 11/08/2015  . Right ear pain 04/23/2015  . Fibromyalgia 03/15/2015  . Glaucoma 03/15/2015  . Legally blind 03/15/2015    [facility] 02/26/2016, 6:01 PM  [facility] [address] [place], [place], [address] Phone: [phone]   Fax:  [phone]  Name: FRANCE NOYCE [name]. MRN: [id number] Date of Birth: 09-Sep-1975    Melvenia Needles, PTA 02/26/2016 6:01 PM Phone: [phone] Fax: [phone]   PHYSICAL THERAPY DISCHARGE SUMMARY  Visits from Start of Care: 8  Current functional level related to goals / functional outcomes: See goals   Remaining deficits: Intermittent pain in the R knee. Mild limitation with knee extension compared bil. Mild limitation with knee extension strength secondary to pain during testinging.  Limited endurance with weight bearing activities including walking/ standing.    Education / Equipment: HEP, theraband for strengthening, patellofemoral biomechanics  Plan: Patient agrees to discharge.  Patient goals were partially met. Patient is being discharged due to being pleased with the current functional level.  ?????          Kristoffer Leamon PT, DPT, LAT, ATC  02/27/2016  7:58 AM

## 2016-02-26 NOTE — Patient Instructions (Signed)
Hamstring Curl: Resisted (Sitting)    Facing anchor with tubing on right ankle, leg straight out, bend knee. Repeat _10___ times per set. Do __3__ sets per session. Do __1__ sessions per day.   3-4 X a week Start with blue band work up to black band.   http://orth.exer.us/668   Copyright  VHI. All rights reserved.

## 2016-02-27 ENCOUNTER — Ambulatory Visit: Payer: Medicare Other | Admitting: Physical Therapy

## 2016-03-04 ENCOUNTER — Encounter: Payer: Medicare Other | Admitting: Physical Therapy

## 2016-03-18 ENCOUNTER — Ambulatory Visit: Payer: Medicare Other | Admitting: Family Medicine

## 2016-03-19 ENCOUNTER — Ambulatory Visit (INDEPENDENT_AMBULATORY_CARE_PROVIDER_SITE_OTHER): Payer: Medicare Other | Admitting: Family Medicine

## 2016-03-19 ENCOUNTER — Encounter: Payer: Self-pay | Admitting: Family Medicine

## 2016-03-19 DIAGNOSIS — M1711 Unilateral primary osteoarthritis, right knee: Secondary | ICD-10-CM

## 2016-03-19 DIAGNOSIS — M13861 Other specified arthritis, right knee: Secondary | ICD-10-CM | POA: Diagnosis not present

## 2016-03-19 NOTE — Progress Notes (Signed)
[name] [address] [place], [place] [address] Phone: [phone] Subjective:    I'm seeing this patient by the request  of:  Myrlene Broker, MD Carver Fila, NP  CC: Right knee pain f/u   HQI:[id number] [name] is a 40 y.o. male coming in with complaint of right knee pain. Patient was seen previously and did have a fairly large posterior medial meniscal tear.  Patient is artery had an injection 2 months ago with mild improvement. Has tried with formal physical therapy and states that he is 30% better. Continued have pain as well as instability. Was sent for advance imaging. Patient's MRI was inability reviewed by me. Patient's meniscus seems to have good healing at this time but no significant displacement. Patient's anterior cruciate ligament did have a sprain and patient had moderate medial osteophytic changes.   Patient was seen previously and was given an injection and knee. States that he is feeling 90-95% better. Has completely finish with formal physical therapy. Feels like he is doing very well. Once to get more active.       Past Medical History  Diagnosis Date  . Fibromyalgia   . Glaucoma    Past Surgical History  Procedure Laterality Date  . Eye surgery    . Hernia repair     Social History   Social History  . Marital Status: Married    Spouse Name: N/A  . Number of Children: N/A  . Years of Education: N/A   Social History Main Topics  . Smoking status: Never Smoker   . Smokeless tobacco: None  . Alcohol Use: No  . Drug Use: No  . Sexual Activity: Not Asked   Other Topics Concern  . None   Social History Narrative   Allergies  Allergen Reactions  . Citrus Anaphylaxis  . Latex Rash   No family history on file.Family history glaucoma   Past medical history, social, surgical and family history all reviewed in electronic medical record.  No pertanent information unless stated regarding to the chief complaint.    Review of Systems: No headache, visual changes, nausea, vomiting, diarrhea, constipation, dizziness, abdominal pain, skin rash, fevers, chills, night sweats, weight loss, swollen lymph nodes, body aches, joint swelling, muscle aches, chest pain, shortness of breath, mood changes.   Objective There were no vitals taken for this visit.  General: No apparent distress alert and oriented x3 mood and affect normal, dressed appropriately. Wearing glasses HEENT: Pupils equal, extraocular movements intact  Respiratory: Patient's speak in full sentences and does not appear short of breath  Cardiovascular: No lower extremity edema, non tender, no erythema  Skin: Warm dry intact with no signs of infection or rash on extremities or on axial skeleton.  Abdomen: Soft nontender  Neuro: Cranial nerves II through XII are intact, neurovascularly intact in all extremities with 2+ DTRs and 2+ pulses.  Lymph: No lymphadenopathy of posterior or anterior cervical chain or axillae bilaterally.  Gait normal with good balance and coordination.  MSK:  Non tender with full range of motion and good stability and symmetric strength and tone of shoulders, elbows, wrist, hip, and ankles bilaterally.  Knee: Right No effusion today Minimal tenderness over the medial joint line ROM full in flexion and extension and lower leg rotation. Ligaments with solid consistent endpoints including ACL, PCL, LCL, MCL. Negative McMurray's painful patellar compression. Patellar glide with mild crepitus. Patellar and quadriceps tendons unremarkable. Hamstring and quadriceps strength is normal.  Contralateral knee unremarkable       Impression and Recommendations:     This case required medical decision making of moderate complexity.      Note: This dictation was prepared with Dragon dictation along with smaller phrase technology. Any transcriptional errors that result from this process are unintentional.

## 2016-03-19 NOTE — Patient Instructions (Signed)
I am glad you are doing better.  Ice is good.  Make those children make a brace in your size.  Stay active but no running or jumping See me again when you need me.  We will hold on the series of injections now.

## 2016-03-19 NOTE — Assessment & Plan Note (Signed)
Patient responded fairly well to the home exercises. We discussed icing regimen. Discussed which exited is a do in which ones to potentially avoid. Patient will continue to be active and can follow-up with me as needed.

## 2016-03-19 NOTE — Progress Notes (Signed)
Pre visit review using our clinic review tool, if applicable. No additional management support is needed unless otherwise documented below in the visit note. 

## 2016-05-23 ENCOUNTER — Ambulatory Visit (INDEPENDENT_AMBULATORY_CARE_PROVIDER_SITE_OTHER): Payer: Medicare Other | Admitting: Osteopathic Medicine

## 2016-05-23 VITALS — BP 121/84 | HR 80 | Temp 98.5°F | Resp 16 | Ht 74.0 in | Wt 263.0 lb

## 2016-05-23 DIAGNOSIS — B9789 Other viral agents as the cause of diseases classified elsewhere: Secondary | ICD-10-CM

## 2016-05-23 DIAGNOSIS — J069 Acute upper respiratory infection, unspecified: Secondary | ICD-10-CM

## 2016-05-23 DIAGNOSIS — J329 Chronic sinusitis, unspecified: Secondary | ICD-10-CM | POA: Diagnosis not present

## 2016-05-23 MED ORDER — GUAIFENESIN-CODEINE 100-10 MG/5ML PO SYRP: 5.0000 mL | ORAL_SOLUTION | Freq: Three times a day (TID) | ORAL | 0 refills | Status: DC | PRN

## 2016-05-23 MED ORDER — AMOXICILLIN-POT CLAVULANATE 875-125 MG PO TABS: 1.0000 | ORAL_TABLET | Freq: Two times a day (BID) | ORAL | 0 refills | Status: DC

## 2016-05-23 MED ORDER — FLUTICASONE PROPIONATE 50 MCG/ACT NA SUSP: 2.0000 | Freq: Every day | NASAL | 0 refills | Status: DC

## 2016-05-23 NOTE — Patient Instructions (Addendum)
Most likely your symptoms are due to viral upper respiratory illness causing postnasal drip, sore throat, cough, sinus congestion. The common cold is not something that we can cure, but we can help control symptoms while your body fights the infection. You've been given prescription for nasal spray to help with sinus drainage, plus a prescription for cough medicine, and a prescription for antibiotics to fill if these and other over-thr-counter medicines are not helping. Other over-the-counter remedies which are typically helpful include taking all of the following together: Ibuprofen, Tylenol. Most people can safely take Sudafed and Benadryl, but this is risky in patients with glaucoma. Use caution, many generics are sold in cold/flu formulations as combination, always ask a pharmacist if you are concerned about any medication interactions or duplications. Look for lozenges which contain menthol plus benzocaine, these will help numb the throat and prevent cough. Be sure you're staying well hydrated with plenty of water or warm tea with honey. Most people feel better from a viral upper respiratory infection in 7-10 days, though cough symptoms can linger for several weeks. Please let us know if you're not getting better or if you get worse.

## 2016-05-23 NOTE — Progress Notes (Signed)
HPI: Bryce Pillarlvin L Mateo Sr. is a 40 y.o. Not Hispanic or Latino male  who presents to [facility] today, 05/23/16,  for chief complaint of:  Chief Complaint  Patient presents with  . Sinusitis  . bodyaches  . Nasal Congestion  . Cough    coughing up yellow stuff  . Sore Throat    scratchy  . sneezing  . Flu Vaccine    would like it if he can get it    . Context: ill feeling as noted in CC . Location/Quality: generalized aches, sinus congestion, mild productive cough . Duration: most of this week  . Modifying factors: has tried OTC benadryl, Tylenol, Dimettapp   Past medical, surgical, social and family history reviewed: Past Medical History:  Diagnosis Date  . Allergy   . Cataract   . Fibromyalgia   . Glaucoma    Past Surgical History:  Procedure Laterality Date  . EYE SURGERY    . HERNIA REPAIR     Social History  Substance Use Topics  . Smoking status: Never Smoker  . Smokeless tobacco: Never Used  . Alcohol use No   Family History  Problem Relation Age of Onset  . Diabetes Mother   . Hypertension Mother   . Hyperlipidemia Father   . Hypertension Father   . Stroke Father   . Hypertension Brother   . Cancer Maternal Grandmother   . Cancer Paternal Grandfather      Current medication list and allergy/intolerance information reviewed:   Current Outpatient Prescriptions  Medication Sig Dispense Refill  . brimonidine (ALPHAGAN) 0.2 % ophthalmic solution Place 1 drop into both eyes 2 (two) times daily.     . diazepam (VALIUM) 5 MG tablet Take 1 tablet (5 mg total) by mouth every 6 (six) hours as needed for anxiety (spasms). 10 tablet 0  . dorzolamide-timolol (COSOPT) 22.3-6.8 MG/ML ophthalmic solution Place 1 drop into both eyes 2 (two) times daily.    . DULoxetine (CYMBALTA) 30 MG capsule Take 1 capsule (30 mg total) by mouth daily. 90 capsule 3  . gabapentin (NEURONTIN) 300 MG capsule Take 1 capsule (300 mg total) by mouth 3 (three) times  daily. 270 capsule 3  . travoprost, benzalkonium, (TRAVATAN) 0.004 % ophthalmic solution Place 1 drop into both eyes at bedtime.     No current facility-administered medications for this visit.    Allergies  Allergen Reactions  . Citrus Anaphylaxis  . Latex Rash      Review of Systems:  Constitutional:  No  fever, no chills, +recent illness, No unintentional weight changes. +significant fatigue.   HEENT: +headache, no vision change, no hearing change, +sore throat, +sinus pressure  Cardiac: No  chest pain, No  pressure, No palpitations  Respiratory:  No  shortness of breath. +Cough  Gastrointestinal: No  abdominal pain, No  nausea, No  vomiting  Musculoskeletal: No new myalgia/arthralgia  Skin: No  Rash,   Exam:  BP 121/84 (BP Location: Right Arm, Patient Position: Sitting, Cuff Size: Large)   Pulse 80   Temp 98.5 F (36.9 C) (Oral)   Resp 16   Ht 6\' 2"  (1.88 m)   Wt 263 lb (119.3 kg)   SpO2 98%   BMI 33.77 kg/m   Constitutional: VS see above. General Appearance: alert, well-developed, well-nourished, NAD  Eyes: Normal lids and conjunctive, non-icteric sclera  Ears, Nose, Mouth, Throat: MMM, Normal external inspection ears/nares/mouth/lips/gums. TM normal bilaterally. Pharynx/tonsils no erythema, no exudate. Nasal mucosa normal.  Neck: No masses, trachea midline. No thyroid enlargement. No tenderness/mass appreciated. No lymphadenopathy  Respiratory: Normal respiratory effort. no wheeze, no rhonchi, no rales  Cardiovascular: S1/S2 normal, no murmur, no rub/gallop auscultated. RRR. No lower extremity edema.   Skin: warm, dry, intact. No rash/ulcer.    ASSESSMENT/PLAN:   Rhinosinusitis - Plan: guaiFENesin-codeine (ROBITUSSIN AC) 100-10 MG/5ML syrup, amoxicillin-clavulanate (AUGMENTIN) 875-125 MG tablet, fluticasone (FLONASE) 50 MCG/ACT nasal spray, DISCONTINUED: amoxicillin-clavulanate (AUGMENTIN) 875-125 MG tablet, DISCONTINUED: fluticasone (FLONASE) 50  MCG/ACT nasal spray  Viral URI with cough   Patient Instructions  Most likely your symptoms are due to viral upper respiratory illness causing postnasal drip, sore throat, cough, sinus congestion. The common cold is not something that we can cure, but we can help control symptoms while your body fights the infection. You've been given prescription for nasal spray to help with sinus drainage, plus a prescription for cough medicine, and a prescription for antibiotics to fill if these and other over-thr-counter medicines are not helping. Other over-the-counter remedies which are typically helpful include taking all of the following together: Ibuprofen, Tylenol. Most people can safely take Sudafed and Benadryl, but this is risky in patients with glaucoma. Use caution, many generics are sold in cold/flu formulations as combination, always ask a pharmacist if you are concerned about any medication interactions or duplications. Look for lozenges which contain menthol plus benzocaine, these will help numb the throat and prevent cough. Be sure you're staying well hydrated with plenty of water or warm tea with honey. Most people feel better from a viral upper respiratory infection in 7-10 days, though cough symptoms can linger for several weeks. Please let us know if you're not getting better or if you get worse.     Visit summary with medication list and pertinent instructions was printed for patient to review. All questions at time of visit were answered - patient instructed to contact office with any additional concerns. ER/RTC precautions were reviewed with the patient. Follow-up plan: Return if symptoms worsen or fail to improve.

## 2016-07-22 DIAGNOSIS — R05 Cough: Secondary | ICD-10-CM | POA: Diagnosis not present

## 2016-07-22 DIAGNOSIS — J069 Acute upper respiratory infection, unspecified: Secondary | ICD-10-CM | POA: Diagnosis not present

## 2016-08-27 DIAGNOSIS — H401133 Primary open-angle glaucoma, bilateral, severe stage: Secondary | ICD-10-CM | POA: Diagnosis not present

## 2016-10-15 ENCOUNTER — Other Ambulatory Visit: Payer: Self-pay | Admitting: Physician Assistant

## 2016-10-15 ENCOUNTER — Ambulatory Visit (INDEPENDENT_AMBULATORY_CARE_PROVIDER_SITE_OTHER): Payer: Medicare Other | Admitting: Family Medicine

## 2016-10-15 VITALS — BP 112/78 | HR 106 | Temp 99.6°F | Ht 74.0 in | Wt 264.0 lb

## 2016-10-15 DIAGNOSIS — J111 Influenza due to unidentified influenza virus with other respiratory manifestations: Secondary | ICD-10-CM

## 2016-10-15 MED ORDER — GUAIFENESIN-CODEINE 100-10 MG/5ML PO SOLN: 5.0000 mL | Freq: Three times a day (TID) | ORAL | 0 refills | Status: DC | PRN

## 2016-10-15 MED ORDER — OSELTAMIVIR PHOSPHATE 75 MG PO CAPS: 75.0000 mg | ORAL_CAPSULE | Freq: Two times a day (BID) | ORAL | 0 refills | Status: DC

## 2016-10-15 MED ORDER — GUAIFENESIN-CODEINE 200-10 MG/5ML PO LIQD: 5.0000 mL | Freq: Three times a day (TID) | ORAL | 0 refills | Status: DC | PRN

## 2016-10-15 NOTE — Patient Instructions (Addendum)
  You do have flu.  Get some rest as much as possible.  Use the cough syrup to help with your cough.  This may make you sleepy so be careful during the day.    I have also sent in tamiflu for you as well.  Let us know if you're not feeling better in a week.    IF you received an x-ray today, you will receive an invoice from [facility]. Please contact [facility] at [phone] with questions or concerns regarding your invoice.   IF you received labwork today, you will receive an invoice from [facility]. Please contact [facility] at [phone] with questions or concerns regarding your invoice.   Our billing staff will not be able to assist you with questions regarding bills from these companies.  You will be contacted with the lab results as soon as they are available. The fastest way to get your results is to activate your My Chart account. Instructions are located on the last page of this paperwork. If you have not heard from us regarding the results in 2 weeks, please contact this office.

## 2016-10-15 NOTE — Progress Notes (Signed)
Per pharmacy, they do not have the guaifenesin-codeine 200-10mg /765ml liquid they only have guaifenesin-codeine 100-10mg /735ml liquid. I have given a prescription for this.   Meds ordered this encounter  Medications  . guaiFENesin-codeine 100-10 MG/5ML syrup    Sig: Take 5 mLs by mouth 3 (three) times daily as needed for cough.    Dispense:  120 mL    Refill:  0    Order Specific Question:   Supervising Provider    Answer:   Ethelda ChickSMITH, KRISTI M [[id number]]

## 2016-10-15 NOTE — Progress Notes (Signed)
   SUBJECTIVE: Flu like symptoms:  Present for 1.5 days.  Started with sore throat and runny nose.  Went to bed and awoke yesterday morning "feeling like I was in a car accident."  Sore all over.  Cough and headaches plus fever to 101 at home.  Wife recently diagnosed with flu.  Diffiuclty sleepign at night due to cough.  Chills.  Some nausea but no vomiting.    No chest pain.  NO dyspnea.  No abdominal pain.    PMH reviewed. Patient is a nonsmoker.   Medications reviewed.  Physical Exam:  BP 112/78   Pulse (!) 106   Temp 99.6 F (37.6 C) (Oral)   Ht 6\' 2"  (1.88 m)   Wt 264 lb (119.7 kg)   SpO2 97%   BMI 33.90 kg/m  Gen:  Patient sitting on exam table, appears stated age in no acute distress.  Somewhat ill-appearing, but nontoxic.  Head: Normocephalic atraumatic Eyes: EOMI, PERRL, sclera and conjunctiva non-erythematous Ears:  Canals clear bilaterally.  TMs pearly gray bilaterally without erythema or bulging.   Nose: some clear discharge BL.   Mouth: Mucosa membranes moist. Tonsils +2, nonenlarged, non-erythematous. Neck: No cervical lymphadenopathy noted Heart:  RRR, no murmurs auscultated. Pulm:  Clear to auscultation bilaterally with good air movement.  No wheezes or rales noted.   Abd:  Benign Ext:  No LE edema.  Assessment and Plan:  1.  Influenza: - plan to treat with Tamiflu - Guaif-codeine for cough and symptomatic relief. - Continue anti-pyretics - FU if no improvement. .[name]

## 2016-11-23 DIAGNOSIS — H401133 Primary open-angle glaucoma, bilateral, severe stage: Secondary | ICD-10-CM | POA: Diagnosis not present

## 2017-02-22 DIAGNOSIS — H401133 Primary open-angle glaucoma, bilateral, severe stage: Secondary | ICD-10-CM | POA: Diagnosis not present

## 2017-06-07 DIAGNOSIS — H401133 Primary open-angle glaucoma, bilateral, severe stage: Secondary | ICD-10-CM | POA: Diagnosis not present

## 2017-07-22 ENCOUNTER — Ambulatory Visit (INDEPENDENT_AMBULATORY_CARE_PROVIDER_SITE_OTHER): Payer: Medicare Other | Admitting: Physician Assistant

## 2017-07-22 VITALS — BP 126/84 | HR 89 | Temp 98.6°F | Resp 16 | Ht 74.0 in | Wt 274.0 lb

## 2017-07-22 DIAGNOSIS — K122 Cellulitis and abscess of mouth: Secondary | ICD-10-CM | POA: Diagnosis not present

## 2017-07-22 DIAGNOSIS — R070 Pain in throat: Secondary | ICD-10-CM

## 2017-07-22 LAB — POCT RAPID STREP A (OFFICE): RAPID STREP A SCREEN: NEGATIVE

## 2017-07-22 MED ORDER — AMOXICILLIN 500 MG PO CAPS: 500.0000 mg | ORAL_CAPSULE | Freq: Two times a day (BID) | ORAL | 0 refills | Status: DC

## 2017-07-22 NOTE — Progress Notes (Signed)
PRIMARY CARE AT [facility] [address], [place] [address] [phone]  Date:  07/22/2017   Name:  Bryce BUETOW Sr.   DOB:  1976-07-29   MRN:  [id number]  PCP:  Myrlene Broker, MD    History of Present Illness:  Bryce Pillar Sr. is a 41 y.o. male patient who presents to PCP with  Chief Complaint  Patient presents with  . Sore Throat    x 3 days     Patient reports 3 days of sore throat.  Patient states today that it became terribly sore.  No sob or dyspnea.  There is no associated abnormal congestion, cough, or ear discomfort.  Patient has a history of glaucoma.    Patient Active Problem List   Diagnosis Date Noted  . Patellofemoral arthritis of right knee 02/24/2016  . Degenerative arthritis of knee 02/24/2016  . Acute medial meniscal tear 11/21/2015  . Right knee pain 11/08/2015  . Pain of right heel 11/08/2015  . Right ear pain 04/23/2015  . Fibromyalgia 03/15/2015  . Glaucoma 03/15/2015  . Legally blind 03/15/2015    Past Medical History:  Diagnosis Date  . Allergy   . Cataract   . Fibromyalgia   . Glaucoma     Past Surgical History:  Procedure Laterality Date  . EYE SURGERY    . HERNIA REPAIR      Social History   Tobacco Use  . Smoking status: Never Smoker  . Smokeless tobacco: Never Used  Substance Use Topics  . Alcohol use: No  . Drug use: No    Family History  Problem Relation Age of Onset  . Diabetes Mother   . Hypertension Mother   . Hyperlipidemia Father   . Hypertension Father   . Stroke Father   . Hypertension Brother   . Cancer Maternal Grandmother   . Cancer Paternal Grandfather     Allergies  Allergen Reactions  . Citrus Anaphylaxis  . Latex Rash    Medication list has been reviewed and updated.  Current Outpatient Medications on File Prior to Visit  Medication Sig Dispense Refill  . brimonidine (ALPHAGAN) 0.2 % ophthalmic solution Place 1 drop into both eyes 2 (two) times daily.     . dorzolamide-timolol  (COSOPT) 22.3-6.8 MG/ML ophthalmic solution Place 1 drop into both eyes 2 (two) times daily.    . DULoxetine (CYMBALTA) 30 MG capsule Take 1 capsule (30 mg total) by mouth daily. 90 capsule 3  . gabapentin (NEURONTIN) 300 MG capsule Take 1 capsule (300 mg total) by mouth 3 (three) times daily. 270 capsule 3  . travoprost, benzalkonium, (TRAVATAN) 0.004 % ophthalmic solution Place 1 drop into both eyes at bedtime.     No current facility-administered medications on file prior to visit.     ROS ROS otherwise unremarkable unless listed above.  Physical Examination: BP 126/84   Pulse 89   Temp 98.6 F (37 C) (Oral)   Resp 16   Ht 6\' 2"  (1.88 m)   Wt 274 lb (124.3 kg)   SpO2 98%   BMI 35.18 kg/m  Ideal Body Weight: Weight in (lb) to have BMI = 25: 194.3  Physical Exam  Constitutional: He is oriented to person, place, and time. He appears well-developed and well-nourished. No distress.  HENT:  Head: Normocephalic and atraumatic.  Uvula edematous.  Eyes: Conjunctivae and EOM are normal. Pupils are equal, round, and reactive to light.  Cardiovascular: Normal rate.  Pulmonary/Chest: Effort normal. No respiratory  distress.  Neurological: He is alert and oriented to person, place, and time.  Skin: Skin is warm and dry. He is not diaphoretic.  Psychiatric: He has a normal mood and affect. His behavior is normal.     Assessment and Plan: Bryce Pillar Sr. is a 41 y.o. male who is here today for cc of  Chief Complaint  Patient presents with  . Sore Throat    x 3 days   Uvulitis - Plan: POCT rapid strep A, amoxicillin (AMOXIL) 500 MG capsule  Throat pain - Plan: POCT rapid strep A, amoxicillin (AMOXIL) 500 MG capsule  Trena Platt, PA-C [facility] 11/16/20189:51 AM

## 2017-07-22 NOTE — Patient Instructions (Addendum)
Please take ibuprofen or tylenol for fever or pain I am getting a strep culture.  If this is negative, we can stop the medication.   You can also do cepacol lozenges for pain.    Uvulitis Uvulitis is infection or inflammation of the uvula. The uvula is the small, finger-like piece of tissue that hangs down at the back of your throat. What are the causes? This condition may be caused by:  An infection in the mouth or throat. This is the most common cause.  Trauma to the uvula. Causes of trauma include burning your mouth and heavy snoring.  Fluid build-up (edema). Edema can be triggered be an allergic reaction. Uvulitis that is caused by edema is called Quincke disease.  Inhaling irritants, such as chemical agents, smoke, or steam.  What are the signs or symptoms? Symptoms of this condition depend on the cause. Symptoms of uvulitis that is caused by infection include:  Red, swollen uvula.  Sore throat.  Fever.  Headache.  Swollen neck glands.  Symptoms of uvulitis that is caused by trauma, edema, or irritation include:  Red, swollen uvula.  Sore throat.  Trouble swallowing.  Choking or gagging.  Trouble breathing.  How is this diagnosed? This condition is diagnosed with a physical exam. You also may have tests, such as a throat culture and blood tests. How is this treated? Treatment for this condition depends on the cause. Treatment may involve:  Antibiotic medicine. Antibiotics may be prescribed if a bacterial infection is the cause.  Steroid medicine. Steroids may be given if edema is the cause.  Surgery to remove part of the uvula (partial uvulectomy).  Follow these instructions at home:  Rest as much as possible until your condition improves.  Drink enough fluid to keep your urine clear or pale yellow.  Take over-the-counter and prescription medicines only as told by your health care provider.  If you were prescribed an antibiotic medicine, take it as  told by your health care provider. Do not stop taking the antibiotic even if you start to feel better.  Use a cool-mist humidifier to ease irritation in your throat.  While your throat is sore: ? Eat soft foods or drink liquids, such as soup. ? Gargle with a salt-water mixture 3-4 times per day or as needed. To make a salt-water mixture, completely dissolve -1 tsp of salt in 1 cup of warm water.  Keep all follow-up visits as told by your health care provider. This is important. Contact a health care provider if:  You have a fever.  You have trouble eating.  Your symptoms do not get better.  Your symptoms come back after treatment. Get help right away if:  You have trouble breathing.  You have trouble swallowing. This information is not intended to replace advice given to you by your health care provider. Make sure you discuss any questions you have with your health care provider. Document Released: 04/03/2004 Document Revised: 04/26/2016 Document Reviewed: 11/14/2014 Elsevier Interactive Patient Education  2018 [employer]     IF you received an x-ray today, you will receive an invoice from [facility]. Please contact [facility] at [phone] with questions or concerns regarding your invoice.   IF you received labwork today, you will receive an invoice from [facility]. Please contact [facility] at [phone] with questions or concerns regarding your invoice.   Our billing staff will not be able to assist you with questions regarding bills from these companies.  You will be contacted with  the lab results as soon as they are available. The fastest way to get your results is to activate your My Chart account. Instructions are located on the last page of this paperwork. If you have not heard from us regarding the results in 2 weeks, please contact this office.      

## 2017-07-23 DIAGNOSIS — R070 Pain in throat: Secondary | ICD-10-CM | POA: Diagnosis not present

## 2017-07-23 NOTE — Addendum Note (Signed)
Addended by: Trena PlattENGLISH, STEPHANIE D on: 07/23/2017 10:09 AM   Modules accepted: Orders

## 2017-07-26 ENCOUNTER — Telehealth: Payer: Self-pay | Admitting: Physician Assistant

## 2017-07-26 LAB — CULTURE, GROUP A STREP

## 2017-07-26 NOTE — Telephone Encounter (Signed)
Copied from CRM [phone]. Topic: Quick Communication - See Telephone Encounter >> Jul 26, 2017  8:42 AM Clack, Princella PellegriniJessica D wrote: CRM for notification. See Telephone encounter for: Pt calling for labs results from 11/15, please adv. Also pt states that when he takes the medication it makes him cough and sneeze.  07/26/17.

## 2017-07-27 MED ORDER — AZITHROMYCIN 500 MG PO TABS: 500.0000 mg | ORAL_TABLET | Freq: Every day | ORAL | 0 refills | Status: DC

## 2017-07-27 NOTE — Telephone Encounter (Signed)
He can stop the amoxicillin. I will give him azithromycin daily for 5 days.  Please advise patient.

## 2017-07-27 NOTE — Telephone Encounter (Signed)
Judeth CornfieldStephanie,     I spoke with patient gave lab results, he states he isn't felling any better, advise he may need to be seen again.    He also states that every time he take the amoxicillin he has a coughing fit about 15 minutes later, no trouble breathing or swelling, but would like to stop taking if he can.        Advised patient he may need to be seen again, he wanted a message sent before he booked an appointment.     Please advise

## 2017-08-02 NOTE — Telephone Encounter (Signed)
Patient was advised.  

## 2017-09-13 DIAGNOSIS — H401133 Primary open-angle glaucoma, bilateral, severe stage: Secondary | ICD-10-CM | POA: Diagnosis not present

## 2017-10-03 NOTE — Progress Notes (Signed)
Bryce [name] D.O. [facility] [address] [place], [place] [address] Phone: [phone] Subjective:     CC: Back pain follow-up  BJY:[id number]:Subjective  Tilford Pillarlvin L Staples Sr. is a 42 y.o. male coming in with complaint of back pain. He has had pain for years. He has tried physical therapy and pharmacological treatments. He complains of a locking up sensation. He does have an "electricity" feeling but denies any radiating symptoms. Patient does have sharp pain sometimes with walking or after being seated for longer periods of time. Pain is day to day. History of fibromyalgia.  Patient rates the severity of pain is 6 out of 10.  Can stop him from activities.      Past Medical History:  Diagnosis Date  . Allergy   . Cataract   . Fibromyalgia   . Glaucoma    Past Surgical History:  Procedure Laterality Date  . EYE SURGERY    . HERNIA REPAIR     Social History   Socioeconomic History  . Marital status: Married    Spouse name: Not on file  . Number of children: Not on file  . Years of education: Not on file  . Highest education level: Not on file  Social Needs  . Financial resource strain: Not on file  . Food insecurity - worry: Not on file  . Food insecurity - inability: Not on file  . Transportation needs - medical: Not on file  . Transportation needs - non-medical: Not on file  Occupational History  . Not on file  Tobacco Use  . Smoking status: Never Smoker  . Smokeless tobacco: Never Used  Substance and Sexual Activity  . Alcohol use: No  . Drug use: No  . Sexual activity: Not on file  Other Topics Concern  . Not on file  Social History Narrative  . Not on file   Allergies  Allergen Reactions  . Citrus Anaphylaxis  . Latex Rash   Family History  Problem Relation Age of Onset  . Diabetes Mother   . Hypertension Mother   . Hyperlipidemia Father   . Hypertension Father   . Stroke Father   . Hypertension Brother   . Cancer Maternal Grandmother   . Cancer  Paternal Grandfather      Past medical history, social, surgical and family history all reviewed in electronic medical record.  No pertanent information unless stated regarding to the chief complaint.   Review of Systems:Review of systems updated and as accurate as of 10/03/17  No headache, visual changes, nausea, vomiting, diarrhea, constipation, dizziness, abdominal pain, skin rash, fevers, chills, night sweats, weight loss, swollen lymph nodes, body aches, joint swelling, muscle aches, chest pain, shortness of breath, mood changes.   Objective  There were no vitals taken for this visit. Systems examined below as of 10/03/17   General: No apparent distress alert and oriented x3 mood and affect normal, dressed appropriately.  HEENT: Pupils equal, extraocular movements intact  Respiratory: Patient's speak in full sentences and does not appear short of breath  Cardiovascular: No lower extremity edema, non tender, no erythema  Skin: Warm dry intact with no signs of infection or rash on extremities or on axial skeleton.  Abdomen: Soft nontender  Neuro: Cranial nerves II through XII are intact, neurovascularly intact in all extremities with 2+ DTRs and 2+ pulses.  Lymph: No lymphadenopathy of posterior or anterior cervical chain or axillae bilaterally.  Gait normal with good balance and coordination.  MSK:  Non  tender with full range of motion and good stability and symmetric strength and tone of shoulders, elbows, wrist, hip, knee and ankles bilaterally.  Back Exam:  Inspection: Loss of lordosis Motion: Flexion 35 deg, Extension 20 deg, Side Bending to 35 deg bilaterally,  Rotation to 35 deg bilaterally  SLR laying: Negative  XSLR laying: Negative  Palpable tenderness: Significant tenderness to palpation mostly in the thoracolumbar juncture as well as the lumbosacral juncture.[name] FABER: Positive right. Sensory change: Gross sensation intact to all lumbar and sacral dermatomes.  Reflexes:  2+ at both patellar tendons, 2+ at achilles tendons, Babinski's downgoing.  Strength at foot  Plantar-flexion: 5/5 Dorsi-flexion: 5/5 Eversion: 5/5 Inversion: 5/5  Leg strength  Quad: 5/5 Hamstring: 5/5 Hip flexor: 5/5 Hip abductors: 4/5 but symmetric Gait unremarkable.  Osteopathic findingst T7 extended rotated and side bent right i T9 extended rotated and side bent left L2 flexed rotated and side bent right Sacrum right on right     Impression and Recommendations:     This case required medical decision making of moderate complexity.      Note: This dictation was prepared with Dragon dictation along with smaller phrase technology. Any transcriptional errors that result from this process are unintentional.

## 2017-10-04 ENCOUNTER — Ambulatory Visit (INDEPENDENT_AMBULATORY_CARE_PROVIDER_SITE_OTHER): Payer: Medicare Other | Admitting: Family Medicine

## 2017-10-04 ENCOUNTER — Encounter: Payer: Self-pay | Admitting: Family Medicine

## 2017-10-04 ENCOUNTER — Ambulatory Visit (INDEPENDENT_AMBULATORY_CARE_PROVIDER_SITE_OTHER)
Admission: RE | Admit: 2017-10-04 | Discharge: 2017-10-04 | Disposition: A | Discharge status: Home / Self Care | Payer: Medicare Other | Source: Ambulatory Visit | Attending: Family Medicine | Admitting: Family Medicine

## 2017-10-04 VITALS — BP 110/84 | HR 71 | Ht 74.0 in

## 2017-10-04 DIAGNOSIS — M545 Low back pain, unspecified: Secondary | ICD-10-CM

## 2017-10-04 DIAGNOSIS — G8929 Other chronic pain: Secondary | ICD-10-CM

## 2017-10-04 DIAGNOSIS — M999 Biomechanical lesion, unspecified: Secondary | ICD-10-CM | POA: Diagnosis not present

## 2017-10-04 DIAGNOSIS — M549 Dorsalgia, unspecified: Secondary | ICD-10-CM | POA: Insufficient documentation

## 2017-10-04 MED ORDER — DULOXETINE HCL 20 MG PO CPEP: 40.0000 mg | ORAL_CAPSULE | Freq: Every day | ORAL | 3 refills | Status: AC

## 2017-10-04 NOTE — Assessment & Plan Note (Signed)
Neck pain is likely secondary to some of the fibromyalgia.  Doing the 40 mg of Cymbalta.  We discussed home exercises, icing regimen, which activities to do which wants to avoid.  Attempted osteopathic manipulation with some resolution of pain.  Home exercises given.  Follow-up again in 4 weeks

## 2017-10-04 NOTE — Patient Instructions (Signed)
Good to see you  Ice 20 minutes 2 times daily. Usually after activity and before bed. Xray downstairs Exercises 3 times a week.  Core strength will be good Tried manipulation today  Increase cymbalta to 40 mg  See me again in 3-4 weeks

## 2017-10-04 NOTE — Assessment & Plan Note (Signed)
Decision today to treat with OMT was based on Physical Exam  After verbal consent patient was treated with HVLA, ME, FPR techniques in  thoracic, lumbar and sacral areas  Patient tolerated the procedure well with improvement in symptoms  Patient given exercises, stretches and lifestyle modifications  See medications in patient instructions if given  Patient will follow up in 4 weeks 

## 2017-10-31 NOTE — Progress Notes (Signed)
Bryce [name] D.O. [facility] [address] [place], [place] [address] Phone: [phone] Subjective:    I'm seeing this patient by the request  of:    CC: Back pain follow-up  ONG:[id number]:Subjective  Tilford Pillarlvin L Eickholt Sr. is a 42 y.o. male coming in with complaint of back pain.  No joint.  Degenerative changes of multiple joints.  Patient was coming in with back pain.  Patient did have x-rays taken October 04, 2017 that were independently visualized by me showing no significant bony abnormality.  Patient was given home exercise, icing regimen and we started osteopathic manipulation.  Patient states that his back has been doing well since last visit. He had a massage on Saturday that also helped alleviate his pain.      Past Medical History:  Diagnosis Date  . Allergy   . Cataract   . Fibromyalgia   . Glaucoma    Past Surgical History:  Procedure Laterality Date  . EYE SURGERY    . HERNIA REPAIR     Social History   Socioeconomic History  . Marital status: Married    Spouse name: None  . Number of children: None  . Years of education: None  . Highest education level: None  Social Needs  . Financial resource strain: None  . Food insecurity - worry: None  . Food insecurity - inability: None  . Transportation needs - medical: None  . Transportation needs - non-medical: None  Occupational History  . None  Tobacco Use  . Smoking status: Never Smoker  . Smokeless tobacco: Never Used  Substance and Sexual Activity  . Alcohol use: No  . Drug use: No  . Sexual activity: None  Other Topics Concern  . None  Social History Narrative  . None   Allergies  Allergen Reactions  . Citrus Anaphylaxis  . Latex Rash   Family History  Problem Relation Age of Onset  . Diabetes Mother   . Hypertension Mother   . Hyperlipidemia Father   . Hypertension Father   . Stroke Father   . Hypertension Brother   . Cancer Maternal Grandmother   . Cancer Paternal Grandfather       Past medical history, social, surgical and family history all reviewed in electronic medical record.  No pertanent information unless stated regarding to the chief complaint.   Review of Systems:Review of systems updated and as accurate as of 11/01/17  No headache, visual changes, nausea, vomiting, diarrhea, constipation, dizziness, abdominal pain, skin rash, fevers, chills, night sweats, weight loss, swollen lymph nodes, body aches, joint swelling, chest pain, shortness of breath, mood changes.  Positive muscle aches  Objective  Blood pressure 100/74, pulse 78, height 6\' 2"  (1.88 m), weight 279 lb (126.6 kg), SpO2 97 %. Systems examined below as of 11/01/17   General: No apparent distress alert and oriented x3 mood and affect normal, dressed appropriately.  HEENT: extraocular movements intact  Respiratory: Patient's speak in full sentences and does not appear short of breath  Cardiovascular: No lower extremity edema, non tender, no erythema  Skin: Warm dry intact with no signs of infection or rash on extremities or on axial skeleton.  Abdomen: Soft nontender  Neuro: Cranial nerves II through XII are intact, neurovascularly intact in all extremities with 2+ DTRs and 2+ pulses.  Lymph: No lymphadenopathy of posterior or anterior cervical chain or axillae bilaterally.  Gait normal with good balance and coordination.  MSK:  Non tender with full range of motion  and good stability and symmetric strength and tone of shoulders, elbows, wrist, hip, knee and ankles bilaterally.   Osteopathic findings C2 flexed rotated and side bent right C4 flexed rotated and side bent left C7 flexed rotated and side bent left T3 extended rotated and side bent right T6 extended rotated and side bent left L1 flexed rotated and side bent right Sacrum right on right     Impression and Recommendations:     This case required medical decision making of moderate complexity.      Note: This dictation  was prepared with Dragon dictation along with smaller phrase technology. Any transcriptional errors that result from this process are unintentional.

## 2017-11-01 ENCOUNTER — Ambulatory Visit (INDEPENDENT_AMBULATORY_CARE_PROVIDER_SITE_OTHER): Payer: Medicare Other | Admitting: Family Medicine

## 2017-11-01 ENCOUNTER — Encounter: Payer: Self-pay | Admitting: Family Medicine

## 2017-11-01 VITALS — BP 100/74 | HR 78 | Ht 74.0 in | Wt 279.0 lb

## 2017-11-01 DIAGNOSIS — G8929 Other chronic pain: Secondary | ICD-10-CM | POA: Diagnosis not present

## 2017-11-01 DIAGNOSIS — M999 Biomechanical lesion, unspecified: Secondary | ICD-10-CM

## 2017-11-01 DIAGNOSIS — M545 Low back pain, unspecified: Secondary | ICD-10-CM

## 2017-11-01 NOTE — Assessment & Plan Note (Signed)
Patient does have back pain.  Has responded well to osteopathic manipulation.  Patient encouraged to continue to work on the core strength.  Continue on the Cymbalta.  Follow-up again 6-8 weeks.

## 2017-11-01 NOTE — Patient Instructions (Signed)
Keep it up  See me again in 6-8 weeks

## 2017-11-01 NOTE — Assessment & Plan Note (Signed)
Decision today to treat with OMT was based on Physical Exam  After verbal consent patient was treated with HVLA, ME, FPR techniques in cervical, thoracic, lumbar and sacral areas  Patient tolerated the procedure well with improvement in symptoms  Patient given exercises, stretches and lifestyle modifications  See medications in patient instructions if given  Patient will follow up in 6-8 weeks 

## 2017-12-06 ENCOUNTER — Encounter: Payer: Self-pay | Admitting: Physician Assistant

## 2017-12-06 NOTE — Progress Notes (Signed)
Tawana ScaleZach Montine Hight D.O. [facility] [address] [place], [place] [address] Phone: [phone] Subjective:     CC: Low back pain  OZD:[id number]:Subjective  Bryce Pillarlvin L Avellino Sr. is a 42 y.o. male coming in with complaint of low back pain.  Seem to be more musculoskeletal.  History of fibromyalgia.  Responded well to osteopathic manipulation.  Patient states more tightness in the thoracolumbar juncture.  More on the left side.  No radiation of the legs as much.  Has had significant stress.  Did not have loss of his nephew recently. Patient is also complaining of right knee pain.  Was seen 2 years ago for this problem.  Was found to have more of a patellofemoral arthritis.  Patient does have some crepitus.  Feels like it will give out on him.  Has responded well to injections previously.  Feels like it swollen and getting more pain that is starting to give him some instability.       Past Medical History:  Diagnosis Date  . Allergy   . Cataract   . Fibromyalgia   . Glaucoma    Past Surgical History:  Procedure Laterality Date  . EYE SURGERY    . HERNIA REPAIR     Social History   Socioeconomic History  . Marital status: Married    Spouse name: Not on file  . Number of children: Not on file  . Years of education: Not on file  . Highest education level: Not on file  Occupational History  . Not on file  Social Needs  . Financial resource strain: Not on file  . Food insecurity:    Worry: Not on file    Inability: Not on file  . Transportation needs:    Medical: Not on file    Non-medical: Not on file  Tobacco Use  . Smoking status: Never Smoker  . Smokeless tobacco: Never Used  Substance and Sexual Activity  . Alcohol use: No  . Drug use: No  . Sexual activity: Not on file  Lifestyle  . Physical activity:    Days per week: Not on file    Minutes per session: Not on file  . Stress: Not on file  Relationships  . Social connections:    Talks on phone: Not on file    Gets  together: Not on file    Attends religious service: Not on file    Active member of club or organization: Not on file    Attends meetings of clubs or organizations: Not on file    Relationship status: Not on file  Other Topics Concern  . Not on file  Social History Narrative  . Not on file   Allergies  Allergen Reactions  . Citrus Anaphylaxis  . Latex Rash   Family History  Problem Relation Age of Onset  . Diabetes Mother   . Hypertension Mother   . Hyperlipidemia Father   . Hypertension Father   . Stroke Father   . Hypertension Brother   . Cancer Maternal Grandmother   . Cancer Paternal Grandfather      Past medical history, social, surgical and family history all reviewed in electronic medical record.  No pertanent information unless stated regarding to the chief complaint.   Review of Systems:Review of systems updated and as accurate as of 12/07/17  No headache, visual changes, nausea, vomiting, diarrhea, constipation, dizziness, abdominal pain, skin rash, fevers, chills, night sweats, weight loss, swollen lymph nodes, body aches, joint swelling, ,  chest pain, shortness of breath, mood changes.  Positive muscle aches  Objective  Blood pressure 138/84, pulse 70, height 6\' 2"  (1.88 m), weight 274 lb (124.3 kg), SpO2 98 %. Systems examined below as of 12/07/17   General: No apparent distress alert and oriented x3 mood and affect normal, dressed appropriately.  HEENT: Pupils equal, extraocular movements intact  Respiratory: Patient's speak in full sentences and does not appear short of breath  Cardiovascular: No lower extremity edema, non tender, no erythema  Skin: Warm dry intact with no signs of infection or rash on extremities or on axial skeleton.  Abdomen: Soft nontender  Neuro: Cranial nerves II through XII are intact, neurovascularly intact in all extremities with 2+ DTRs and 2+ pulses.  Lymph: No lymphadenopathy of posterior or anterior cervical chain or axillae  bilaterally.  Gait normal with good balance and coordination.  MSK:  Non tender with full range of motion and good stability and symmetric strength and tone of shoulders, elbows, wrist, hip and ankles bilaterally.   Right knee does have some crepitus with the patellofemoral joint.  Patient has minimal pain over the medial joint line.  No significant instability.  Positive patellar compression. Back Exam:  Inspection: Loss of lordosis Motion: Flexion 45 deg, Extension 15 deg, Side Bending to 35 deg bilaterally,  Rotation to 35 deg bilaterally  SLR laying: Negative  XSLR laying: Negative  Palpable tenderness: Tender to palpation in the thoracolumbar junction on the left side.[name] FABER: negative. Sensory change: Gross sensation intact to all lumbar and sacral dermatomes.  Reflexes: 2+ at both patellar tendons, 2+ at achilles tendons, Babinski's downgoing.  Strength at foot  Plantar-flexion: 5/5 Dorsi-flexion: 5/5 Eversion: 5/5 Inversion: 5/5  Leg strength  Quad: 5/5 Hamstring: 5/5 Hip flexor: 5/5 Hip abductors: 5/5  Gait unremarkable.   After informed written and verbal consent, patient was seated on exam table. Right knee was prepped with alcohol swab and utilizing anterolateral approach, patient's right knee space was injected with 4:1  marcaine 0.5%: Kenalog 40mg /dL. Patient tolerated the procedure well without immediate complications.  Osteopathic findings  T3 extended rotated and side bent right  T11 extended rotated and side bent left L3 flexed rotated and side bent right Sacrum right on right     Impression and Recommendations:     This case required medical decision making of moderate complexity.      Note: This dictation was prepared with Dragon dictation along with smaller phrase technology. Any transcriptional errors that result from this process are unintentional.

## 2017-12-07 ENCOUNTER — Encounter: Payer: Self-pay | Admitting: Family Medicine

## 2017-12-07 ENCOUNTER — Ambulatory Visit (INDEPENDENT_AMBULATORY_CARE_PROVIDER_SITE_OTHER): Payer: Medicare Other | Admitting: Family Medicine

## 2017-12-07 VITALS — BP 138/84 | HR 70 | Ht 74.0 in | Wt 274.0 lb

## 2017-12-07 DIAGNOSIS — G8929 Other chronic pain: Secondary | ICD-10-CM

## 2017-12-07 DIAGNOSIS — M545 Low back pain: Secondary | ICD-10-CM | POA: Diagnosis not present

## 2017-12-07 DIAGNOSIS — M1711 Unilateral primary osteoarthritis, right knee: Secondary | ICD-10-CM

## 2017-12-07 DIAGNOSIS — M999 Biomechanical lesion, unspecified: Secondary | ICD-10-CM | POA: Diagnosis not present

## 2017-12-07 NOTE — Patient Instructions (Signed)
Good to see you  Bryce [name] is your friend.  Injected the knee and should do well  Stay active.  For the back and neck see me again in 6 weeks!

## 2017-12-07 NOTE — Assessment & Plan Note (Signed)
Given injection.  Discussed icing regimen, HEP Discussed vitamin D

## 2017-12-07 NOTE — Assessment & Plan Note (Signed)
Seems to be more musculoskeletal still.  Discussed home exercise, patient has not doing the exercises regularly.  Underlying depression is likely contributing.  Follow-up again in 4 weeks

## 2017-12-07 NOTE — Assessment & Plan Note (Signed)
Decision today to treat with OMT was based on Physical Exam  After verbal consent patient was treated with HVLA, ME, FPR techniques in cervical, thoracic, lumbar and sacral areas  Patient tolerated the procedure well with improvement in symptoms  Patient given exercises, stretches and lifestyle modifications  See medications in patient instructions if given  Patient will follow up in 4 weeks 

## 2017-12-13 ENCOUNTER — Ambulatory Visit: Payer: Medicare Other | Admitting: Family Medicine

## 2018-01-06 ENCOUNTER — Ambulatory Visit (INDEPENDENT_AMBULATORY_CARE_PROVIDER_SITE_OTHER): Payer: Medicare Other | Admitting: Family Medicine

## 2018-01-06 ENCOUNTER — Encounter: Payer: Self-pay | Admitting: Family Medicine

## 2018-01-06 VITALS — BP 108/80 | HR 83 | Ht 74.0 in | Wt 294.0 lb

## 2018-01-06 DIAGNOSIS — M999 Biomechanical lesion, unspecified: Secondary | ICD-10-CM

## 2018-01-06 DIAGNOSIS — M1711 Unilateral primary osteoarthritis, right knee: Secondary | ICD-10-CM

## 2018-01-06 NOTE — Patient Instructions (Addendum)
Good to see you  Bryce [name] is your friend.  Stay active.  Injected knee  See me again for the back 4-6 weeks

## 2018-01-06 NOTE — Assessment & Plan Note (Signed)
   Decision today to treat with OMT was based on Physical Exam  After verbal consent patient was treated with HVLA, ME, FPR techniques in  thoracic, lumbar and sacral areas  Patient tolerated the procedure well with improvement in symptoms  Patient given exercises, stretches and lifestyle modifications  See medications in patient instructions if given  Patient will follow up in6 weeks 

## 2018-01-06 NOTE — Progress Notes (Signed)
[name] [address] [place], [place] [address] Phone: [phone] Subjective:    :    CC: Foot pain and knee pain follow-up  BJY:NWGNFAOZHY  Bryce [name] Sr. is a 42 y.o. male coming in with complaint of back pain and right knee pain. Patient tore his meniscus 2 year ago but continues to have pain which he believes is arthritis. Does use gabapentin and cymbalta for his pain.  Patient has been doing relatively well and has responded well to manipulation from the back.  Just worsening of previous symptoms.  No new symptoms.      Past Medical History:  Diagnosis Date  . Allergy   . Cataract   . Fibromyalgia   . Glaucoma    Past Surgical History:  Procedure Laterality Date  . EYE SURGERY    . HERNIA REPAIR     Social History   Socioeconomic History  . Marital status: Married    Spouse name: Not on file  . Number of children: Not on file  . Years of education: Not on file  . Highest education level: Not on file  Occupational History  . Not on file  Social Needs  . Financial resource strain: Not on file  . Food insecurity:    Worry: Not on file    Inability: Not on file  . Transportation needs:    Medical: Not on file    Non-medical: Not on file  Tobacco Use  . Smoking status: Never Smoker  . Smokeless tobacco: Never Used  Substance and Sexual Activity  . Alcohol use: No  . Drug use: No  . Sexual activity: Not on file  Lifestyle  . Physical activity:    Days per week: Not on file    Minutes per session: Not on file  . Stress: Not on file  Relationships  . Social connections:    Talks on phone: Not on file    Gets together: Not on file    Attends religious service: Not on file    Active member of club or organization: Not on file    Attends meetings of clubs or organizations: Not on file    Relationship status: Not on file  Other Topics Concern  . Not on file  Social History Narrative  . Not on file   Allergies    Allergen Reactions  . Citrus Anaphylaxis  . Latex Rash   Family History  Problem Relation Age of Onset  . Diabetes Mother   . Hypertension Mother   . Hyperlipidemia Father   . Hypertension Father   . Stroke Father   . Hypertension Brother   . Cancer Maternal Grandmother   . Cancer Paternal Grandfather      Past medical history, social, surgical and family history all reviewed in electronic medical record.  No pertanent information unless stated regarding to the chief complaint.   Review of Systems:Review of systems updated and as accurate as of 01/06/18  No headache, visual changes, nausea, vomiting, diarrhea, constipation, dizziness, abdominal pain, skin rash, fevers, chills, night sweats, weight loss, swollen lymph nodes, body aches, joint swelling, chest pain, shortness of breath, mood changes.  Positive muscle aches  Objective  Blood pressure 108/80, pulse 83, height  (1.88 m), weight 294 lb (133.4 kg), SpO2 98 %. Systems examined below as of 01/06/18   General: No apparent distress alert and oriented x3 mood and affect normal, dressed appropriately.  HEENT: Pupils equal but wears  special glasses secondary to glaucoma, extraocular movements intact  Respiratory: Patient's speak in full sentences and does not appear short of breath  Cardiovascular: No lower extremity edema, non tender, no erythema  Skin: Warm dry intact with no signs of infection or rash on extremities or on axial skeleton.  Abdomen: Soft nontender  Neuro: Cranial nerves II through XII are intact, neurovascularly intact in all extremities with 2+ DTRs and 2+ pulses.  Lymph: No lymphadenopathy of posterior or anterior cervical chain or axillae bilaterally.  Gait normal with good balance and coordination.  MSK:  Non tender with full range of motion and good stability and symmetric strength and tone of shoulders, elbows, wrist, hip, and ankles bilaterally.  Knee: Right Normal to inspection with no erythema  or effusion or obvious bony abnormalities. Tender over the medial joint line. ROM full in flexion and extension and lower leg rotation. Ligaments with solid consistent endpoints including ACL, PCL, LCL, MCL. Positive Mcmurray's, Apley's, and Thessalonian tests. Non painful patellar compression. Patellar glide without crepitus. Patellar and quadriceps tendons unremarkable. Hamstring and quadriceps strength is normal.  Back exam shows some tightness of the lumbosacral area bilaterally.  Positive Faber on the right.  Negative straight leg test.  After informed written and verbal consent, patient was seated on exam table. Right knee was prepped with alcohol swab and utilizing anterolateral approach, patient's right knee space was injected with 4:1  marcaine 0.5%: Kenalog /dL. Patient tolerated the procedure well without immediate complications.  Osteopathic findings  T3 extended rotated and side bent right inhaled third rib T9 extended rotated and side bent left L2 flexed rotated and side bent right Sacrum right on right     Impression and Recommendations:     This case required medical decision making of moderate complexity.      Note: This dictation was prepared with Dragon dictation along with smaller phrase technology. Any transcriptional errors that result from this process are unintentional.

## 2018-01-06 NOTE — Assessment & Plan Note (Signed)
Injected the second 2018.  Tolerated the procedure well.  We discussed icing regimen and home exercises.  Discussed which activities of doing which wants to avoid.  Patient is to increase activity slowly.  Follow-up again with me in 6 weeks.  Could be a candidate for Visco supplementation.

## 2018-01-17 DIAGNOSIS — H401133 Primary open-angle glaucoma, bilateral, severe stage: Secondary | ICD-10-CM | POA: Diagnosis not present

## 2018-02-16 NOTE — Progress Notes (Signed)
[name] [address] [place], [place] [address] Phone: [phone] Subjective:     CC: Back pain  BJY:NWGNFAOZHY  Bryce DARLEY Sr. is a 42 y.o. male coming in with complaint of back pain. No changes since last visit. Notes a tightness in his back. Has responded well to OMT in the past.  Patient has been doing very well with the manipulation overall.  Some tightness.  Leaving town here in the next several weeks.     Past Medical History:  Diagnosis Date  . Allergy   . Cataract   . Fibromyalgia   . Glaucoma    Past Surgical History:  Procedure Laterality Date  . EYE SURGERY    . HERNIA REPAIR     Social History   Socioeconomic History  . Marital status: Married    Spouse name: Not on file  . Number of children: Not on file  . Years of education: Not on file  . Highest education level: Not on file  Occupational History  . Not on file  Social Needs  . Financial resource strain: Not on file  . Food insecurity:    Worry: Not on file    Inability: Not on file  . Transportation needs:    Medical: Not on file    Non-medical: Not on file  Tobacco Use  . Smoking status: Never Smoker  . Smokeless tobacco: Never Used  Substance and Sexual Activity  . Alcohol use: No  . Drug use: No  . Sexual activity: Not on file  Lifestyle  . Physical activity:    Days per week: Not on file    Minutes per session: Not on file  . Stress: Not on file  Relationships  . Social connections:    Talks on phone: Not on file    Gets together: Not on file    Attends religious service: Not on file    Active member of club or organization: Not on file    Attends meetings of clubs or organizations: Not on file    Relationship status: Not on file  Other Topics Concern  . Not on file  Social History Narrative  . Not on file   Allergies  Allergen Reactions  . Citrus Anaphylaxis  . Latex Rash   Family History  Problem Relation Age of Onset  . Diabetes  Mother   . Hypertension Mother   . Hyperlipidemia Father   . Hypertension Father   . Stroke Father   . Hypertension Brother   . Cancer Maternal Grandmother   . Cancer Paternal Grandfather      Past medical history, social, surgical and family history all reviewed in electronic medical record.  No pertanent information unless stated regarding to the chief complaint.   Review of Systems:Review of systems updated and as accurate as of 02/17/18  No headache, visual changes, nausea, vomiting, diarrhea, constipation, dizziness, abdominal pain, skin rash, fevers, chills, night sweats, weight loss, swollen lymph nodes, body aches, joint swelling, muscle aches, chest pain, shortness of breath, mood changes.   Objective  Blood pressure 108/76, pulse 69, height 6\' 2"  (1.88 m), weight 266 lb (120.7 kg), SpO2 95 %. Systems examined below as of 02/17/18   General: No apparent distress alert and oriented x3 mood and affect normal, dressed appropriately.  HEENT: Pupils equal, extraocular movements intact  Respiratory: Patient's speak in full sentences and does not appear short of breath  Cardiovascular: No lower extremity edema, non tender,  no erythema  Skin: Warm dry intact with no signs of infection or rash on extremities or on axial skeleton.  Abdomen: Soft nontender  Neuro: Cranial nerves II through XII are intact, neurovascularly intact in all extremities with 2+ DTRs and 2+ pulses.  Lymph: No lymphadenopathy of posterior or anterior cervical chain or axillae bilaterally.  Gait normal with good balance and coordination.  MSK:  Non tender with full range of motion and good stability and symmetric strength and tone of shoulders, elbows, wrist, hip, knee and ankles bilaterally.   Back Exam:  Inspection: Unremarkable  Motion: Flexion 45 deg, Extension 25 deg, Side Bending to 35 deg bilaterally,  Rotation to 45 deg bilaterally   SLR laying: Negative  XSLR laying: Negative  Palpable tenderness:  Tender to palpation the paraspinal musculature.[name]. FABER: Patient does have significant tightness of Faber bilaterally. Sensory change: Gross sensation intact to all lumbar and sacral dermatomes.  Reflexes: 2+ at both patellar tendons, 2+ at achilles tendons, Babinski's downgoing.  Strength at foot  Plantar-flexion: 5/5 Dorsi-flexion: 5/5 Eversion: 5/5 Inversion: 5/5  Leg strength  Quad: 5/5 Hamstring: 5/5 Hip flexor: 5/5 Hip abductors: 5/5  Gait unremarkable. Patient does have some tenderness more in the thoracolumbar junction today which is new.  Osteopathic findings   T9 extended rotated and side bent left L2 flexed rotated and side bent right Sacrum right on right    Impression and Recommendations:     This case required medical decision making of moderate complexity.      Note: This dictation was prepared with Dragon dictation along with smaller phrase technology. Any transcriptional errors that result from this process are unintentional.

## 2018-02-17 ENCOUNTER — Ambulatory Visit (INDEPENDENT_AMBULATORY_CARE_PROVIDER_SITE_OTHER): Payer: Medicare Other | Admitting: Family Medicine

## 2018-02-17 ENCOUNTER — Encounter: Payer: Self-pay | Admitting: Family Medicine

## 2018-02-17 VITALS — BP 108/76 | HR 69 | Ht 74.0 in | Wt 266.0 lb

## 2018-02-17 DIAGNOSIS — M545 Low back pain: Secondary | ICD-10-CM | POA: Diagnosis not present

## 2018-02-17 DIAGNOSIS — G8929 Other chronic pain: Secondary | ICD-10-CM

## 2018-02-17 DIAGNOSIS — M999 Biomechanical lesion, unspecified: Secondary | ICD-10-CM

## 2018-02-17 NOTE — Assessment & Plan Note (Signed)
Decision today to treat with OMT was based on Physical Exam  After verbal consent patient was treated with HVLA, ME, FPR techniques in  thoracic, lumbar and sacral areas  Patient tolerated the procedure well with improvement in symptoms  Patient given exercises, stretches and lifestyle modifications  See medications in patient instructions if given  Patient will follow up in 4 weeks 

## 2018-02-17 NOTE — Assessment & Plan Note (Signed)
Patient does have back pain.  Does have known mild degenerative disc disease.  We discussed again the importance of core strength.  Patient will continue on the same medications including the Cymbalta at this moment.  Seems to be helping the gabapentin at night.  Patient also has the acute medial meniscal tear as well as patellofemoral arthritis of the knee that sometimes compensate causes some increasing discomfort.  Patient will do good overall and follow-up with me again in 4 to 6 weeks

## 2018-02-17 NOTE — Patient Instructions (Signed)
Good to see you  Ice is your friend Have a great trip  You know the drill  Seem again in 4-6 weeks and we can take care of the knee.

## 2018-03-16 NOTE — Progress Notes (Deleted)
[name] [address] [place], [place] [address] Phone: [phone] Subjective:    I'm seeing this patient by the request  of:    CC:   BJY:NWGNFAOZHY  KEKAI GETER Sr. is a 42 y.o. male coming in with complaint of ***  Onset-  Location Duration-  Character- Aggravating factors- Reliving factors-  Therapies tried-  Severity-     Past Medical History:  Diagnosis Date  . Allergy   . Cataract   . Fibromyalgia   . Glaucoma    Past Surgical History:  Procedure Laterality Date  . EYE SURGERY    . HERNIA REPAIR     Social History   Socioeconomic History  . Marital status: Married    Spouse name: Not on file  . Number of children: Not on file  . Years of education: Not on file  . Highest education level: Not on file  Occupational History  . Not on file  Social Needs  . Financial resource strain: Not on file  . Food insecurity:    Worry: Not on file    Inability: Not on file  . Transportation needs:    Medical: Not on file    Non-medical: Not on file  Tobacco Use  . Smoking status: Never Smoker  . Smokeless tobacco: Never Used  Substance and Sexual Activity  . Alcohol use: No  . Drug use: No  . Sexual activity: Not on file  Lifestyle  . Physical activity:    Days per week: Not on file    Minutes per session: Not on file  . Stress: Not on file  Relationships  . Social connections:    Talks on phone: Not on file    Gets together: Not on file    Attends religious service: Not on file    Active member of club or organization: Not on file    Attends meetings of clubs or organizations: Not on file    Relationship status: Not on file  Other Topics Concern  . Not on file  Social History Narrative  . Not on file   Allergies  Allergen Reactions  . Citrus Anaphylaxis  . Latex Rash   Family History  Problem Relation Age of Onset  . Diabetes Mother   . Hypertension Mother   . Hyperlipidemia Father   . Hypertension  Father   . Stroke Father   . Hypertension Brother   . Cancer Maternal Grandmother   . Cancer Paternal Grandfather      Past medical history, social, surgical and family history all reviewed in electronic medical record.  No pertanent information unless stated regarding to the chief complaint.   Review of Systems:Review of systems updated and as accurate as of 03/16/18  No headache, visual changes, nausea, vomiting, diarrhea, constipation, dizziness, abdominal pain, skin rash, fevers, chills, night sweats, weight loss, swollen lymph nodes, body aches, joint swelling, muscle aches, chest pain, shortness of breath, mood changes.   Objective  There were no vitals taken for this visit. Systems examined below as of 03/16/18   General: No apparent distress alert and oriented x3 mood and affect normal, dressed appropriately.  HEENT: Pupils equal, extraocular movements intact  Respiratory: Patient's speak in full sentences and does not appear short of breath  Cardiovascular: No lower extremity edema, non tender, no erythema  Skin: Warm dry intact with no signs of infection or rash on extremities or on axial skeleton.  Abdomen: Soft nontender  Neuro: Cranial  nerves II through XII are intact, neurovascularly intact in all extremities with 2+ DTRs and 2+ pulses.  Lymph: No lymphadenopathy of posterior or anterior cervical chain or axillae bilaterally.  Gait normal with good balance and coordination.  MSK:  Non tender with full range of motion and good stability and symmetric strength and tone of shoulders, elbows, wrist, hip, knee and ankles bilaterally.     Impression and Recommendations:     This case required medical decision making of moderate complexity.      Note: This dictation was prepared with Dragon dictation along with smaller phrase technology. Any transcriptional errors that result from this process are unintentional.

## 2018-03-17 ENCOUNTER — Ambulatory Visit: Payer: Medicare Other | Admitting: Family Medicine

## 2018-03-20 NOTE — Progress Notes (Signed)
[name] [address] [place], [place] [address] Phone: [phone] Subjective:    I'm seeing this patient by the request  of:    CC: Back pain follow-up  OZH:[id number]  Bryce BELLARD Sr. is a 42 y.o. male coming in with complaint of back pain. Patient states that he has been having similar pain since last visit. No better no worse.  Patient is still having tightness.  No radiation down the leg.  Will try to be active but finds it difficult from time to time secondary to the discomfort and pain.      Past Medical History:  Diagnosis Date  . Allergy   . Cataract   . Fibromyalgia   . Glaucoma    Past Surgical History:  Procedure Laterality Date  . EYE SURGERY    . HERNIA REPAIR     Social History   Socioeconomic History  . Marital status: Married    Spouse name: Not on file  . Number of children: Not on file  . Years of education: Not on file  . Highest education level: Not on file  Occupational History  . Not on file  Social Needs  . Financial resource strain: Not on file  . Food insecurity:    Worry: Not on file    Inability: Not on file  . Transportation needs:    Medical: Not on file    Non-medical: Not on file  Tobacco Use  . Smoking status: Never Smoker  . Smokeless tobacco: Never Used  Substance and Sexual Activity  . Alcohol use: No  . Drug use: No  . Sexual activity: Not on file  Lifestyle  . Physical activity:    Days per week: Not on file    Minutes per session: Not on file  . Stress: Not on file  Relationships  . Social connections:    Talks on phone: Not on file    Gets together: Not on file    Attends religious service: Not on file    Active member of club or organization: Not on file    Attends meetings of clubs or organizations: Not on file    Relationship status: Not on file  Other Topics Concern  . Not on file  Social History Narrative  . Not on file   Allergies  Allergen Reactions  . Citrus  Anaphylaxis  . Latex Rash   Family History  Problem Relation Age of Onset  . Diabetes Mother   . Hypertension Mother   . Hyperlipidemia Father   . Hypertension Father   . Stroke Father   . Hypertension Brother   . Cancer Maternal Grandmother   . Cancer Paternal Grandfather      Past medical history, social, surgical and family history all reviewed in electronic medical record.  No pertanent information unless stated regarding to the chief complaint.   Review of Systems:Review of systems updated and as accurate as of 03/21/18  No headache, visual changes, nausea, vomiting, diarrhea, constipation, dizziness, abdominal pain, skin rash, fevers, chills, night sweats, weight loss, swollen lymph nodes, body aches, joint swelling, chest pain, shortness of breath, mood changes.  Positive muscle aches  Objective  Blood pressure 110/78, pulse 65, height 6\' 2"  (1.88 m), weight 265 lb (120.2 kg), SpO2 95 %. Systems examined below as of 03/21/18   General: No apparent distress alert and oriented x3 mood and affect normal, dressed appropriately.  HEENT: Pupils equal, extraocular movements intact  Respiratory:  Patient's speak in full sentences and does not appear short of breath  Cardiovascular: No lower extremity edema, non tender, no erythema  Skin: Warm dry intact with no signs of infection or rash on extremities or on axial skeleton.  Abdomen: Soft nontender  Neuro: Cranial nerves II through XII are intact, neurovascularly intact in all extremities with 2+ DTRs and 2+ pulses.  Lymph: No lymphadenopathy of posterior or anterior cervical chain or axillae bilaterally.  Gait normal with good balance and coordination.  MSK:  Non tender with full range of motion and good stability and symmetric strength and tone of shoulders, elbows, wrist, hip, knee and ankles bilaterally.   Back Exam:  Inspection: Mild loss of lordosis Motion: Flexion 35 deg, Extension 15 deg, Side Bending to 25 deg bilaterally,   Rotation to 35 deg bilaterally  SLR laying: Negative  XSLR laying: Negative  Palpable tenderness: Tender to palpation mostly in the thoracolumbar and lumbosacral areas bilaterally.[name]. FABER: negative. Sensory change: Gross sensation intact to all lumbar and sacral dermatomes.  Reflexes: 2+ at both patellar tendons, 2+ at achilles tendons, Babinski's downgoing.  Strength at foot  Plantar-flexion: 5/5 Dorsi-flexion: 5/5 Eversion: 5/5 Inversion: 5/5  Leg strength  Quad: 5/5 Hamstring: 5/5 Hip flexor: 5/5 Hip abductors: 5/5  Gait unremarkable.  Osteopathic findings   T6 extended rotated and side bent left L4 flexed rotated and side bent right Sacrum right on right     Impression and Recommendations:     This case required medical decision making of moderate complexity.      Note: This dictation was prepared with Dragon dictation along with smaller phrase technology. Any transcriptional errors that result from this process are unintentional.

## 2018-03-21 ENCOUNTER — Encounter: Payer: Self-pay | Admitting: Family Medicine

## 2018-03-21 ENCOUNTER — Ambulatory Visit (INDEPENDENT_AMBULATORY_CARE_PROVIDER_SITE_OTHER): Payer: Medicare Other | Admitting: Family Medicine

## 2018-03-21 VITALS — BP 110/78 | HR 65 | Ht 74.0 in | Wt 265.0 lb

## 2018-03-21 DIAGNOSIS — M545 Low back pain: Secondary | ICD-10-CM | POA: Diagnosis not present

## 2018-03-21 DIAGNOSIS — M999 Biomechanical lesion, unspecified: Secondary | ICD-10-CM | POA: Diagnosis not present

## 2018-03-21 DIAGNOSIS — G8929 Other chronic pain: Secondary | ICD-10-CM

## 2018-03-21 MED ORDER — TIZANIDINE HCL 2 MG PO CAPS: 2.0000 mg | ORAL_CAPSULE | Freq: Three times a day (TID) | ORAL | 0 refills | Status: DC | PRN

## 2018-03-21 NOTE — Assessment & Plan Note (Signed)
Still multifactorial.  Discussed icing regimen and home exercise.  Discussed which activities of doing which was to avoid.  Patient does respond to manipulation.  Does not want to go back to physical therapy at the moment.  No significant radicular symptoms.  Follow-up again 4 to 8 weeks

## 2018-03-21 NOTE — Patient Instructions (Signed)
Good to see you  Gustavus Bryantce is your friend.  Keep doing the exercise Stay active Zanaflex up to 3 times a day  See me again in 4 weeks

## 2018-03-21 NOTE — Assessment & Plan Note (Signed)
Decision today to treat with OMT was based on Physical Exam  After verbal consent patient was treated with HVLA, ME, FPR techniques in  thoracic, lumbar and sacral areas  Patient tolerated the procedure well with improvement in symptoms  Patient given exercises, stretches and lifestyle modifications  See medications in patient instructions if given  Patient will follow up in 4-6 weeks 

## 2018-04-21 NOTE — Progress Notes (Signed)
[facility]ry[facility] Scale[facility]ach Smith D.O[facility] [address] [place], [place] [address] Phone: [phone] Subjective:      CC: Back pain  FAO:[id number]:Subjective  Bryce Pillarlvin L Martensen Sr. is a [age] y.o. male coming in with complaint of back pain. States he has his good and bad days. Doesn't feel any better than the last visit.  Patient has had significant increased stress recently with the passing of his father.  Finding difficult to find time for himself.     Past Medical History:  Diagnosis Date  . Allergy   . Cataract   . Fibromyalgia   . Glaucoma    Past Surgical History:  Procedure Laterality Date  . EYE SURGERY    . HERNIA REPAIR     Social History   Socioeconomic History  . Marital status: Married    Spouse name: Not on file  . Number of children: Not on file  . Years of education: Not on file  . Highest education level: Not on file  Occupational History  . Not on file  Social Needs  . Financial resource strain: Not on file  . Food insecurity:    Worry: Not on file    Inability: Not on file  . Transportation needs:    Medical: Not on file    Non-medical: Not on file  Tobacco Use  . Smoking status: Never Smoker  . Smokeless tobacco: Never Used  Substance and Sexual Activity  . Alcohol use: No  . Drug use: No  . Sexual activity: Not on file  Lifestyle  . Physical activity:    Days per week: Not on file    Minutes per session: Not on file  . Stress: Not on file  Relationships  . Social connections:    Talks on phone: Not on file    Gets together: Not on file    Attends religious service: Not on file    Active member of club or organization: Not on file    Attends meetings of clubs or organizations: Not on file    Relationship status: Not on file  Other Topics Concern  . Not on file  Social History Narrative  . Not on file   Allergies  Allergen Reactions  . Citrus Anaphylaxis  . Latex Rash   Family History  Problem Relation Age of Onset  . Diabetes Mother     . Hypertension Mother   . Hyperlipidemia Father   . Hypertension Father   . Stroke Father   . Hypertension Brother   . Cancer Maternal Grandmother   . Cancer Paternal Grandfather      Past medical history, social, surgical and family history all reviewed in electronic medical record.  No pertanent information unless stated regarding to the chief complaint.   Review of Systems:Review of systems updated and as accurate as of 04/22/18  No headache, visual changes, nausea, vomiting, diarrhea, constipation, dizziness, abdominal pain, skin rash, fevers, chills, night sweats, weight loss, swollen lymph nodes, body aches, joint swelling, , chest pain, shortness of breath, mood changes.  Positive muscle aches  Objective  Blood pressure 104/78, pulse 73, height 6\' 2"  (1.88 m), weight 257 lb (116.6 kg), SpO2 96 %. Systems examined below as of 04/22/18   General: No apparent distress alert and oriented x3 mood and affect normal, dressed appropriately.  HEENT: Pupils equal, extraocular movements intact  Respiratory: Patient's speak in full sentences and does not appear short of breath  Cardiovascular: No lower extremity edema, non tender,  no erythema  Skin: Warm dry intact with no signs of infection or rash on extremities or on axial skeleton.  Abdomen: Soft nontender  Neuro: Cranial nerves II through XII are intact, neurovascularly intact in all extremities with 2+ DTRs and 2+ pulses.  Lymph: No lymphadenopathy of posterior or anterior cervical chain or axillae bilaterally.  Gait normal with good balance and coordination.  MSK:  Non tender with full range of motion and good stability and symmetric strength and tone of shoulders, elbows, wrist, hip, knee and ankles bilaterally.  Back Exam:  Inspection: Loss of lordosis Motion: Flexion 35 deg, Extension 20 deg, Side Bending to 35 deg bilaterally,  Rotation to 30 deg bilaterally  SLR laying: Negative  XSLR laying: Negative  Palpable tenderness:  Tender to palpation in the paraspinal musculature.  Diffusely with significant tightness in the thoracolumbar and sacroiliac joint. FABER: negative. Sensory change: Gross sensation intact to all lumbar and sacral dermatomes.  Reflexes: 2+ at both patellar tendons, 2+ at achilles tendons, Babinski's downgoing.  Strength at foot  Plantar-flexion: 5/5 Dorsi-flexion: 5/5 Eversion: 5/5 Inversion: 5/5  Leg strength  Quad: 5/5 Hamstring: 5/5 Hip flexor: 5/5 Hip abductors: 4/5 but symmetric Gait unremarkable.  Osteopathic findings  T9 extended rotated and side bent left L2 flexed rotated and side bent right Sacrum left on left     Impression and Recommendations:     This case required medical decision making of moderate complexity.      Note: This dictation was prepared with Dragon dictation along with smaller phrase technology. Any transcriptional errors that result from this process are unintentional.

## 2018-04-22 ENCOUNTER — Encounter: Payer: Self-pay | Admitting: Family Medicine

## 2018-04-22 ENCOUNTER — Ambulatory Visit (INDEPENDENT_AMBULATORY_CARE_PROVIDER_SITE_OTHER): Payer: Medicare Other | Admitting: Family Medicine

## 2018-04-22 VITALS — BP 104/78 | HR 73 | Ht 74.0 in | Wt 257.0 lb

## 2018-04-22 DIAGNOSIS — G8929 Other chronic pain: Secondary | ICD-10-CM | POA: Diagnosis not present

## 2018-04-22 DIAGNOSIS — M545 Low back pain: Secondary | ICD-10-CM | POA: Diagnosis not present

## 2018-04-22 DIAGNOSIS — M999 Biomechanical lesion, unspecified: Secondary | ICD-10-CM | POA: Diagnosis not present

## 2018-04-22 NOTE — Patient Instructions (Signed)
3 weeks I am here if you need me

## 2018-04-22 NOTE — Assessment & Plan Note (Signed)
Continues to have significant back pain.  Discussed posture and ergonomics.  Discussed which activities to doing which wants to avoid.  Patient has had increasing stress and likely contributing to some of the discomfort and pain.  Follow-up again in 4 to 8 weeks

## 2018-04-22 NOTE — Assessment & Plan Note (Signed)
Decision today to treat with OMT was based on Physical Exam  After verbal consent patient was treated with HVLA, ME, FPR techniques in  thoracic, lumbar and sacral areas  Patient tolerated the procedure well with improvement in symptoms  Patient given exercises, stretches and lifestyle modifications  See medications in patient instructions if given  Patient will follow up in 4-8 weeks 

## 2018-05-19 ENCOUNTER — Encounter: Payer: Self-pay | Admitting: Family Medicine

## 2018-05-19 ENCOUNTER — Ambulatory Visit: Payer: Self-pay

## 2018-05-19 ENCOUNTER — Ambulatory Visit (INDEPENDENT_AMBULATORY_CARE_PROVIDER_SITE_OTHER): Payer: Medicare Other | Admitting: Family Medicine

## 2018-05-19 VITALS — BP 110/78 | HR 70 | Ht 74.0 in | Wt 257.0 lb

## 2018-05-19 DIAGNOSIS — S63651A Sprain of metacarpophalangeal joint of left index finger, initial encounter: Secondary | ICD-10-CM

## 2018-05-19 DIAGNOSIS — G8929 Other chronic pain: Secondary | ICD-10-CM | POA: Diagnosis not present

## 2018-05-19 DIAGNOSIS — S63619A Unspecified sprain of unspecified finger, initial encounter: Secondary | ICD-10-CM | POA: Insufficient documentation

## 2018-05-19 DIAGNOSIS — M79642 Pain in left hand: Secondary | ICD-10-CM

## 2018-05-19 DIAGNOSIS — M545 Low back pain, unspecified: Secondary | ICD-10-CM

## 2018-05-19 DIAGNOSIS — M999 Biomechanical lesion, unspecified: Secondary | ICD-10-CM | POA: Diagnosis not present

## 2018-05-19 NOTE — Assessment & Plan Note (Signed)
Decision today to treat with OMT was based on Physical Exam  After verbal consent patient was treated with HVLA, ME, FPR techniques in  thoracic, lumbar and sacral areas  Patient tolerated the procedure well with improvement in symptoms  Patient given exercises, stretches and lifestyle modifications  See medications in patient instructions if given  Patient will follow up in 4 weeks 

## 2018-05-19 NOTE — Assessment & Plan Note (Signed)
Appears to have a sprain.  Does have a calcific foreign body seems to be intra-articular.  Discussed we will monitor at this time.  Buddy taping, icing regimen.  Patient does have full range of motion and good grip strength.  Do not feel any other imaging would change medical management at this time and will follow-up in 4 weeks

## 2018-05-19 NOTE — Assessment & Plan Note (Signed)
Worsening tightness likely secondary to more of the stress.  Continue the Cymbalta at a low dose.  Has gabapentin as well.  We discussed icing regimen and home exercise.  Avoid oral anti-inflammatory secondary to patient's other comorbidities.  Follow-up again in 4 to 6 weeks

## 2018-05-19 NOTE — Patient Instructions (Signed)
Good to see you  Ice is yoru friend Stay active Wrap fingers together for 1 week or so  See me again in 5-6 weeks

## 2018-05-19 NOTE — Progress Notes (Signed)
[facility] D.O. [facility] [address] [place], [place] [address] Phone: [phone] Subjective:     CC: Back pain follow-up, new hand pain  OZH:YQMVHQIONGHPI:Subjective  Bryce Pillarlvin L Lowden Sr. is a 42 y.o. male coming in with complaint of back pain. He has been doing well since last visit.  Patient has responded fairly well to manipulation.  Does have history of fibromyalgia.  Some mild arthritic changes noted.  Has been dealing with a lot of stress recently.  Did have the loss of his father within the last 6 weeks.  Seems to be doing well but does have a lot of coordination to be doing with the aftermath.  Patient has been having pain between the 2nd and 3rd fingers of the left hand. Patient states that it feels like he needs to pop the knuckle. He has been lifting jug of lemonade that sit in a crate at the farmers market which may have contributed. Constant pain.       Past Medical History:  Diagnosis Date  . Allergy   . Cataract   . Fibromyalgia   . Glaucoma    Past Surgical History:  Procedure Laterality Date  . EYE SURGERY    . HERNIA REPAIR     Social History   Socioeconomic History  . Marital status: Married    Spouse name: Not on file  . Number of children: Not on file  . Years of education: Not on file  . Highest education level: Not on file  Occupational History  . Not on file  Social Needs  . Financial resource strain: Not on file  . Food insecurity:    Worry: Not on file    Inability: Not on file  . Transportation needs:    Medical: Not on file    Non-medical: Not on file  Tobacco Use  . Smoking status: Never Smoker  . Smokeless tobacco: Never Used  Substance and Sexual Activity  . Alcohol use: No  . Drug use: No  . Sexual activity: Not on file  Lifestyle  . Physical activity:    Days per week: Not on file    Minutes per session: Not on file  . Stress: Not on file  Relationships  . Social connections:    Talks on phone: Not on file    Gets  together: Not on file    Attends religious service: Not on file    Active member of club or organization: Not on file    Attends meetings of clubs or organizations: Not on file    Relationship status: Not on file  Other Topics Concern  . Not on file  Social History Narrative  . Not on file   Allergies  Allergen Reactions  . Citrus Anaphylaxis  . Latex Rash   Family History  Problem Relation Age of Onset  . Diabetes Mother   . Hypertension Mother   . Hyperlipidemia Father   . Hypertension Father   . Stroke Father   . Hypertension Brother   . Cancer Maternal Grandmother   . Cancer Paternal Grandfather          Current Outpatient Medications (Other):  .  amoxicillin (AMOXIL) 500 MG capsule, Take 1 capsule (500 mg total) 2 (two) times daily by mouth. [name].  azithromycin (ZITHROMAX) 500 MG tablet, Take 1 tablet (500 mg total) by mouth daily. .  brimonidine (ALPHAGAN) 0.2 % ophthalmic solution, Place 1 drop into both eyes 2 (two) times daily.  .[name]  dorzolamide-timolol (COSOPT) 22.3-6.8 MG/ML ophthalmic solution, Place 1 drop into both eyes 2 (two) times daily. .  DULoxetine (CYMBALTA) 20 MG capsule, Take 2 capsules (40 mg total) by mouth daily. [name]  gabapentin (NEURONTIN) 300 MG capsule, Take 1 capsule (300 mg total) by mouth 3 (three) times daily. .  tizanidine (ZANAFLEX) 2 MG capsule, Take 1 capsule (2 mg total) by mouth 3 (three) times daily as needed for muscle spasms. .  travoprost, benzalkonium, (TRAVATAN) 0.004 % ophthalmic solution, Place 1 drop into both eyes at bedtime.    Past medical history, social, surgical and family history all reviewed in electronic medical record.  No pertanent information unless stated regarding to the chief complaint.   Review of Systems:  No headache, visual changes, nausea, vomiting, diarrhea, constipation, dizziness, abdominal pain, skin rash, fevers, chills, night sweats, weight loss, swollen lymph nodes, body aches, joint swelling, muscle  aches, chest pain, shortness of breath, mood changes.  Positive muscle aches  Objective  Blood pressure 110/78, pulse 70, height 6\' 2"  (1.88 m), weight 257 lb (116.6 kg), SpO2 97 %.    General: No apparent distress alert and oriented x3 mood and affect normal, dressed appropriately.  HEENT: Pupils equal, extraocular movements intact  Respiratory: Patient's speak in full sentences and does not appear short of breath  Cardiovascular: No lower extremity edema, non tender, no erythema  Skin: Warm dry intact with no signs of infection or rash on extremities or on axial skeleton.  Abdomen: Soft nontender  Neuro: Cranial nerves II through XII are intact, neurovascularly intact in all extremities with 2+ DTRs and 2+ pulses.  Lymph: No lymphadenopathy of posterior or anterior cervical chain or axillae bilaterally.  Gait normal with good balance and coordination.  MSK:  Non tender with full range of motion and good stability and symmetric strength and tone of shoulders, elbows, wrist, hip, knee and ankles bilaterally.  Left hand exam shows the patient does have some tenderness to palpation between the second and third fingers.  Full range of motion.  No swelling or any synovitis noted.  No angulation of the fingers noted.  Good capillary refill  Back exam does show loss of lordosis.  Nontender to palpation of the thoracolumbar and lumbosacral juncture paraspinal musculature.  Mild tightness with Pearlean Brownie test.  Neck exam also shows some loss of lordosis.  Tightness noted in all planes.  Negative Spurling's.  Osteopathic findings  T9 extended rotated and side bent left L2 flexed rotated and side bent right Sacrum right on right  Limited musculoskeletal ultrasound was performed and interpreted by [name] Limited ultrasound of patient's metacarpal phalangeal joints show the patient does have a small piece of calcium intra-articular of the second MCP.  No significant effusion or synovitis noted.   Ligaments appear to be intact.   Impression and Recommendations:     This case required medical decision making of moderate complexity. The above documentation has been reviewed and is accurate and complete [name], DO       Note: This dictation was prepared with Dragon dictation along with smaller phrase technology. Any transcriptional errors that result from this process are unintentional.

## 2018-05-26 ENCOUNTER — Ambulatory Visit: Payer: Medicare Other | Admitting: Family Medicine

## 2018-06-21 NOTE — Progress Notes (Signed)
[name] [address] [place], [place] [address] Phone: [phone] Subjective:   Bryce [name], am serving as a scribe for Dr. Antoine Primas.  I'm seeing this patient by the request  of:    CC: Left hand pain, back pain follow-up  [id number]  Bryce Capers Snooks Sr. is a 42 y.o. male coming in with complaint of left hand pain between the 2nd and 3rd finger. Pain has not improved. Did try to buddy tape but that did not help.  Patient was found to have some calcific changes previously.  No true fracture.  Has not responded well to conservative therapy  Is having back tightness today. Denies any radiating symptoms.  More tightness.  Still seems to be the right side.  Seems to be more sacroiliac previously.  Nothing has changed of sometimes having muscle spasms that seem to come out of the blue.       Past Medical History:  Diagnosis Date  . Allergy   . Cataract   . Fibromyalgia   . Glaucoma    Past Surgical History:  Procedure Laterality Date  . EYE SURGERY    . HERNIA REPAIR     Social History   Socioeconomic History  . Marital status: Married    Spouse name: Not on file  . Number of children: Not on file  . Years of education: Not on file  . Highest education level: Not on file  Occupational History  . Not on file  Social Needs  . Financial resource strain: Not on file  . Food insecurity:    Worry: Not on file    Inability: Not on file  . Transportation needs:    Medical: Not on file    Non-medical: Not on file  Tobacco Use  . Smoking status: Never Smoker  . Smokeless tobacco: Never Used  Substance and Sexual Activity  . Alcohol use: No  . Drug use: No  . Sexual activity: Not on file  Lifestyle  . Physical activity:    Days per week: Not on file    Minutes per session: Not on file  . Stress: Not on file  Relationships  . Social connections:    Talks on phone: Not on file    Gets together: Not on file    Attends  religious service: Not on file    Active member of club or organization: Not on file    Attends meetings of clubs or organizations: Not on file    Relationship status: Not on file  Other Topics Concern  . Not on file  Social History Narrative  . Not on file   Allergies  Allergen Reactions  . Citrus Anaphylaxis  . Latex Rash   Family History  Problem Relation Age of Onset  . Diabetes Mother   . Hypertension Mother   . Hyperlipidemia Father   . Hypertension Father   . Stroke Father   . Hypertension Brother   . Cancer Maternal Grandmother   . Cancer Paternal Grandfather          Current Outpatient Medications (Other):  .  amoxicillin (AMOXIL) 500 MG capsule, Take 1 capsule (500 mg total) 2 (two) times daily by mouth. [name]  azithromycin (ZITHROMAX) 500 MG tablet, Take 1 tablet (500 mg total) by mouth daily. .  brimonidine (ALPHAGAN) 0.2 % ophthalmic solution, Place 1 drop into both eyes 2 (two) times daily.  .  dorzolamide-timolol (COSOPT) 22.3-6.8 MG/ML ophthalmic solution, Place 1  drop into both eyes 2 (two) times daily. .  DULoxetine (CYMBALTA) 20 MG capsule, Take 2 capsules (40 mg total) by mouth daily. [name]  gabapentin (NEURONTIN) 300 MG capsule, Take 1 capsule (300 mg total) by mouth 3 (three) times daily. .  tizanidine (ZANAFLEX) 2 MG capsule, Take 1 capsule (2 mg total) by mouth 3 (three) times daily as needed for muscle spasms. .  travoprost, benzalkonium, (TRAVATAN) 0.004 % ophthalmic solution, Place 1 drop into both eyes at bedtime.    Past medical history, social, surgical and family history all reviewed in electronic medical record.  No pertanent information unless stated regarding to the chief complaint.   Review of Systems:  No headache, visual changes, nausea, vomiting, diarrhea, constipation, dizziness, abdominal pain, skin rash, fevers, chills, night sweats, weight loss, swollen lymph nodes, body aches, joint swelling, chest pain, shortness of breath, mood  changes.  Positive muscle aches  Objective  There were no vitals taken for this visit. Systems examined below as of    General: No apparent distress alert and oriented x3 mood and affect normal, dressed appropriately.  HEENT: Pupils equal, extraocular movements intact  Respiratory: Patient's speak in full sentences and does not appear short of breath  Cardiovascular: No lower extremity edema, non tender, no erythema  Skin: Warm dry intact with no signs of infection or rash on extremities or on axial skeleton.  Abdomen: Soft nontender  Neuro: Cranial nerves II through XII are intact, neurovascularly intact in all extremities with 2+ DTRs and 2+ pulses.  Lymph: No lymphadenopathy of posterior or anterior cervical chain or axillae bilaterally.  Gait normal with good balance and coordination.  MSK:  Non tender with full range of motion and good stability and symmetric strength and tone of shoulders, elbows, wrist, hip, knee and ankles bilaterally.  Mild arthritic changes of multiple joints  Hand exam shows the patient still is tender between the index and middle finger on the left hand.  No swelling no noted.  Does have full range of motion with no angulation.  Back exam shows loss of lordosis.  Tightness with Pearlean Brownie on the right.  Tenderness to palpation the paraspinal musculature on the right side.  Lacks last 10 degrees of extension.  Neurovascular intact distally with 5 out of 5 strength.  Limited musculoskeletal ultrasound was performed and interpreted by [name]  Limited ultrasound showed patient still has some calcific changes in the second MCP joint of the left hand.  3 of them to be examined.  Mild synovitis noted.  Osteopathic findings Cervical C2 flexed rotated and side bent right T5 extended rotated and side bent right inhaled rib T8 extended rotated and side bent left L3 flexed rotated and side bent right Sacrum right on right     Impression and Recommendations:       This case required medical decision making of moderate complexity. The above documentation has been reviewed and is accurate and complete [name], DO       Note: This dictation was prepared with Dragon dictation along with smaller phrase technology. Any transcriptional errors that result from this process are unintentional.

## 2018-06-23 ENCOUNTER — Ambulatory Visit (INDEPENDENT_AMBULATORY_CARE_PROVIDER_SITE_OTHER)
Admission: RE | Admit: 2018-06-23 | Discharge: 2018-06-23 | Disposition: A | Discharge status: Home / Self Care | Payer: Medicare Other | Source: Ambulatory Visit | Attending: Family Medicine | Admitting: Family Medicine

## 2018-06-23 ENCOUNTER — Ambulatory Visit (INDEPENDENT_AMBULATORY_CARE_PROVIDER_SITE_OTHER): Payer: Medicare Other | Admitting: Family Medicine

## 2018-06-23 ENCOUNTER — Ambulatory Visit: Payer: Self-pay

## 2018-06-23 ENCOUNTER — Encounter: Payer: Self-pay | Admitting: Family Medicine

## 2018-06-23 VITALS — BP 108/80 | HR 74 | Ht 74.0 in | Wt 264.0 lb

## 2018-06-23 DIAGNOSIS — M79642 Pain in left hand: Secondary | ICD-10-CM

## 2018-06-23 DIAGNOSIS — S63651D Sprain of metacarpophalangeal joint of left index finger, subsequent encounter: Secondary | ICD-10-CM

## 2018-06-23 DIAGNOSIS — G8929 Other chronic pain: Secondary | ICD-10-CM | POA: Diagnosis not present

## 2018-06-23 DIAGNOSIS — M999 Biomechanical lesion, unspecified: Secondary | ICD-10-CM | POA: Diagnosis not present

## 2018-06-23 DIAGNOSIS — M545 Low back pain, unspecified: Secondary | ICD-10-CM

## 2018-06-23 MED ORDER — VITAMIN D (ERGOCALCIFEROL) 1.25 MG (50000 UNIT) PO CAPS: 50000.0000 [IU] | ORAL_CAPSULE | ORAL | 0 refills | Status: DC

## 2018-06-23 NOTE — Assessment & Plan Note (Signed)
Stable.  Multifactorial.  Discussed icing regimen and home exercise.  Discussed which activities of doing which wants to avoid.  Follow-up again in 4 to 8 weeks

## 2018-06-23 NOTE — Assessment & Plan Note (Signed)
Decision today to treat with OMT was based on Physical Exam  After verbal consent patient was treated with HVLA, ME, FPR techniques in cervical, thoracic, lumbar and sacral areas  Patient tolerated the procedure well with improvement in symptoms  Patient given exercises, stretches and lifestyle modifications  See medications in patient instructions if given  Patient will follow up in 4-8 weeks 

## 2018-06-23 NOTE — Assessment & Plan Note (Signed)
With some mild calcific changes.  If worsening symptoms consider injection.  Patient declined that today.  We discussed x-rays.  Patient was continue to monitor.  Started once weekly vitamin D.

## 2018-06-23 NOTE — Patient Instructions (Addendum)
Good to see you  We will get xray today  Once weekly vitamin D for 12 weeks could help  Stay active Keep working on the core  See me again in 4-6 weeks

## 2018-07-01 ENCOUNTER — Ambulatory Visit: Payer: Medicare Other

## 2018-07-25 NOTE — Progress Notes (Signed)
[name] [address] [place], [place] [address] Phone: [phone] Subjective:   [name], am serving as a scribe for Dr. Antoine Primas.   CC: Back pain and left hand pain  [id number]  Bryce L Willeford Sr. is a 42 y.o. male coming in with complaint of back pain and left hand pain. Continued back pain. Here for OMT.   His hand continues to bother him. He is unable to make a fist with left hand. Discontinued Vitamin D due to bone pain.       Past Medical History:  Diagnosis Date  . Allergy   . Cataract   . Fibromyalgia   . Glaucoma    Past Surgical History:  Procedure Laterality Date  . EYE SURGERY    . HERNIA REPAIR     Social History   Socioeconomic History  . Marital status: Married    Spouse name: Not on file  . Number of children: Not on file  . Years of education: Not on file  . Highest education level: Not on file  Occupational History  . Not on file  Social Needs  . Financial resource strain: Not on file  . Food insecurity:    Worry: Not on file    Inability: Not on file  . Transportation needs:    Medical: Not on file    Non-medical: Not on file  Tobacco Use  . Smoking status: Never Smoker  . Smokeless tobacco: Never Used  Substance and Sexual Activity  . Alcohol use: No  . Drug use: No  . Sexual activity: Not on file  Lifestyle  . Physical activity:    Days per week: Not on file    Minutes per session: Not on file  . Stress: Not on file  Relationships  . Social connections:    Talks on phone: Not on file    Gets together: Not on file    Attends religious service: Not on file    Active member of club or organization: Not on file    Attends meetings of clubs or organizations: Not on file    Relationship status: Not on file  Other Topics Concern  . Not on file  Social History Narrative  . Not on file   Allergies  Allergen Reactions  . Citrus Anaphylaxis  . Latex Rash   Family History  Problem  Relation Age of Onset  . Diabetes Mother   . Hypertension Mother   . Hyperlipidemia Father   . Hypertension Father   . Stroke Father   . Hypertension Brother   . Cancer Maternal Grandmother   . Cancer Paternal Grandfather          Current Outpatient Medications (Other):  .  amoxicillin (AMOXIL) 500 MG capsule, Take 1 capsule (500 mg total) 2 (two) times daily by mouth. [name]  azithromycin (ZITHROMAX) 500 MG tablet, Take 1 tablet (500 mg total) by mouth daily. .  brimonidine (ALPHAGAN) 0.2 % ophthalmic solution, Place 1 drop into both eyes 2 (two) times daily.  .  dorzolamide-timolol (COSOPT) 22.3-6.8 MG/ML ophthalmic solution, Place 1 drop into both eyes 2 (two) times daily. .  DULoxetine (CYMBALTA) 20 MG capsule, Take 2 capsules (40 mg total) by mouth daily. [name]  gabapentin (NEURONTIN) 300 MG capsule, Take 1 capsule (300 mg total) by mouth 3 (three) times daily. .  tizanidine (ZANAFLEX) 2 MG capsule, Take 1 capsule (2 mg total) by mouth 3 (three) times daily as needed  for muscle spasms. .  travoprost, benzalkonium, (TRAVATAN) 0.004 % ophthalmic solution, Place 1 drop into both eyes at bedtime. .  Vitamin D, Ergocalciferol, (DRISDOL) 50000 units CAPS capsule, Take 1 capsule (50,000 Units total) by mouth every 7 (seven) days.    Past medical history, social, surgical and family history all reviewed in electronic medical record.  No pertanent information unless stated regarding to the chief complaint.   Review of Systems:  No headache, visual changes, nausea, vomiting, diarrhea, constipation, dizziness, abdominal pain, skin rash, fevers, chills, night sweats, weight loss, swollen lymph nodes, body aches, joint swelling,, chest pain, shortness of breath, mood changes.  Positive muscle aches  Objective  Blood pressure 118/86, pulse 73, height 6\' 2"  (1.88 m), weight 264 lb (119.7 kg), SpO2 97 %.    General: No apparent distress alert and oriented x3 mood and affect normal, dressed  appropriately.  HEENT: Pupils equal, extraocular movements intact  Respiratory: Patient's speak in full sentences and does not appear short of breath  Cardiovascular: No lower extremity edema, non tender, no erythema  Skin: Warm dry intact with no signs of infection or rash on extremities or on axial skeleton.  Abdomen: Soft nontender  Neuro: Cranial nerves II through XII are intact, neurovascularly intact in all extremities with 2+ DTRs and 2+ pulses.  Lymph: No lymphadenopathy of posterior or anterior cervical chain or axillae bilaterally.  Gait normal with good balance and coordination.  MSK:  Non tender with full range of motion and good stability and symmetric strength and tone of shoulders, elbows, wrist, hip, knee and ankles bilaterally.  Left hand over the first MCP patient's proximally does have what appears to be more of a mild mass on the palmar aspect.  Different positioning than where patient has had pain previously.  Exam mild loss of lordosis.  Patient does have tenderness to palpation more in the left side of the parascapular region.  Low back does have some tenderness as well around the sacral iliac joint bilaterally.  Positive Faber bilaterally.  Negative straight leg test.  Neurovascular intact distally.    Procedure: Real-time Ultrasound Guided Injection of finger cyst Device: GE Logiq Q7 Ultrasound guided injection is preferred based studies that show increased duration, increased effect, greater accuracy, decreased procedural pain, increased response rate, and decreased cost with ultrasound guided versus blind injection.  Verbal informed consent obtained.  Time-out conducted.  Noted no overlying erythema, induration, or other signs of local infection.  Skin prepped in a sterile fashion.  Local anesthesia: Topical Ethyl chloride.  With sterile technique and under real time ultrasound guidance: With a 25-gauge half inch needle injected with 0.5 cc of 0.5% Marcaine and 0.5  cc of Kenalog 40 mg/mL into the MCP joint palmar aspect Completed without difficulty  Pain immediately resolved suggesting accurate placement of the medication.  Advised to call if fevers/chills, erythema, induration, drainage, or persistent bleeding.  Images permanently stored and available for review in the ultrasound unit.  Impression: Technically successful ultrasound guided injection.   Impression and Recommendations:     This case required medical decision making of moderate complexity. The above documentation has been reviewed and is accurate and complete Judi SaaZachary M Smith, DO       Note: This dictation was prepared with Dragon dictation along with smaller phrase technology. Any transcriptional errors that result from this process are unintentional.

## 2018-07-26 ENCOUNTER — Ambulatory Visit (INDEPENDENT_AMBULATORY_CARE_PROVIDER_SITE_OTHER): Payer: Medicare Other | Admitting: Family Medicine

## 2018-07-26 ENCOUNTER — Encounter: Payer: Self-pay | Admitting: Family Medicine

## 2018-07-26 ENCOUNTER — Ambulatory Visit: Payer: Self-pay

## 2018-07-26 VITALS — BP 118/86 | HR 73 | Ht 74.0 in | Wt 264.0 lb

## 2018-07-26 DIAGNOSIS — M79642 Pain in left hand: Secondary | ICD-10-CM

## 2018-07-26 DIAGNOSIS — M67442 Ganglion, left hand: Secondary | ICD-10-CM

## 2018-07-26 DIAGNOSIS — G8929 Other chronic pain: Secondary | ICD-10-CM | POA: Diagnosis not present

## 2018-07-26 DIAGNOSIS — M545 Low back pain: Secondary | ICD-10-CM

## 2018-07-26 DIAGNOSIS — M999 Biomechanical lesion, unspecified: Secondary | ICD-10-CM

## 2018-07-26 NOTE — Assessment & Plan Note (Signed)
Patient back pain seems to be multifactorial.  Patient does respond well to manipulation.  Discussed icing regimen and home exercises.  Discussed topical anti-inflammatories.  Muscle relaxers for breakthrough pain and continue gabapentin.  Follow-up with me again in 3 to 4 weeks

## 2018-07-26 NOTE — Assessment & Plan Note (Signed)
Decision today to treat with OMT was based on Physical Exam  After verbal consent patient was treated with HVLA, ME, FPR techniques in cervical, thoracic, lumbar and sacral areas  Patient tolerated the procedure well with improvement in symptoms  Patient given exercises, stretches and lifestyle modifications  See medications in patient instructions if given  Patient will follow up in 4 weeks 

## 2018-07-26 NOTE — Assessment & Plan Note (Signed)
Injected today and tolerated the procedure well.  Discussed icing regimen at home exercise.  Discussed recheck to residual which wants to avoid.  Patient will follow-up with me again in 4 weeks and we will monitor to make sure that cyst is gone down

## 2018-07-26 NOTE — Patient Instructions (Signed)
Good to see you  Injected the finger, wil take 2 weeks to fully go away.  Ice is your friend See me again in 3-4 weeks

## 2018-08-15 NOTE — Assessment & Plan Note (Signed)
Decision today to treat with OMT was based on Physical Exam  After verbal consent patient was treated with HVLA, ME, FPR techniques in cervical, thoracic, rib, lumbar and sacral areas  Patient tolerated the procedure well with improvement in symptoms  Patient given exercises, stretches and lifestyle modifications  See medications in patient instructions if given  Patient will follow up in 4-6 weeks 

## 2018-08-15 NOTE — Progress Notes (Signed)
[facility] [address] [place], [place] [address] Phone: [phone] Subjective:   [name], Valerie Wolf, am serving as a scribe for Dr. Antoine PrimasZachary Chattie Greeson.   CC: Hand and back pain follow-up  [id number]:Subjective  Johann CapersAlvin L Vandeberg Sr. is a 42 y.o. male coming in with complaint of left hand pain. Is not having any pain in the hand since last visit.  Patient is also had back and neck pain.  Responded well to manipulation.  Patient states still having some mild tightness on the left side of his neck.  No radiation down the arms.  Patient has had some increasing stress recently.  Was pushing a parked car on the side of the street and may have caused some muscle pain.     Past Medical History:  Diagnosis Date  . Allergy   . Cataract   . Fibromyalgia   . Glaucoma    Past Surgical History:  Procedure Laterality Date  . EYE SURGERY    . HERNIA REPAIR     Social History   Socioeconomic History  . Marital status: Married    Spouse name: Not on file  . Number of children: Not on file  . Years of education: Not on file  . Highest education level: Not on file  Occupational History  . Not on file  Social Needs  . Financial resource strain: Not on file  . Food insecurity:    Worry: Not on file    Inability: Not on file  . Transportation needs:    Medical: Not on file    Non-medical: Not on file  Tobacco Use  . Smoking status: Never Smoker  . Smokeless tobacco: Never Used  Substance and Sexual Activity  . Alcohol use: No  . Drug use: No  . Sexual activity: Not on file  Lifestyle  . Physical activity:    Days per week: Not on file    Minutes per session: Not on file  . Stress: Not on file  Relationships  . Social connections:    Talks on phone: Not on file    Gets together: Not on file    Attends religious service: Not on file    Active member of club or organization: Not on file    Attends meetings of clubs or organizations: Not on file    Relationship  status: Not on file  Other Topics Concern  . Not on file  Social History Narrative  . Not on file   Allergies  Allergen Reactions  . Citrus Anaphylaxis  . Latex Rash   Family History  Problem Relation Age of Onset  . Diabetes Mother   . Hypertension Mother   . Hyperlipidemia Father   . Hypertension Father   . Stroke Father   . Hypertension Brother   . Cancer Maternal Grandmother   . Cancer Paternal Grandfather          Current Outpatient Medications (Other):  .  amoxicillin (AMOXIL) 500 MG capsule, Take 1 capsule (500 mg total) 2 (two) times daily by mouth. [name].  azithromycin (ZITHROMAX) 500 MG tablet, Take 1 tablet (500 mg total) by mouth daily. .  brimonidine (ALPHAGAN) 0.2 % ophthalmic solution, Place 1 drop into both eyes 2 (two) times daily.  .  dorzolamide-timolol (COSOPT) 22.3-6.8 MG/ML ophthalmic solution, Place 1 drop into both eyes 2 (two) times daily. .  DULoxetine (CYMBALTA) 20 MG capsule, Take 2 capsules (40 mg total) by mouth daily. [name].  gabapentin (NEURONTIN)  300 MG capsule, Take 1 capsule (300 mg total) by mouth 3 (three) times daily. .  tizanidine (ZANAFLEX) 2 MG capsule, Take 1 capsule (2 mg total) by mouth 3 (three) times daily as needed for muscle spasms. .  travoprost, benzalkonium, (TRAVATAN) 0.004 % ophthalmic solution, Place 1 drop into both eyes at bedtime. .  Vitamin D, Ergocalciferol, (DRISDOL) 50000 units CAPS capsule, Take 1 capsule (50,000 Units total) by mouth every 7 (seven) days.    Past medical history, social, surgical and family history all reviewed in electronic medical record.  No pertanent information unless stated regarding to the chief complaint.   Review of Systems:  No headache, visual changes, nausea, vomiting, diarrhea, constipation, dizziness, abdominal pain, skin rash, fevers, chills, night sweats, weight loss, swollen lymph nodes, body aches, joint swelling,  chest pain, shortness of breath, mood changes.  Positive muscle  aches  Objective  Blood pressure 118/84, pulse 81, height 6\' 2"  (1.88 m), SpO2 97 %.    General: No apparent distress alert and oriented x3 mood and affect normal, dressed appropriately.  HEENT: Pupils equal, extraocular movements intact  Respiratory: Patient's speak in full sentences and does not appear short of breath  Cardiovascular: No lower extremity edema, non tender, no erythema  Skin: Warm dry intact with no signs of infection or rash on extremities or on axial skeleton.  Abdomen: Soft nontender  Neuro: Cranial nerves II through XII are intact, neurovascularly intact in all extremities with 2+ DTRs and 2+ pulses.  Lymph: No lymphadenopathy of posterior or anterior cervical chain or axillae bilaterally.  Gait normal with good balance and coordination.  MSK:  Non tender with full range of motion and good stability and symmetric strength and tone of shoulders, elbows, wrist, hip, knee and ankles bilaterally.  Neck: Inspection loss of lordosis. No palpable stepoffs. Negative Spurling's maneuver. Full neck range of motion Grip strength and sensation normal in bilateral hands Strength good C4 to T1 distribution No sensory change to C4 to T1 Negative Hoffman sign bilaterally Reflexes normal Tightness of the musculature of the trapezius bilaterally.  Back Exam:  Inspection: Unremarkable  Motion: Flexion 45 deg, Extension 25 deg, Side Bending to 35 deg bilaterally,  Rotation to 45 deg bilaterally  SLR laying: Negative  XSLR laying: Negative  Palpable tenderness: Tender to palpation the paraspinal musculature in the lumbar spine right greater than left. FABER: Tightness bilaterally. Sensory change: Gross sensation intact to all lumbar and sacral dermatomes.  Reflexes: 2+ at both patellar tendons, 2+ at achilles tendons, Babinski's downgoing.  Strength at foot  Plantar-flexion: 5/5 Dorsi-flexion: 5/5 Eversion: 5/5 Inversion: 5/5  Leg strength  Quad: 5/5 Hamstring: 5/5 Hip  flexor: 5/5 Hip abductors: 5/5  Gait unremarkable.  Osteopathic findings Cervical C2 flexed rotated and side bent right C4 flexed rotated and side bent left C6 flexed rotated and side bent left T3 extended rotated and side bent right inhaled third rib T9 extended rotated and side bent left L2 flexed rotated and side bent right Sacrum right on right   Osteopathic findings  C7 flexed rotated and side bent left T3 extended rotated and side bent right inhaled third rib T5 extended rotated and side bent left L2 flexed rotated and side bent right Sacrum right on right    Impression and Recommendations:     This case required medical decision making of moderate complexity. The above documentation has been reviewed and is accurate and complete [name], DO       Note:  This dictation was prepared with Dragon dictation along with smaller phrase technology. Any transcriptional errors that result from this process are unintentional.

## 2018-08-15 NOTE — Assessment & Plan Note (Signed)
Multifactorial.  Discussed icing regimen and home exercises.  Discussed which activities to do which wants to avoid.  Increase activity slowly over the course the next several days.  Follow-up again in 4 to 6 weeks

## 2018-08-16 ENCOUNTER — Ambulatory Visit (INDEPENDENT_AMBULATORY_CARE_PROVIDER_SITE_OTHER): Payer: Medicare Other | Admitting: Family Medicine

## 2018-08-16 ENCOUNTER — Encounter: Payer: Self-pay | Admitting: Family Medicine

## 2018-08-16 VITALS — BP 118/84 | HR 81 | Ht 74.0 in

## 2018-08-16 DIAGNOSIS — G8929 Other chronic pain: Secondary | ICD-10-CM | POA: Diagnosis not present

## 2018-08-16 DIAGNOSIS — M545 Low back pain, unspecified: Secondary | ICD-10-CM

## 2018-08-16 DIAGNOSIS — M999 Biomechanical lesion, unspecified: Secondary | ICD-10-CM

## 2018-08-16 NOTE — Patient Instructions (Signed)
You da man! Ice is your friend Arnica lotion 2 times daily over the counter See me again in 4-5 weeks Happy New Year!

## 2018-08-30 DIAGNOSIS — J069 Acute upper respiratory infection, unspecified: Secondary | ICD-10-CM | POA: Diagnosis not present

## 2018-08-30 DIAGNOSIS — J029 Acute pharyngitis, unspecified: Secondary | ICD-10-CM | POA: Diagnosis not present

## 2018-09-17 NOTE — Progress Notes (Signed)
[name] [address] [place], [place] [address] Phone: [phone] Subjective:   [name], am serving as a scribe for Dr. Antoine Primas.   CC: lower back and neck pain follow up   [id number]  Bryce Capers Pinkley Sr. is a 43 y.o. male coming in with complaint of neck pain. Has history of rotator cuff tear and fractured collarbone. Is having left neck pain and shoulder pain. Has had relief from OMT for a couple of weeks but the pain comes back.      Past Medical History:  Diagnosis Date  . Allergy   . Cataract   . Fibromyalgia   . Glaucoma    Past Surgical History:  Procedure Laterality Date  . EYE SURGERY    . HERNIA REPAIR     Social History   Socioeconomic History  . Marital status: Married    Spouse name: Not on file  . Number of children: Not on file  . Years of education: Not on file  . Highest education level: Not on file  Occupational History  . Not on file  Social Needs  . Financial resource strain: Not on file  . Food insecurity:    Worry: Not on file    Inability: Not on file  . Transportation needs:    Medical: Not on file    Non-medical: Not on file  Tobacco Use  . Smoking status: Never Smoker  . Smokeless tobacco: Never Used  Substance and Sexual Activity  . Alcohol use: No  . Drug use: No  . Sexual activity: Not on file  Lifestyle  . Physical activity:    Days per week: Not on file    Minutes per session: Not on file  . Stress: Not on file  Relationships  . Social connections:    Talks on phone: Not on file    Gets together: Not on file    Attends religious service: Not on file    Active member of club or organization: Not on file    Attends meetings of clubs or organizations: Not on file    Relationship status: Not on file  Other Topics Concern  . Not on file  Social History Narrative  . Not on file   Allergies  Allergen Reactions  . Citrus Anaphylaxis  . Latex Rash   Family History   Problem Relation Age of Onset  . Diabetes Mother   . Hypertension Mother   . Hyperlipidemia Father   . Hypertension Father   . Stroke Father   . Hypertension Brother   . Cancer Maternal Grandmother   . Cancer Paternal Grandfather          Current Outpatient Medications (Other):  .  amoxicillin (AMOXIL) 500 MG capsule, Take 1 capsule (500 mg total) 2 (two) times daily by mouth. [name]  azithromycin (ZITHROMAX) 500 MG tablet, Take 1 tablet (500 mg total) by mouth daily. .  brimonidine (ALPHAGAN) 0.2 % ophthalmic solution, Place 1 drop into both eyes 2 (two) times daily.  .  dorzolamide-timolol (COSOPT) 22.3-6.8 MG/ML ophthalmic solution, Place 1 drop into both eyes 2 (two) times daily. .  DULoxetine (CYMBALTA) 20 MG capsule, Take 2 capsules (40 mg total) by mouth daily. [name]  gabapentin (NEURONTIN) 300 MG capsule, Take 1 capsule (300 mg total) by mouth 3 (three) times daily. .  tizanidine (ZANAFLEX) 2 MG capsule, Take 1 capsule (2 mg total) by mouth 3 (three) times daily as needed for muscle  spasms. .  travoprost, benzalkonium, (TRAVATAN) 0.004 % ophthalmic solution, Place 1 drop into both eyes at bedtime. .  Vitamin D, Ergocalciferol, (DRISDOL) 50000 units CAPS capsule, Take 1 capsule (50,000 Units total) by mouth every 7 (seven) days.    Past medical history, social, surgical and family history all reviewed in electronic medical record.  No pertanent information unless stated regarding to the chief complaint.   Review of Systems:  No headache, visual changes, nausea, vomiting, diarrhea, constipation, dizziness, abdominal pain, skin rash, fevers, chills, night sweats, weight loss, swollen lymph nodes, body aches, joint swelling,  chest pain, shortness of breath, mood changes.  Positive muscle aches  Objective  Blood pressure 118/86, pulse 80, height 6\' 2"  (1.88 m), weight 264 lb (119.7 kg), SpO2 97 %.   General: No apparent distress alert and oriented x3 mood and affect normal, dressed  appropriately.  HEENT: Pupils equal, extraocular movements intact  Respiratory: Patient's speak in full sentences and does not appear short of breath  Cardiovascular: No lower extremity edema, non tender, no erythema  Skin: Warm dry intact with no signs of infection or rash on extremities or on axial skeleton.  Abdomen: Soft nontender  Neuro: Cranial nerves II through XII are intact, neurovascularly intact in all extremities with 2+ DTRs and 2+ pulses.  Lymph: No lymphadenopathy of posterior or anterior cervical chain or axillae bilaterally.  Gait normal with good balance and coordination.  MSK:  Non tender with full range of motion and good stability and symmetric strength and tone of shoulders, elbows, wrist, hip, knee and ankles bilaterally.  Neck: Inspection loss of lordosis.  Mild keloid noted in the posterior aspect of the neck No palpable stepoffs. Negative Spurling's maneuver. Full neck range of motion Grip strength and sensation normal in bilateral hands Strength good C4 to T1 distribution No sensory change to C4 to T1 Negative Hoffman sign bilaterally Reflexes normal Tightness of the trapezius.  Back Exam:  Inspection: mild loss of lordosis  Motion: Flexion 45 deg, Extension 35 deg, Side Bending to 35 deg bilaterally,  Rotation to 45 deg bilaterally  SLR laying: Negative  XSLR laying: Negative  Palpable tenderness: Tender to palpation in paraspinal musculature lumbar spine. FABER: negative. Sensory change: Gross sensation intact to all lumbar and sacral dermatomes.  Reflexes: 2+ at both patellar tendons, 2+ at achilles tendons, Babinski's downgoing.  Strength at foot  Plantar-flexion: 5/5 Dorsi-flexion: 5/5 Eversion: 5/5 Inversion: 5/5  Leg strength  Quad: 5/5 Hamstring: 5/5 Hip flexor: 5/5 Hip abductors: 5/5  Gait unremarkable.  Osteopathic findings C2 flexed rotated and side bent right C6 flexed rotated and side bent left T3 extended rotated and side bent right  inhaled third rib T6 extended rotated and side bent left L2 flexed rotated and side bent right Sacrum right on right    Impression and Recommendations:     This case required medical decision making of moderate complexity. The above documentation has been reviewed and is accurate and complete [name], DO       Note: This dictation was prepared with Dragon dictation along with smaller phrase technology. Any transcriptional errors that result from this process are unintentional.

## 2018-09-19 ENCOUNTER — Ambulatory Visit (INDEPENDENT_AMBULATORY_CARE_PROVIDER_SITE_OTHER): Payer: Medicare Other | Admitting: Family Medicine

## 2018-09-19 ENCOUNTER — Encounter: Payer: Self-pay | Admitting: Family Medicine

## 2018-09-19 VITALS — BP 118/86 | HR 80 | Ht 74.0 in | Wt 264.0 lb

## 2018-09-19 DIAGNOSIS — G8929 Other chronic pain: Secondary | ICD-10-CM | POA: Diagnosis not present

## 2018-09-19 DIAGNOSIS — M25512 Pain in left shoulder: Secondary | ICD-10-CM | POA: Diagnosis not present

## 2018-09-19 DIAGNOSIS — M999 Biomechanical lesion, unspecified: Secondary | ICD-10-CM | POA: Diagnosis not present

## 2018-09-19 DIAGNOSIS — M545 Low back pain: Secondary | ICD-10-CM | POA: Diagnosis not present

## 2018-09-19 NOTE — Patient Instructions (Signed)
Good to see you  Ice is your friend [employer] See me again in 4 weeks

## 2018-09-19 NOTE — Assessment & Plan Note (Signed)
Decision today to treat with OMT was based on Physical Exam  After verbal consent patient was treated with HVLA, ME, FPR techniques in cervical, thoracic, lumbar and sacral areas  Patient tolerated the procedure well with improvement in symptoms  Patient given exercises, stretches and lifestyle modifications  See medications in patient instructions if given  Patient will follow up in 4 weeks 

## 2018-09-19 NOTE — Assessment & Plan Note (Signed)
Multifactorial Discussed HEP  Discussed which activities to do  Discuss HEP  RTC in 4-6 weeks

## 2018-09-22 DIAGNOSIS — H401133 Primary open-angle glaucoma, bilateral, severe stage: Secondary | ICD-10-CM | POA: Diagnosis not present

## 2018-10-18 ENCOUNTER — Ambulatory Visit (INDEPENDENT_AMBULATORY_CARE_PROVIDER_SITE_OTHER): Payer: Medicare Other | Admitting: Family Medicine

## 2018-10-18 ENCOUNTER — Encounter: Payer: Self-pay | Admitting: Family Medicine

## 2018-10-18 VITALS — BP 122/82 | HR 85 | Ht 74.0 in | Wt 262.0 lb

## 2018-10-18 DIAGNOSIS — M545 Low back pain, unspecified: Secondary | ICD-10-CM

## 2018-10-18 DIAGNOSIS — G8929 Other chronic pain: Secondary | ICD-10-CM | POA: Diagnosis not present

## 2018-10-18 DIAGNOSIS — M999 Biomechanical lesion, unspecified: Secondary | ICD-10-CM

## 2018-10-18 NOTE — Progress Notes (Signed)
[name] D.[facility] [address] [place], [place] [address] Phone: [phone] Subjective:    I Bryce [name] am serving as a Neurosurgeonscribe for Dr. Antoine PrimasZachary Sabin [name].   CC: Knee pain, back pain follow-up  BJY:[id number]:Subjective    Multifactorial Discussed HEP  Discussed which activities to do  Discuss HEP  RTC in 4-6 weeks.  Updated 10/18/2018  Bryce PillarAlvin L Vane Sr. is a 43 y.o. male coming in with complaint of back pain. States that he is in pain today. Patient has more tightness than thing else.  Has responded fairly well to osteopathic manipulation.  Still some tightness more in the upper thoracic spine than anything else he states today.  No radiation.     Past Medical History:  Diagnosis Date  . Allergy   . Cataract   . Fibromyalgia   . Glaucoma    Past Surgical History:  Procedure Laterality Date  . EYE SURGERY    . HERNIA REPAIR     Social History   Socioeconomic History  . Marital status: Married    Spouse name: Not on file  . Number of children: Not on file  . Years of education: Not on file  . Highest education level: Not on file  Occupational History  . Not on file  Social Needs  . Financial resource strain: Not on file  . Food insecurity:    Worry: Not on file    Inability: Not on file  . Transportation needs:    Medical: Not on file    Non-medical: Not on file  Tobacco Use  . Smoking status: Never Smoker  . Smokeless tobacco: Never Used  Substance and Sexual Activity  . Alcohol use: No  . Drug use: No  . Sexual activity: Not on file  Lifestyle  . Physical activity:    Days per week: Not on file    Minutes per session: Not on file  . Stress: Not on file  Relationships  . Social connections:    Talks on phone: Not on file    Gets together: Not on file    Attends religious service: Not on file    Active member of club or organization: Not on file    Attends meetings of clubs or organizations: Not on file    Relationship status: Not on  file  Other Topics Concern  . Not on file  Social History Narrative  . Not on file   Allergies  Allergen Reactions  . Citrus Anaphylaxis  . Latex Rash   Family History  Problem Relation Age of Onset  . Diabetes Mother   . Hypertension Mother   . Hyperlipidemia Father   . Hypertension Father   . Stroke Father   . Hypertension Brother   . Cancer Maternal Grandmother   . Cancer Paternal Grandfather          Current Outpatient Medications (Other):  .  amoxicillin (AMOXIL) 500 MG capsule, Take 1 capsule (500 mg total) 2 (two) times daily by mouth. [name].  azithromycin (ZITHROMAX) 500 MG tablet, Take 1 tablet (500 mg total) by mouth daily. .  brimonidine (ALPHAGAN) 0.2 % ophthalmic solution, Place 1 drop into both eyes 2 (two) times daily.  .  dorzolamide-timolol (COSOPT) 22.3-6.8 MG/ML ophthalmic solution, Place 1 drop into both eyes 2 (two) times daily. .  DULoxetine (CYMBALTA) 20 MG capsule, Take 2 capsules (40 mg total) by mouth daily. [name].  gabapentin (NEURONTIN) 300 MG capsule, Take 1 capsule (300 mg total)  by mouth 3 (three) times daily. .  tizanidine (ZANAFLEX) 2 MG capsule, Take 1 capsule (2 mg total) by mouth 3 (three) times daily as needed for muscle spasms. .  travoprost, benzalkonium, (TRAVATAN) 0.004 % ophthalmic solution, Place 1 drop into both eyes at bedtime. .  Vitamin D, Ergocalciferol, (DRISDOL) 50000 units CAPS capsule, Take 1 capsule (50,000 Units total) by mouth every 7 (seven) days.    Past medical history, social, surgical and family history all reviewed in electronic medical record.  No pertanent information unless stated regarding to the chief complaint.   Review of Systems:  No headache, visual changes, nausea, vomiting, diarrhea, constipation, dizziness, abdominal pain, skin rash, fevers, chills, night sweats, weight loss, swollen lymph nodes, body aches, joint swelling,  chest pain, shortness of breath, mood changes.  Positive muscle aches  Objective   Blood pressure 122/82, pulse 85, height 6\' 2"  (1.88 m), weight 262 lb (118.8 kg), SpO2 98 %.     General: No apparent distress alert and oriented x3 mood and affect normal, dressed appropriately.  HEENT: Pupils equal, extraocular movements intact  Respiratory: Patient's speak in full sentences and does not appear short of breath  Cardiovascular: No lower extremity edema, non tender, no erythema  Skin: Warm dry intact with no signs of infection or rash on extremities or on axial skeleton.  Abdomen: Soft nontender  Neuro: Cranial nerves II through XII are intact, neurovascularly intact in all extremities with 2+ DTRs and 2+ pulses.  Lymph: No lymphadenopathy of posterior or anterior cervical chain or axillae bilaterally.  Gait normal with good balance and coordination.  MSK:  Non tender with full range of motion and good stability and symmetric strength and tone of shoulders, elbows, wrist, hip, and ankles bilaterally.    Back Exam:  Inspection: Unremarkable  Motion: Flexion 30 deg, Extension 25 deg, Side Bending to 35 deg bilaterally,  Rotation to 35 deg bilaterally  SLR laying: Negative  XSLR laying: Negative  Palpable tenderness: Tender to palpation of paraspinal musculature lumbar spine right greater than left. FABER: Tightness bilaterally Sensory change: Gross sensation intact to all lumbar and sacral dermatomes.  Reflexes: 2+ at both patellar tendons, 2+ at achilles tendons, Babinski's downgoing.  Strength at foot  Plantar-flexion: 5/5 Dorsi-flexion: 5/5 Eversion: 5/5 Inversion: 5/5  Leg strength  Quad: 5/5 Hamstring: 5/5 Hip flexor: 5/5 Hip abductors: 5/5  Gait unremarkable.  Osteopathic findings  L2 flexed rotated and side bent right Sacrum right on right     Impression and Recommendations:     This case required medical decision making of moderate complexity. The above documentation has been reviewed and is accurate and complete [name], DO       Note:  This dictation was prepared with Dragon dictation along with smaller phrase technology. Any transcriptional errors that result from this process are unintentional.

## 2018-10-18 NOTE — Assessment & Plan Note (Signed)
Decision today to treat with OMT was based on Physical Exam  After verbal consent patient was treated with HVLA, ME, FPR techniques in  thoracic, lumbar and sacral areas  Patient tolerated the procedure well with improvement in symptoms  Patient given exercises, stretches and lifestyle modifications  See medications in patient instructions if given  Patient will follow up in 4 weeks 

## 2018-10-18 NOTE — Patient Instructions (Signed)
Good to see you  Bryce [name] is your friend Stay active Try to use a tennis ball at the muscle that get tight or try pulling on a door.  See me again in 4 weeks

## 2018-10-18 NOTE — Assessment & Plan Note (Signed)
Multifactorial.  Discussed with patient again about posture ergonomics and less core strengthening.  Patient has responded fairly well to osteopathic manipulation.  Responded well again today.  Discussed level did not does need to make these changes.  Follow-up again in 4 weeks

## 2018-11-10 DIAGNOSIS — H401133 Primary open-angle glaucoma, bilateral, severe stage: Secondary | ICD-10-CM | POA: Diagnosis not present

## 2018-11-16 NOTE — Progress Notes (Signed)
[name] [address] [place], [place] [address] Phone: [phone] Subjective:   Bryce [name], am serving as a scribe for Dr. Antoine Primas.   CC: Back pain follow-up   [id number]  Bryce Capers Roundy Sr. is a 43 y.o. male coming in with complaint of back pain. Patient is feeling tight today. Has been having good days and bad days.  Patient necessarily pain is 5 out of 10.  Some mild increase in discomfort     Past Medical History:  Diagnosis Date  . Allergy   . Cataract   . Fibromyalgia   . Glaucoma    Past Surgical History:  Procedure Laterality Date  . EYE SURGERY    . HERNIA REPAIR     Social History   Socioeconomic History  . Marital status: Married    Spouse name: Not on file  . Number of children: Not on file  . Years of education: Not on file  . Highest education level: Not on file  Occupational History  . Not on file  Social Needs  . Financial resource strain: Not on file  . Food insecurity:    Worry: Not on file    Inability: Not on file  . Transportation needs:    Medical: Not on file    Non-medical: Not on file  Tobacco Use  . Smoking status: Never Smoker  . Smokeless tobacco: Never Used  Substance and Sexual Activity  . Alcohol use: No  . Drug use: No  . Sexual activity: Not on file  Lifestyle  . Physical activity:    Days per week: Not on file    Minutes per session: Not on file  . Stress: Not on file  Relationships  . Social connections:    Talks on phone: Not on file    Gets together: Not on file    Attends religious service: Not on file    Active member of club or organization: Not on file    Attends meetings of clubs or organizations: Not on file    Relationship status: Not on file  Other Topics Concern  . Not on file  Social History Narrative  . Not on file   Allergies  Allergen Reactions  . Citrus Anaphylaxis  . Latex Rash   Family History  Problem Relation Age of Onset  . Diabetes  Mother   . Hypertension Mother   . Hyperlipidemia Father   . Hypertension Father   . Stroke Father   . Hypertension Brother   . Cancer Maternal Grandmother   . Cancer Paternal Grandfather          Current Outpatient Medications (Other):  .  amoxicillin (AMOXIL) 500 MG capsule, Take 1 capsule (500 mg total) 2 (two) times daily by mouth. [name]  azithromycin (ZITHROMAX) 500 MG tablet, Take 1 tablet (500 mg total) by mouth daily. .  brimonidine (ALPHAGAN) 0.2 % ophthalmic solution, Place 1 drop into both eyes 2 (two) times daily.  .  dorzolamide-timolol (COSOPT) 22.3-6.8 MG/ML ophthalmic solution, Place 1 drop into both eyes 2 (two) times daily. .  DULoxetine (CYMBALTA) 20 MG capsule, Take 2 capsules (40 mg total) by mouth daily. [name]  gabapentin (NEURONTIN) 300 MG capsule, Take 1 capsule (300 mg total) by mouth 3 (three) times daily. .  tizanidine (ZANAFLEX) 2 MG capsule, Take 1 capsule (2 mg total) by mouth 3 (three) times daily as needed for muscle spasms. .  travoprost, benzalkonium, (TRAVATAN) 0.004 % ophthalmic  solution, Place 1 drop into both eyes at bedtime. .  Vitamin D, Ergocalciferol, (DRISDOL) 50000 units CAPS capsule, Take 1 capsule (50,000 Units total) by mouth every 7 (seven) days.    Past medical history, social, surgical and family history all reviewed in electronic medical record.  No pertanent information unless stated regarding to the chief complaint.   Review of Systems:  No headache, visual changes, nausea, vomiting, diarrhea, constipation, dizziness, abdominal pain, skin rash, fevers, chills, night sweats, weight loss, swollen lymph nodes, body aches, joint swelling,  chest pain, shortness of breath, mood changes.  Positive muscle aches  Objective  Blood pressure 108/80, pulse 90, height 6\' 2"  (1.88 m), weight 260 lb (117.9 kg), SpO2 98 %.   General: No apparent distress alert and oriented x3 mood and affect normal, dressed appropriately.  HEENT: Pupils equal,  extraocular movements intact  Respiratory: Patient's speak in full sentences and does not appear short of breath  Cardiovascular: No lower extremity edema, non tender, no erythema  Skin: Warm dry intact with no signs of infection or rash on extremities or on axial skeleton.  Abdomen: Soft nontender  Neuro: Cranial nerves II through XII are intact, neurovascularly intact in all extremities with 2+ DTRs and 2+ pulses.  Lymph: No lymphadenopathy of posterior or anterior cervical chain or axillae bilaterally.  Gait normal with good balance and coordination.  MSK:  Non tender with full range of motion and good stability and symmetric strength and tone of shoulders, elbows, wrist, hip, knee and ankles bilaterally.   Back Exam:  Inspection: Unremarkable  Motion: Flexion 45 deg, Extension 25 deg, Side Bending to 45 deg bilaterally,  Rotation to 35 deg bilaterally  SLR laying: Negative  XSLR laying: Negative  Palpable tenderness: Tender to palpation paraspinal musculature. FABER: Tightness. Sensory change: Gross sensation intact to all lumbar and sacral dermatomes.  Reflexes: 2+ at both patellar tendons, 2+ at achilles tendons, Babinski's downgoing.  Strength at foot  Plantar-flexion: 5/5 Dorsi-flexion: 5/5 Eversion: 5/5 Inversion: 5/5  Leg strength  Quad: 5/5 Hamstring: 5/5 Hip flexor: 5/5 Hip abductors: 5/5    Osteopathic findings  T6 extended rotated and side bent left L3 flexed rotated and side bent right Sacrum right on right    Impression and Recommendations:     This case required medical decision making of moderate complexity. The above documentation has been reviewed and is accurate and complete [name], DO       Note: This dictation was prepared with Dragon dictation along with smaller phrase technology. Any transcriptional errors that result from this process are unintentional.

## 2018-11-17 ENCOUNTER — Encounter: Payer: Self-pay | Admitting: Family Medicine

## 2018-11-17 ENCOUNTER — Other Ambulatory Visit: Payer: Self-pay

## 2018-11-17 ENCOUNTER — Ambulatory Visit (INDEPENDENT_AMBULATORY_CARE_PROVIDER_SITE_OTHER): Payer: Medicare Other | Admitting: Family Medicine

## 2018-11-17 VITALS — BP 108/80 | HR 90 | Ht 74.0 in | Wt 260.0 lb

## 2018-11-17 DIAGNOSIS — M999 Biomechanical lesion, unspecified: Secondary | ICD-10-CM

## 2018-11-17 DIAGNOSIS — M545 Low back pain: Secondary | ICD-10-CM

## 2018-11-17 DIAGNOSIS — G8929 Other chronic pain: Secondary | ICD-10-CM | POA: Diagnosis not present

## 2018-11-17 NOTE — Assessment & Plan Note (Signed)
Multifactorial.  Doing relatively well overall though.  Responded well to osteopathic manipulation.  Discussed posture and ergonomics in which activities to do which wants to avoid.  Follow-up again in 4 to 8 weeks

## 2018-11-17 NOTE — Patient Instructions (Signed)
Good to see you  Ice is yoru friend You know the drill  See me again in 4-6 weeks

## 2018-11-17 NOTE — Assessment & Plan Note (Signed)
Decision today to treat with OMT was based on Physical Exam  After verbal consent patient was treated with HVLA, ME, FPR techniques in  thoracic, lumbar and sacral areas  Patient tolerated the procedure well with improvement in symptoms  Patient given exercises, stretches and lifestyle modifications  See medications in patient instructions if given  Patient will follow up in 4 weeks 

## 2018-12-22 ENCOUNTER — Ambulatory Visit: Payer: Medicare Other | Admitting: Family Medicine

## 2019-01-11 ENCOUNTER — Ambulatory Visit (INDEPENDENT_AMBULATORY_CARE_PROVIDER_SITE_OTHER): Payer: Medicare Other | Admitting: Family Medicine

## 2019-01-11 ENCOUNTER — Other Ambulatory Visit: Payer: Self-pay

## 2019-01-11 ENCOUNTER — Encounter: Payer: Self-pay | Admitting: Family Medicine

## 2019-01-11 ENCOUNTER — Ambulatory Visit: Payer: Self-pay

## 2019-01-11 VITALS — BP 110/70 | HR 89 | Ht 74.0 in | Wt 260.0 lb

## 2019-01-11 DIAGNOSIS — M79642 Pain in left hand: Secondary | ICD-10-CM | POA: Diagnosis not present

## 2019-01-11 DIAGNOSIS — M999 Biomechanical lesion, unspecified: Secondary | ICD-10-CM

## 2019-01-11 DIAGNOSIS — M545 Low back pain, unspecified: Secondary | ICD-10-CM

## 2019-01-11 DIAGNOSIS — M67442 Ganglion, left hand: Secondary | ICD-10-CM

## 2019-01-11 DIAGNOSIS — G8929 Other chronic pain: Secondary | ICD-10-CM

## 2019-01-11 NOTE — Assessment & Plan Note (Signed)
Decision today to treat with OMT was based on Physical Exam  After verbal consent patient was treated with HVLA, ME, FPR techniques in cervical, thoracic, lumbar and sacral areas  Patient tolerated the procedure well with improvement in symptoms  Patient given exercises, stretches and lifestyle modifications  See medications in patient instructions if given  Patient will follow up in 4-6 weeks 

## 2019-01-11 NOTE — Progress Notes (Signed)
[name] [address] [place], [place] [address] Phone: [phone] Subjective:   I Bryce [name] am serving as a Neurosurgeon for Dr. Antoine Primas.   CC: Back pain follow-up  NTZ:[id number]  Bryce [name] Bryce Sr. is a 43 y.o. male coming in with complaint of low back pain.  Patient has been seen multiple times.  Has been responding fairly well to osteopathic manipulation.  Patient states his back has been hurting really bad. Also has hand pain as well.  Has had neck pain for quite some time.  Having worsening symptoms at the moment.  Feels like he is having exacerbation of the fibromyalgia.  Patient is also had a finger cyst before and had had aspiration.  This was done 6 months ago.  Feels like it has increased again in size.    Past Medical History:  Diagnosis Date  . Allergy   . Cataract   . Fibromyalgia   . Glaucoma    Past Surgical History:  Procedure Laterality Date  . EYE SURGERY    . HERNIA REPAIR     Social History   Socioeconomic History  . Marital status: Married    Spouse name: Not on file  . Number of children: Not on file  . Years of education: Not on file  . Highest education level: Not on file  Occupational History  . Not on file  Social Needs  . Financial resource strain: Not on file  . Food insecurity:    Worry: Not on file    Inability: Not on file  . Transportation needs:    Medical: Not on file    Non-medical: Not on file  Tobacco Use  . Smoking status: Never Smoker  . Smokeless tobacco: Never Used  Substance and Sexual Activity  . Alcohol use: No  . Drug use: No  . Sexual activity: Not on file  Lifestyle  . Physical activity:    Days per week: Not on file    Minutes per session: Not on file  . Stress: Not on file  Relationships  . Social connections:    Talks on phone: Not on file    Gets together: Not on file    Attends religious service: Not on file    Active member of club or organization: Not on file     Attends meetings of clubs or organizations: Not on file    Relationship status: Not on file  Other Topics Concern  . Not on file  Social History Narrative  . Not on file   Allergies  Allergen Reactions  . Citrus Anaphylaxis  . Latex Rash   Family History  Problem Relation Age of Onset  . Diabetes Mother   . Hypertension Mother   . Hyperlipidemia Father   . Hypertension Father   . Stroke Father   . Hypertension Brother   . Cancer Maternal Grandmother   . Cancer Paternal Grandfather          Current Outpatient Medications (Other):  .  amoxicillin (AMOXIL) 500 MG capsule, Take 1 capsule (500 mg total) 2 (two) times daily by mouth. [name]  azithromycin (ZITHROMAX) 500 MG tablet, Take 1 tablet (500 mg total) by mouth daily. .  brimonidine (ALPHAGAN) 0.2 % ophthalmic solution, Place 1 drop into both eyes 2 (two) times daily.  .  dorzolamide-timolol (COSOPT) 22.3-6.8 MG/ML ophthalmic solution, Place 1 drop into both eyes 2 (two) times daily. .  DULoxetine (CYMBALTA) 20 MG capsule, Take 2  capsules (40 mg total) by mouth daily. [name].  gabapentin (NEURONTIN) 300 MG capsule, Take 1 capsule (300 mg total) by mouth 3 (three) times daily. .  tizanidine (ZANAFLEX) 2 MG capsule, Take 1 capsule (2 mg total) by mouth 3 (three) times daily as needed for muscle spasms. .  travoprost, benzalkonium, (TRAVATAN) 0.004 % ophthalmic solution, Place 1 drop into both eyes at bedtime. .  Vitamin D, Ergocalciferol, (DRISDOL) 50000 units CAPS capsule, Take 1 capsule (50,000 Units total) by mouth every 7 (seven) days.    Past medical history, social, surgical and family history all reviewed in electronic medical record.  No pertanent information unless stated regarding to the chief complaint.   Review of Systems:  No  visual changes, nausea, vomiting, diarrhea, constipation, dizziness, abdominal pain, skin rash, fevers, chills, night sweats, weight loss, swollen lymph nodes, , joint swelling, , chest pain,  shortness of breath, mood changes.  Positive muscle aches, headaches, body aches  Objective  Blood pressure 110/70, pulse 89, height 6\' 2"  (1.88 m), weight 260 lb (117.9 kg), SpO2 98 %.   General: No apparent distress alert and oriented x3 mood and affect normal, dressed appropriately.  HEENT: Pupils equal, extraocular movements intact  Respiratory: Patient's speak in full sentences and does not appear short of breath  Cardiovascular: No lower extremity edema, non tender, no erythema  Skin: Warm dry intact with no signs of infection or rash on extremities or on axial skeleton.  Abdomen: Soft nontender  Neuro: Cranial nerves II through XII are intact, neurovascularly intact in all extremities with 2+ DTRs and 2+ pulses.  Lymph: No lymphadenopathy of posterior or anterior cervical chain or axillae bilaterally.  Gait normal with good balance and coordination.  MSK:  Non tender with full range of motion and good stability and symmetric strength and tone of shoulders, elbows, wrist, hip, knee and ankles bilaterally.   Left hand exam shows over the index finger at the A2 pulley patient has some very small cystic formation noted over the joint itself. Back Exam:  Inspection: Loss of lordosis Motion: Flexion 35 deg, Extension 25 deg, Side Bending to deg bilaterally,  Rotation to 45 deg bilaterally  SLR laying: Negative  XSLR laying: Negative  Palpable tenderness: Diffusely tender in the paraspinal musculature lumbar spine.[name]. FABER: Positive bilaterally. Sensory change: Gross sensation intact to all lumbar and sacral dermatomes.  Reflexes: 2+ at both patellar tendons, 2+ at achilles tendons, Babinski's downgoing.  Strength at foot  Plantar-flexion: 5/5 Dorsi-flexion: 5/5 Eversion: 5/5 Inversion: 5/5  Leg strength  Quad: 5/5 Hamstring: 5/5 Hip flexor: 5/5 Hip abductors: 5/5  Gait unremarkable.   Procedure: Real-time Ultrasound Guided Injection of finger cyst in the MCP joint Device: GE Logiq  Q7 Ultrasound guided injection is preferred based studies that show increased duration, increased effect, greater accuracy, decreased procedural pain, increased response rate, and decreased cost with ultrasound guided versus blind injection.  Verbal informed consent obtained.  Time-out conducted.  Noted no overlying erythema, induration, or other signs of local infection.  Skin prepped in a sterile fashion.  Local anesthesia: Topical Ethyl chloride.  With sterile technique and under real time ultrasound guidance: With a 25-gauge half inch needle injected with 0.5 cc of 0.5% Marcaine and 0.5 cc of Kenalog 40 mg/mL Completed without difficulty  Pain immediately resolved suggesting accurate placement of the medication.  Advised to call if fevers/chills, erythema, induration, drainage, or persistent bleeding.  Images permanently stored and available for review in the ultrasound unit.  Impression:  Technically successful ultrasound guided injection. Osteopathic findings  C6 flexed rotated and side bent left T3 extended rotated and side bent right inhaled third rib T9 extended rotated and side bent left L2 flexed rotated and side bent right Sacrum right on right    Impression and Recommendations:     This case required medical decision making of moderate complexity. The above documentation has been reviewed and is accurate and complete [name], DO       Note: This dictation was prepared with Dragon dictation along with smaller phrase technology. Any transcriptional errors that result from this process are unintentional.

## 2019-01-11 NOTE — Assessment & Plan Note (Signed)
Repeat injection done today.  Tolerated procedure well.  Discussed icing regimen.  Discussed home exercise.  No change in medicine.  Does appear to be possibly intra-articular and may need to consider further imaging if it continues.

## 2019-01-11 NOTE — Assessment & Plan Note (Signed)
Stable, multifactorial, Fibromyalgia.  Follow-up with me again in 4 to 6 weeks.  Response to manipulation

## 2019-02-08 ENCOUNTER — Ambulatory Visit (INDEPENDENT_AMBULATORY_CARE_PROVIDER_SITE_OTHER): Payer: Medicare Other | Admitting: Family Medicine

## 2019-02-08 ENCOUNTER — Encounter: Payer: Self-pay | Admitting: Family Medicine

## 2019-02-08 ENCOUNTER — Other Ambulatory Visit: Payer: Self-pay

## 2019-02-08 VITALS — BP 138/100 | HR 96 | Ht 74.0 in | Wt 240.0 lb

## 2019-02-08 DIAGNOSIS — M545 Low back pain, unspecified: Secondary | ICD-10-CM

## 2019-02-08 DIAGNOSIS — G8929 Other chronic pain: Secondary | ICD-10-CM | POA: Diagnosis not present

## 2019-02-08 DIAGNOSIS — M999 Biomechanical lesion, unspecified: Secondary | ICD-10-CM | POA: Diagnosis not present

## 2019-02-08 NOTE — Progress Notes (Signed)
[facility] Scale[facility] Sajan Cheatwood D.O. [facility] [address] [place], [place] [address] Phone: [phone] Subjective:    I'm seeing this patient by the request  of:    CC: Back and neck pain  BJY:[id number]:Subjective     Update 02/08/2019: Bryce PillarAlvin L Wynns Sr. is a 43 y.o. male coming in with complaint of left hand and back pain. Patient states that his hand has full range of motion and is without pain.  Patient was found to have a long trigger finger previously and is doing much better with.  Is having back and neck pain. Is here for OMT to manage his pain.  Unable to take anti-inflammatories regularly secondary to other comorbidities.  Continues to have mild aches and pains.  Nothing no severe enough that stops him from activity.  Does a lot of the activity around the house.      Past Medical History:  Diagnosis Date  . Allergy   . Cataract   . Fibromyalgia   . Glaucoma    Past Surgical History:  Procedure Laterality Date  . EYE SURGERY    . HERNIA REPAIR     Social History   Socioeconomic History  . Marital status: Married    Spouse name: Not on file  . Number of children: Not on file  . Years of education: Not on file  . Highest education level: Not on file  Occupational History  . Not on file  Social Needs  . Financial resource strain: Not on file  . Food insecurity:    Worry: Not on file    Inability: Not on file  . Transportation needs:    Medical: Not on file    Non-medical: Not on file  Tobacco Use  . Smoking status: Never Smoker  . Smokeless tobacco: Never Used  Substance and Sexual Activity  . Alcohol use: No  . Drug use: No  . Sexual activity: Not on file  Lifestyle  . Physical activity:    Days per week: Not on file    Minutes per session: Not on file  . Stress: Not on file  Relationships  . Social connections:    Talks on phone: Not on file    Gets together: Not on file    Attends religious service: Not on file    Active member of club or organization:  Not on file    Attends meetings of clubs or organizations: Not on file    Relationship status: Not on file  Other Topics Concern  . Not on file  Social History Narrative  . Not on file   Allergies  Allergen Reactions  . Citrus Anaphylaxis  . Latex Rash   Family History  Problem Relation Age of Onset  . Diabetes Mother   . Hypertension Mother   . Hyperlipidemia Father   . Hypertension Father   . Stroke Father   . Hypertension Brother   . Cancer Maternal Grandmother   . Cancer Paternal Grandfather          Current Outpatient Medications (Other):  .  amoxicillin (AMOXIL) 500 MG capsule, Take 1 capsule (500 mg total) 2 (two) times daily by mouth. [name].  azithromycin (ZITHROMAX) 500 MG tablet, Take 1 tablet (500 mg total) by mouth daily. .  brimonidine (ALPHAGAN) 0.2 % ophthalmic solution, Place 1 drop into both eyes 2 (two) times daily.  .  dorzolamide-timolol (COSOPT) 22.3-6.8 MG/ML ophthalmic solution, Place 1 drop into both eyes 2 (two) times daily. .  DULoxetine (  CYMBALTA) 20 MG capsule, Take 2 capsules (40 mg total) by mouth daily. [name]  gabapentin (NEURONTIN) 300 MG capsule, Take 1 capsule (300 mg total) by mouth 3 (three) times daily. .  tizanidine (ZANAFLEX) 2 MG capsule, Take 1 capsule (2 mg total) by mouth 3 (three) times daily as needed for muscle spasms. .  travoprost, benzalkonium, (TRAVATAN) 0.004 % ophthalmic solution, Place 1 drop into both eyes at bedtime. .  Vitamin D, Ergocalciferol, (DRISDOL) 50000 units CAPS capsule, Take 1 capsule (50,000 Units total) by mouth every 7 (seven) days.    Past medical history, social, surgical and family history all reviewed in electronic medical record.  No pertanent information unless stated regarding to the chief complaint.   Review of Systems:  No headache, visual changes, nausea, vomiting, diarrhea, constipation, dizziness, abdominal pain, skin rash, fevers, chills, night sweats, weight loss, swollen lymph nodes, body aches,  joint swelling, chest pain, shortness of breath, mood changes.  Positive muscle aches  Objective  Blood pressure (!) 138/100, pulse 96, height 6\' 2"  (1.88 m), weight 240 lb (108.9 kg), SpO2 97 %.   General: No apparent distress alert and oriented x3 mood and affect normal, dressed appropriately.  HEENT: Pupils equal, extraocular movements intact  Respiratory: Patient's speak in full sentences and does not appear short of breath  Cardiovascular: No lower extremity edema, non tender, no erythema  Skin: Warm dry intact with no signs of infection or rash on extremities or on axial skeleton.  Abdomen: Soft nontender  Neuro: Cranial nerves II through XII are intact, neurovascularly intact in all extremities with 2+ DTRs and 2+ pulses.  Lymph: No lymphadenopathy of posterior or anterior cervical chain or axillae bilaterally.  Gait normal with good balance and coordination.  MSK:  Non tender with full range of motion and good stability and symmetric strength and tone of shoulders, elbows, wrist, hip, knee and ankles bilaterally.  Neck: Inspection loss of lordosis. No palpable stepoffs. Negative Spurling's maneuver. Full neck range of motion Grip strength and sensation normal in bilateral hands Strength good C4 to T1 distribution No sensory change to C4 to T1 Negative Hoffman sign bilaterally Reflexes normal Tightness of the trapezius left more in the paraspinal musculature and parascapular region.  Back Exam:  Inspection: Loss of lordosis Motion: Flexion 45 deg, Extension 20 deg, Side Bending to 35 deg bilaterally,  Rotation to 35 deg bilaterally  SLR laying: Negative  XSLR laying: Negative  Palpable tenderness: Tender to palpation in the paraspinal musculature lumbar spine right greater than left. FABER: Positive Faber right. Sensory change: Gross sensation intact to all lumbar and sacral dermatomes.  Reflexes: 2+ at both patellar tendons, 2+ at achilles tendons, Babinski's downgoing.   Strength at foot  Plantar-flexion: 5/5 Dorsi-flexion: 5/5 Eversion: 5/5 Inversion: 5/5  Leg strength  Quad: 5/5 Hamstring: 5/5 Hip flexor: 5/5 Hip abductors: 5/5  Gait unremarkable.  Osteopathic findings   T3 extended rotated and side bent right inhaled third rib T9 extended rotated and side bent left L2 flexed rotated and side bent right Sacrum right on right    Impression and Recommendations:     This case required medical decision making of moderate complexity. The above documentation has been reviewed and is accurate and complete [name], DO       Note: This dictation was prepared with Dragon dictation along with smaller phrase technology. Any transcriptional errors that result from this process are unintentional.

## 2019-02-08 NOTE — Assessment & Plan Note (Signed)
Decision today to treat with OMT was based on Physical Exam  After verbal consent patient was treated with HVLA, ME, FPR techniques in cervical, thoracic, lumbar and sacral areas  Patient tolerated the procedure well with improvement in symptoms  Patient given exercises, stretches and lifestyle modifications  See medications in patient instructions if given  Patient will follow up in 4-6 weeks 

## 2019-02-08 NOTE — Assessment & Plan Note (Signed)
Multifactorial.  Discussed with patient in great length.  Discussed icing regimen and home exercises.  Discussed core stability.  Patient doing relatively well with the gabapentin and muscle relaxer.  Change in medicines.  Follow-up again in 4 to 6 weeks

## 2019-02-08 NOTE — Patient Instructions (Addendum)
See me in 4-6 weeks

## 2019-03-08 ENCOUNTER — Other Ambulatory Visit: Payer: Self-pay

## 2019-03-08 ENCOUNTER — Encounter: Payer: Self-pay | Admitting: Family Medicine

## 2019-03-08 ENCOUNTER — Ambulatory Visit (INDEPENDENT_AMBULATORY_CARE_PROVIDER_SITE_OTHER): Payer: Medicare Other | Admitting: Family Medicine

## 2019-03-08 VITALS — BP 118/84 | HR 90 | Ht 74.0 in | Wt 240.0 lb

## 2019-03-08 DIAGNOSIS — G8929 Other chronic pain: Secondary | ICD-10-CM | POA: Diagnosis not present

## 2019-03-08 DIAGNOSIS — M1711 Unilateral primary osteoarthritis, right knee: Secondary | ICD-10-CM | POA: Diagnosis not present

## 2019-03-08 DIAGNOSIS — M999 Biomechanical lesion, unspecified: Secondary | ICD-10-CM | POA: Diagnosis not present

## 2019-03-08 DIAGNOSIS — M545 Low back pain: Secondary | ICD-10-CM

## 2019-03-08 NOTE — Assessment & Plan Note (Signed)
Tectorial with underlying fibromyalgia less likely contributing to her back pain.  Has responded fairly well to osteopathic manipulation.  Discussed with patient in great length about icing regimen, home exercises, weight loss.  Increase activity as tolerated.  Follow-up again in 4 to 8 weeks

## 2019-03-08 NOTE — Patient Instructions (Addendum)
Injected knee today Gel injection will try to get approved See me again in 4-5 weeks

## 2019-03-08 NOTE — Progress Notes (Signed)
[name] [address] [place], [place] [address] Phone: [phone] Subjective:   [name], am serving as a scribe for Dr. Hulan Saas.   CC:  Back pain follow-up  EPP:[id number]  Bryce [name] Sr. is a 43 y.o. male coming in with complaint of back pain. Is here for OMT to manage his back pain. Last visit on 02/08/2019. Patient states that his back has been doing better.  Some mild tightness.  Responding well to manipulation.  Feels like he does need manipulation.  Also having right knee pain on medial aspect. History of torn meniscus. Pain had gone away for a while but has been increasing for the past month. Pain with deep knee bending. Patient is having catching motion. Pain when walking backwards.  Known arthritic changes of the patellofemoral joint.  Started having worsening pain going up and down stairs as well.  May be some mild increasing instability.      Past Medical History:  Diagnosis Date  . Allergy   . Cataract   . Fibromyalgia   . Glaucoma    Past Surgical History:  Procedure Laterality Date  . EYE SURGERY    . HERNIA REPAIR     Social History   Socioeconomic History  . Marital status: Married    Spouse name: Not on file  . Number of children: Not on file  . Years of education: Not on file  . Highest education level: Not on file  Occupational History  . Not on file  Social Needs  . Financial resource strain: Not on file  . Food insecurity    Worry: Not on file    Inability: Not on file  . Transportation needs    Medical: Not on file    Non-medical: Not on file  Tobacco Use  . Smoking status: Never Smoker  . Smokeless tobacco: Never Used  Substance and Sexual Activity  . Alcohol use: No  . Drug use: No  . Sexual activity: Not on file  Lifestyle  . Physical activity    Days per week: Not on file    Minutes per session: Not on file  . Stress: Not on file  Relationships  . Social Herbalist on  phone: Not on file    Gets together: Not on file    Attends religious service: Not on file    Active member of club or organization: Not on file    Attends meetings of clubs or organizations: Not on file    Relationship status: Not on file  Other Topics Concern  . Not on file  Social History Narrative  . Not on file   Allergies  Allergen Reactions  . Citrus Anaphylaxis  . Latex Rash   Family History  Problem Relation Age of Onset  . Diabetes Mother   . Hypertension Mother   . Hyperlipidemia Father   . Hypertension Father   . Stroke Father   . Hypertension Brother   . Cancer Maternal Grandmother   . Cancer Paternal Grandfather          Current Outpatient Medications (Other):  .  amoxicillin (AMOXIL) 500 MG capsule, Take 1 capsule (500 mg total) 2 (two) times daily by mouth. [name]  azithromycin (ZITHROMAX) 500 MG tablet, Take 1 tablet (500 mg total) by mouth daily. .  brimonidine (ALPHAGAN) 0.2 % ophthalmic solution, Place 1 drop into both eyes 2 (two) times daily.  .  dorzolamide-timolol (COSOPT) 22.3-6.8  MG/ML ophthalmic solution, Place 1 drop into both eyes 2 (two) times daily. .  DULoxetine (CYMBALTA) 20 MG capsule, Take 2 capsules (40 mg total) by mouth daily. [name].  gabapentin (NEURONTIN) 300 MG capsule, Take 1 capsule (300 mg total) by mouth 3 (three) times daily. .  tizanidine (ZANAFLEX) 2 MG capsule, Take 1 capsule (2 mg total) by mouth 3 (three) times daily as needed for muscle spasms. .  travoprost, benzalkonium, (TRAVATAN) 0.004 % ophthalmic solution, Place 1 drop into both eyes at bedtime. .  Vitamin D, Ergocalciferol, (DRISDOL) 50000 units CAPS capsule, Take 1 capsule (50,000 Units total) by mouth every 7 (seven) days.    Past medical history, social, surgical and family history all reviewed in electronic medical record.  No pertanent information unless stated regarding to the chief complaint.   Review of Systems:  No headache, visual changes, nausea, vomiting,  diarrhea, constipation, dizziness, abdominal pain, skin rash, fevers, chills, night sweats, weight loss, swollen lymph nodes, body aches, joint swelling, muscle aches, Mild positive muscle aches  Objective  Blood pressure 118/84, pulse 90, height 6\' 2"  (1.88 m), weight 240 lb (108.9 kg), SpO2 97 %.   General: No apparent distress alert and oriented x3 mood and affect normal, dressed appropriately.  HEENT:, extraocular movements intact wears glasses Respiratory: Patient's speak in full sentences and does not appear short of breath  Cardiovascular: No lower extremity edema, non tender, no erythema  Skin: Warm dry intact with no signs of infection or rash on extremities or on axial skeleton.  Abdomen: Soft nontender  Neuro: Cranial nerves II through XII are intact, neurovascularly intact in all extremities with 2+ DTRs and 2+ pulses.  Lymph: No lymphadenopathy of posterior or anterior cervical chain or axillae bilaterally.  Gait normal with good balance and coordination.  MSK:  Non tender with full range of motion and good stability and symmetric strength and tone of shoulders, elbows, wrist, hip and ankles bilaterally.  Right knee exam shows the patient does have some crepitus noted.  Mild lateral translocation of the kneecap.  Tenderness over the patellofemoral area medial joint space.  Mild abnormal thigh to calf ratio and some mild instability with valgus force   Back Exam:  Inspection: Mild loss of lordosis Motion: Flexion 35 deg, Extension 20 deg, Side Bending to 35 deg bilaterally,  Rotation to 45 deg bilaterally  SLR laying: Negative  XSLR laying: Negative  Palpable tenderness: Tender to palpation the paraspinal musculature lumbar spine diffusely. FABER: Tightness bilaterally. Sensory change: Gross sensation intact to all lumbar and sacral dermatomes.  Reflexes: 2+ at both patellar tendons, 2+ at achilles tendons, Babinski's downgoing.  Strength at foot  Plantar-flexion: 5/5  Dorsi-flexion: 5/5 Eversion: 5/5 Inversion: 5/5  Leg strength  Quad: 5/5 Hamstring: 5/5 Hip flexor: 5/5 Hip abductors: 5/5  Gait unremarkable.  After informed written and verbal consent, patient was seated on exam table. Right knee was prepped with alcohol swab and utilizing anterolateral approach, patient's right knee space was injected with 4:1  marcaine 0.5%: Kenalog 40mg /dL. Patient tolerated the procedure well without immediate complications.  Osteopathic findings  C7 flexed rotated and side bent left T3 extended rotated and side bent right inhaled third rib T9 extended rotated and side bent left L2 flexed rotated and side bent right Sacrum right on right    Impression and Recommendations:     This case required medical decision making of moderate complexity. The above documentation has been reviewed and is accurate and complete Judi SaaZachary M Tamalyn Wadsworth,  DO       Note: This dictation was prepared with Dragon dictation along with smaller phrase technology. Any transcriptional errors that result from this process are unintentional.

## 2019-03-08 NOTE — Assessment & Plan Note (Signed)
Decision today to treat with OMT was based on Physical Exam  After verbal consent patient was treated with HVLA, ME, FPR techniques in cervical, thoracic, rib,  lumbar and sacral areas  Patient tolerated the procedure well with improvement in symptoms  Patient given exercises, stretches and lifestyle modifications  See medications in patient instructions if given  Patient will follow up in 4-8 weeks 

## 2019-03-08 NOTE — Assessment & Plan Note (Signed)
Injection given  Discussed HEP  Discussed visco supplementation  RTC in 4 weeks

## 2019-04-06 ENCOUNTER — Ambulatory Visit (INDEPENDENT_AMBULATORY_CARE_PROVIDER_SITE_OTHER): Payer: Medicare Other | Admitting: Family Medicine

## 2019-04-06 ENCOUNTER — Encounter: Payer: Self-pay | Admitting: Family Medicine

## 2019-04-06 ENCOUNTER — Other Ambulatory Visit: Payer: Self-pay

## 2019-04-06 VITALS — BP 124/90 | HR 78 | Ht 74.0 in | Wt 240.0 lb

## 2019-04-06 DIAGNOSIS — G8929 Other chronic pain: Secondary | ICD-10-CM

## 2019-04-06 DIAGNOSIS — M999 Biomechanical lesion, unspecified: Secondary | ICD-10-CM

## 2019-04-06 DIAGNOSIS — M545 Low back pain: Secondary | ICD-10-CM | POA: Diagnosis not present

## 2019-04-06 NOTE — Patient Instructions (Signed)
See me again in 4-5 weeks 

## 2019-04-06 NOTE — Progress Notes (Signed)
[name] [place],  [address] Phone: [phone] Subjective:   Bryce [name], am serving as a scribe for Dr. Hulan Saas.  I'm seeing this patient by the request  of:    CC: Back pain follow-up  GEX:[id number]  Bryce Rhein Benthall Sr. is a 43 y.o. male coming in with complaint of back pain. Has been having increase in pain over the past couple of weeks due to stress.  Patient states has been doing relatively well but has had some increasing stress.  Doing more around the house with his wife likely having surgery in the near future for knee replacement.  Patient states that the repetitive lifting seems to give some difficulty and pain.     Past Medical History:  Diagnosis Date  . Allergy   . Cataract   . Fibromyalgia   . Glaucoma    Past Surgical History:  Procedure Laterality Date  . EYE SURGERY    . HERNIA REPAIR     Social History   Socioeconomic History  . Marital status: Married    Spouse name: Not on file  . Number of children: Not on file  . Years of education: Not on file  . Highest education level: Not on file  Occupational History  . Not on file  Social Needs  . Financial resource strain: Not on file  . Food insecurity    Worry: Not on file    Inability: Not on file  . Transportation needs    Medical: Not on file    Non-medical: Not on file  Tobacco Use  . Smoking status: Never Smoker  . Smokeless tobacco: Never Used  Substance and Sexual Activity  . Alcohol use: [name]  . Drug use: [name]  . Sexual activity: Not on file  Lifestyle  . Physical activity    Days per week: Not on file    Minutes per session: Not on file  . Stress: Not on file  Relationships  . Social Herbalist on phone: Not on file    Gets together: Not on file    Attends religious service: Not on file    Active member of club or organization: Not on file    Attends meetings of clubs or organizations: Not on file     Relationship status: Not on file  Other Topics Concern  . Not on file  Social History Narrative  . Not on file   Allergies  Allergen Reactions  . Citrus Anaphylaxis  . Latex Rash   Family History  Problem Relation Age of Onset  . Diabetes Mother   . Hypertension Mother   . Hyperlipidemia Father   . Hypertension Father   . Stroke Father   . Hypertension Brother   . Cancer Maternal Grandmother   . Cancer Paternal Grandfather          Current Outpatient Medications (Other):  .  amoxicillin (AMOXIL) 500 MG capsule, Take 1 capsule (500 mg total) 2 (two) times daily by mouth. [name]  azithromycin (ZITHROMAX) 500 MG tablet, Take 1 tablet (500 mg total) by mouth daily. .  brimonidine (ALPHAGAN) 0.2 % ophthalmic solution, Place 1 drop into both eyes 2 (two) times daily.  .  dorzolamide-timolol (COSOPT) 22.3-6.8 MG/ML ophthalmic solution, Place 1 drop into both eyes 2 (two) times daily. .  DULoxetine (CYMBALTA) 20 MG capsule, Take 2 capsules (40 mg total) by mouth daily. [name]  gabapentin (NEURONTIN) 300 MG capsule,  Take 1 capsule (300 mg total) by mouth 3 (three) times daily. .  tizanidine (ZANAFLEX) 2 MG capsule, Take 1 capsule (2 mg total) by mouth 3 (three) times daily as needed for muscle spasms. .  travoprost, benzalkonium, (TRAVATAN) 0.004 % ophthalmic solution, Place 1 drop into both eyes at bedtime. .  Vitamin D, Ergocalciferol, (DRISDOL) 50000 units CAPS capsule, Take 1 capsule (50,000 Units total) by mouth every 7 (seven) days.    Past medical history, social, surgical and family history all reviewed in electronic medical record.  [name] pertanent information unless stated regarding to the chief complaint.   Review of Systems:  [name] headache, visual changes, nausea, vomiting, diarrhea, constipation, dizziness, abdominal pain, skin rash, fevers, chills, night sweats, weight loss, swollen lymph nodes,, chest pain, shortness of breath, mood changes.  Positive muscle aches, body  aches  Objective  Blood pressure 124/90, pulse 78, height 6\' 2"  (1.88 m), weight 240 lb (108.9 kg), SpO2 97 %.    General: [name] apparent distress alert and oriented x3 mood and affect normal, dressed appropriately.  HEENT: Pupils equal, extraocular movements intact  Respiratory: Patient's speak in full sentences and does not appear short of breath  Cardiovascular: [name] lower extremity edema, non tender, [name] erythema  Skin: Warm dry intact with [name] signs of infection or rash on extremities or on axial skeleton.  Abdomen: Soft nontender  Neuro: Cranial nerves II through XII are intact, neurovascularly intact in all extremities with 2+ DTRs and 2+ pulses.  Lymph: [name] lymphadenopathy of posterior or anterior cervical chain or axillae bilaterally.  Gait normal with good balance and coordination.  MSK:  tender with full range of motion and good stability and symmetric strength and tone of shoulders, elbows, wrist, hip, knee and ankles bilaterally.  Tender to palpation as well as tightness with in the paraspinal musculature lumbar spine diffusely.  More tightness with hamstring tightness with the [facility] test as well.  Osteopathic findings   T6 extended rotated and side bent left L3 flexed rotated and side bent left  Sacrum right on right  Impression and Recommendations:     This case required medical decision making of moderate complexity. The above documentation has been reviewed and is accurate and complete Bryce SaaZachary M Tadarrius Burch, DO       Note: This dictation was prepared with Dragon dictation along with smaller phrase technology. Any transcriptional errors that result from this process are unintentional.

## 2019-04-07 ENCOUNTER — Encounter: Payer: Self-pay | Admitting: Family Medicine

## 2019-04-07 NOTE — Assessment & Plan Note (Signed)
Stable.  Discussed icing regimen and home exercise.  Discussed which activities to do which was to avoid.  Patient is to increase activity slowly over the course the next several weeks.  Patient will follow-up with me again 4 to 8 weeks.  No significant change in management

## 2019-04-07 NOTE — Assessment & Plan Note (Signed)
Decision today to treat with OMT was based on Physical Exam  After verbal consent patient was treated with HVLA, ME, FPR techniques in  thoracic, lumbar and sacral areas  Patient tolerated the procedure well with improvement in symptoms  Patient given exercises, stretches and lifestyle modifications  See medications in patient instructions if given  Patient will follow up in 4-6 weeks 

## 2019-05-08 DIAGNOSIS — H401133 Primary open-angle glaucoma, bilateral, severe stage: Secondary | ICD-10-CM | POA: Diagnosis not present

## 2019-05-15 NOTE — Progress Notes (Signed)
Bryce [name] D.O. [facility] [address] [place], [place] [address] Phone: [phone] Subjective:   Bryce [name], Bryce [name], am serving as a scribe for Dr. Antoine PrimasZachary Lailany [name].   CC: Back pain follow-up and new injury to left knee  BJY:[id number]:Subjective   04/06/2019 Stable.  Discussed icing regimen and home exercise.  Discussed which activities to do which was to avoid.  Patient is to increase activity slowly over the course the next several weeks.  Patient will follow-up with me again 4 to 8 weeks.  No significant change in management  Update 05/16/2019 Bryce PillarAlvin L Presswood Sr. is a 43 y.o. male coming in with complaint of back and knee pain. Last knee injection was 02/02/2018 in right knee.  Patient states that he is having left knee pain today. Stepped back on a dog bone and his knee gave way medially. Pain is worse with standing. Is present with sitting. Injury occurred 2 weeks ago. Denies any instability.   Neck pain is still there.  Still significant tightness.  Does respond well to manipulation.  Denies any fevers or chills, minimal radicular symptoms.      Past Medical History:  Diagnosis Date  . Allergy   . Cataract   . Fibromyalgia   . Glaucoma    Past Surgical History:  Procedure Laterality Date  . EYE SURGERY    . HERNIA REPAIR     Social History   Socioeconomic History  . Marital status: Married    Spouse name: Not on file  . Number of children: Not on file  . Years of education: Not on file  . Highest education level: Not on file  Occupational History  . Not on file  Social Needs  . Financial resource strain: Not on file  . Food insecurity    Worry: Not on file    Inability: Not on file  . Transportation needs    Medical: Not on file    Non-medical: Not on file  Tobacco Use  . Smoking status: Never Smoker  . Smokeless tobacco: Never Used  Substance and Sexual Activity  . Alcohol use: No  . Drug use: No  . Sexual activity: Not on file  Lifestyle  . Physical  activity    Days per week: Not on file    Minutes per session: Not on file  . Stress: Not on file  Relationships  . Social Musicianconnections    Talks on phone: Not on file    Gets together: Not on file    Attends religious service: Not on file    Active member of club or organization: Not on file    Attends meetings of clubs or organizations: Not on file    Relationship status: Not on file  Other Topics Concern  . Not on file  Social History Narrative  . Not on file   Allergies  Allergen Reactions  . Citrus Anaphylaxis  . Latex Rash   Family History  Problem Relation Age of Onset  . Diabetes Mother   . Hypertension Mother   . Hyperlipidemia Father   . Hypertension Father   . Stroke Father   . Hypertension Brother   . Cancer Maternal Grandmother   . Cancer Paternal Grandfather          Current Outpatient Medications (Other):  .  amoxicillin (AMOXIL) 500 MG capsule, Take 1 capsule (500 mg total) 2 (two) times daily by mouth. [name].  azithromycin (ZITHROMAX) 500 MG tablet, Take 1 tablet (500 mg  total) by mouth daily. .  brimonidine (ALPHAGAN) 0.2 % ophthalmic solution, Place 1 drop into both eyes 2 (two) times daily.  .  dorzolamide-timolol (COSOPT) 22.3-6.8 MG/ML ophthalmic solution, Place 1 drop into both eyes 2 (two) times daily. .  DULoxetine (CYMBALTA) 20 MG capsule, Take 2 capsules (40 mg total) by mouth daily. [name]  gabapentin (NEURONTIN) 300 MG capsule, Take 1 capsule (300 mg total) by mouth 3 (three) times daily. .  tizanidine (ZANAFLEX) 2 MG capsule, Take 1 capsule (2 mg total) by mouth 3 (three) times daily as needed for muscle spasms. .  travoprost, benzalkonium, (TRAVATAN) 0.004 % ophthalmic solution, Place 1 drop into both eyes at bedtime. .  Vitamin D, Ergocalciferol, (DRISDOL) 50000 units CAPS capsule, Take 1 capsule (50,000 Units total) by mouth every 7 (seven) days.    Past medical history, social, surgical and family history all reviewed in electronic medical  record.  No pertanent information unless stated regarding to the chief complaint.   Review of Systems:  No headache, visual changes, nausea, vomiting, diarrhea, constipation, dizziness, abdominal pain, skin rash, fevers, chills, night sweats, weight loss, swollen lymph nodes, body aches, joint swelling, chest pain, shortness of breath, mood changes.  Positive muscle aches  Objective  Blood pressure 124/76, pulse 88, height 6\' 2"  (1.88 m), weight 258 lb (117 kg), SpO2 97 %.   General: No apparent distress alert and oriented x3 mood and affect normal, dressed appropriately.  HEENT: Pupils equal, extraocular movements intact  Respiratory: Patient's speak in full sentences and does not appear short of breath  Cardiovascular: No lower extremity edema, non tender, no erythema  Skin: Warm dry intact with no signs of infection or rash on extremities or on axial skeleton.  Abdomen: Soft nontender  Neuro: Cranial nerves II through XII are intact, neurovascularly intact in all extremities with 2+ DTRs and 2+ pulses.  Lymph: No lymphadenopathy of posterior or anterior cervical chain or axillae bilaterally.  Gait normal with good balance and coordination.  MSK:  Non tender with full range of motion and good stability and symmetric strength and tone of shoulders, elbows, wrist, hip, knee and ankles bilaterally.  Left knee exam shows near full range of motion lacking last 5 degrees of flexion.  Tender to palpation over the medial joint line.  Positive McMurray's noted.  Mild crepitus  Limited musculoskeletal ultrasound was performed and interpreted by Lyndal Pulley  Limited ultrasound of patient's knee shows the patient has an acute medial meniscal tear with about 10% of displacement noted.  Hypoechoic changes.  Medial plica also noted Impression: The medial meniscal tear   Neck: Inspection unremarkable. No palpable stepoffs. Negative Spurling's maneuver. Full neck range of motion Grip strength and  sensation normal in bilateral hands Strength good C4 to T1 distribution No sensory change to C4 to T1 Negative Hoffman sign bilaterally Reflexes normal  Back Exam:  Inspection: Unremarkable  Motion: Flexion 25 deg, Extension 25 deg, Side Bending to 45 deg bilaterally,  Rotation to 45 deg bilaterally  SLR laying: Negative  XSLR laying: Negative  Palpable tenderness: Tender to palpation in the paraspinal musculature lumbar spine right and left. FABER: negative. Sensory change: Gross sensation intact to all lumbar and sacral dermatomes.  Reflexes: 2+ at both patellar tendons, 2+ at achilles tendons, Babinski's downgoing.  Strength at foot  Plantar-flexion: 5/5 Dorsi-flexion: 5/5 Eversion: 5/5 Inversion: 5/5  Leg strength  Quad: 5/5 Hamstring: 5/5 Hip flexor: 5/5 Hip abductors: 5/5  Gait unremarkable.  Osteopathic findings  T9 extended rotated and side bent left L2 flexed rotated and side bent right Sacrum right on right    Impression and Recommendations:     This case required medical decision making of moderate complexity. The above documentation has been reviewed and is accurate and complete [name], DO       Note: This dictation was prepared with Dragon dictation along with smaller phrase technology. Any transcriptional errors that result from this process are unintentional.

## 2019-05-16 ENCOUNTER — Ambulatory Visit: Payer: Self-pay

## 2019-05-16 ENCOUNTER — Other Ambulatory Visit: Payer: Self-pay

## 2019-05-16 ENCOUNTER — Ambulatory Visit (INDEPENDENT_AMBULATORY_CARE_PROVIDER_SITE_OTHER): Payer: Medicare Other | Admitting: Family Medicine

## 2019-05-16 ENCOUNTER — Encounter: Payer: Self-pay | Admitting: Family Medicine

## 2019-05-16 VITALS — BP 124/76 | HR 88 | Ht 74.0 in | Wt 258.0 lb

## 2019-05-16 DIAGNOSIS — M25562 Pain in left knee: Secondary | ICD-10-CM

## 2019-05-16 DIAGNOSIS — M797 Fibromyalgia: Secondary | ICD-10-CM | POA: Diagnosis not present

## 2019-05-16 DIAGNOSIS — G8929 Other chronic pain: Secondary | ICD-10-CM | POA: Diagnosis not present

## 2019-05-16 DIAGNOSIS — S83207A Unspecified tear of unspecified meniscus, current injury, left knee, initial encounter: Secondary | ICD-10-CM | POA: Diagnosis not present

## 2019-05-16 DIAGNOSIS — M999 Biomechanical lesion, unspecified: Secondary | ICD-10-CM

## 2019-05-16 NOTE — Assessment & Plan Note (Signed)
Continues to have some difficulty.  Discussed posture and ergonomics, discussed which activities to potentially avoid.  Patient is to increase activity slowly.  Follow-up again in 4 to 8 weeks

## 2019-05-16 NOTE — Patient Instructions (Signed)
Exercises 3x a week Avoid twisting See me in 4 weeks

## 2019-05-16 NOTE — Assessment & Plan Note (Signed)
Tearing noted.  Discussed icing regimen and home exercises, which activities to do which wants to avoid.  Follow-up again in 4 to 8 weeks

## 2019-05-16 NOTE — Assessment & Plan Note (Signed)
Decision today to treat with OMT was based on Physical Exam  After verbal consent patient was treated with HVLA, ME, FPR techniques in  thoracic, lumbar and sacral areas  Patient tolerated the procedure well with improvement in symptoms  Patient given exercises, stretches and lifestyle modifications  See medications in patient instructions if given  Patient will follow up in 4-8 weeks 

## 2019-06-14 ENCOUNTER — Ambulatory Visit (INDEPENDENT_AMBULATORY_CARE_PROVIDER_SITE_OTHER): Payer: Medicare Other | Admitting: Family Medicine

## 2019-06-14 ENCOUNTER — Other Ambulatory Visit: Payer: Self-pay

## 2019-06-14 ENCOUNTER — Encounter: Payer: Self-pay | Admitting: Family Medicine

## 2019-06-14 VITALS — BP 110/70 | HR 69 | Ht 74.0 in

## 2019-06-14 DIAGNOSIS — M999 Biomechanical lesion, unspecified: Secondary | ICD-10-CM | POA: Diagnosis not present

## 2019-06-14 DIAGNOSIS — G8929 Other chronic pain: Secondary | ICD-10-CM

## 2019-06-14 DIAGNOSIS — M545 Low back pain: Secondary | ICD-10-CM

## 2019-06-14 NOTE — Patient Instructions (Signed)
Good to see you  Great talking to you Vote ;) See em again in 6 weeks

## 2019-06-14 NOTE — Assessment & Plan Note (Signed)
Decision today to treat with OMT was based on Physical Exam  After verbal consent patient was treated with HVLA, ME, FPR techniques in  thoracic, lumbar and sacral areas  Patient tolerated the procedure well with improvement in symptoms  Patient given exercises, stretches and lifestyle modifications  See medications in patient instructions if given  Patient will follow up in 4-8 weeks 

## 2019-06-14 NOTE — Assessment & Plan Note (Signed)
Back pain overall.  Seems to be more multifactorial with muscle imbalances.  Discussed posture and ergonomics, no change in medications, responds well to the osteopathic manipulation.  Follow-up again in 4 to 8 weeks

## 2019-06-14 NOTE — Progress Notes (Signed)
[name] [place], [place] [address] Phone: [phone] Subjective:   [name], am serving as a scribe for Dr. Hulan Saas.   CC: L knee pain  FIE:[id number]    05/16/19: Tearing noted.  Discussed icing regimen and home exercises, which activities to do which wants to avoid.  Follow-up again in 4 to 8 weeks  Update- 06/14/19: Bryce Heft Sr. is a 43 y.o. male coming in with complaint of L knee pain. Has not had pain since last visit.   Patient states that his upper back is bothering him especially on the left side. The entire back is stiff in the mornings with little reprieve as the day progresses. Has been trying to stretch.  More of the prednisone.  Does not stop him from activity but did have some discomfort from time to time.     Past Medical History:  Diagnosis Date  . Allergy   . Cataract   . Fibromyalgia   . Glaucoma    Past Surgical History:  Procedure Laterality Date  . EYE SURGERY    . HERNIA REPAIR     Social History   Socioeconomic History  . Marital status: Married    Spouse name: Not on file  . Number of children: Not on file  . Years of education: Not on file  . Highest education level: Not on file  Occupational History  . Not on file  Social Needs  . Financial resource strain: Not on file  . Food insecurity    Worry: Not on file    Inability: Not on file  . Transportation needs    Medical: Not on file    Non-medical: Not on file  Tobacco Use  . Smoking status: Never Smoker  . Smokeless tobacco: Never Used  Substance and Sexual Activity  . Alcohol use: No  . Drug use: No  . Sexual activity: Not on file  Lifestyle  . Physical activity    Days per week: Not on file    Minutes per session: Not on file  . Stress: Not on file  Relationships  . Social Herbalist on phone: Not on file    Gets together: Not on file    Attends religious service: Not on file    Active member of  club or organization: Not on file    Attends meetings of clubs or organizations: Not on file    Relationship status: Not on file  Other Topics Concern  . Not on file  Social History Narrative  . Not on file   Allergies  Allergen Reactions  . Citrus Anaphylaxis  . Latex Rash   Family History  Problem Relation Age of Onset  . Diabetes Mother   . Hypertension Mother   . Hyperlipidemia Father   . Hypertension Father   . Stroke Father   . Hypertension Brother   . Cancer Maternal Grandmother   . Cancer Paternal Grandfather          Current Outpatient Medications (Other):  .  amoxicillin (AMOXIL) 500 MG capsule, Take 1 capsule (500 mg total) 2 (two) times daily by mouth. [name]  azithromycin (ZITHROMAX) 500 MG tablet, Take 1 tablet (500 mg total) by mouth daily. .  brimonidine (ALPHAGAN) 0.2 % ophthalmic solution, Place 1 drop into both eyes 2 (two) times daily.  .  dorzolamide-timolol (COSOPT) 22.3-6.8 MG/ML ophthalmic solution, Place 1 drop into both eyes 2 (two) times daily. [name]  DULoxetine (CYMBALTA) 20 MG capsule, Take 2 capsules (40 mg total) by mouth daily. [name]  gabapentin (NEURONTIN) 300 MG capsule, Take 1 capsule (300 mg total) by mouth 3 (three) times daily. .  tizanidine (ZANAFLEX) 2 MG capsule, Take 1 capsule (2 mg total) by mouth 3 (three) times daily as needed for muscle spasms. .  travoprost, benzalkonium, (TRAVATAN) 0.004 % ophthalmic solution, Place 1 drop into both eyes at bedtime. .  Vitamin D, Ergocalciferol, (DRISDOL) 50000 units CAPS capsule, Take 1 capsule (50,000 Units total) by mouth every 7 (seven) days.    Past medical history, social, surgical and family history all reviewed in electronic medical record.  No pertanent information unless stated regarding to the chief complaint.   Review of Systems:  No headache, visual changes, nausea, vomiting, diarrhea, constipation, dizziness, abdominal pain, skin rash, fevers, chills, night sweats, weight loss, swollen  lymph nodes, body aches, joint swelling,  chest pain, shortness of breath, mood changes.  Positive muscle aches  Objective  Blood pressure 110/70, pulse 69, height 6\' 2"  (1.88 m), SpO2 98 %.    General: No apparent distress alert and oriented x3 mood and affect normal, dressed appropriately.  HEENT: Pupils equal, extraocular movements intact  Respiratory: Patient's speak in full sentences and does not appear short of breath  Cardiovascular: No lower extremity edema, non tender, no erythema  Skin: Warm dry intact with no signs of infection or rash on extremities or on axial skeleton.  Abdomen: Soft nontender  Neuro: Cranial nerves II through XII are intact, neurovascularly intact in all extremities with 2+ DTRs and 2+ pulses.  Lymph: No lymphadenopathy of posterior or anterior cervical chain or axillae bilaterally.  Gait normal with good balance and coordination.  MSK:  tender with full range of motion and good stability and symmetric strength and tone of shoulders, elbows, wrist, hip, knee and ankles bilaterally.   Neck exam is some loss lordosis.  Tightness noted in the paraspinal musculature right greater than left.  Negative straight leg test, positive on the right side.  Deep tendon reflexes intact distally.  Osteopathic findings  T9 extended rotated and side bent left L2 flexed rotated and side bent right Sacrum right on right    Impression and Recommendations:     This case required medical decision making of moderate complexity. The above documentation has been reviewed and is accurate and complete Pearlean Brownie, DO       Note: This dictation was prepared with Dragon dictation along with smaller phrase technology. Any transcriptional errors that result from this process are unintentional.

## 2019-06-22 DIAGNOSIS — H401133 Primary open-angle glaucoma, bilateral, severe stage: Secondary | ICD-10-CM | POA: Diagnosis not present

## 2019-07-24 DIAGNOSIS — H401133 Primary open-angle glaucoma, bilateral, severe stage: Secondary | ICD-10-CM | POA: Diagnosis not present

## 2019-07-26 ENCOUNTER — Ambulatory Visit (INDEPENDENT_AMBULATORY_CARE_PROVIDER_SITE_OTHER): Payer: Medicare Other | Admitting: Family Medicine

## 2019-07-26 ENCOUNTER — Encounter: Payer: Self-pay | Admitting: Family Medicine

## 2019-07-26 VITALS — BP 120/80 | HR 91 | Ht 74.0 in | Wt 258.0 lb

## 2019-07-26 DIAGNOSIS — M999 Biomechanical lesion, unspecified: Secondary | ICD-10-CM | POA: Diagnosis not present

## 2019-07-26 DIAGNOSIS — G8929 Other chronic pain: Secondary | ICD-10-CM

## 2019-07-26 DIAGNOSIS — M545 Low back pain: Secondary | ICD-10-CM | POA: Diagnosis not present

## 2019-07-26 NOTE — Progress Notes (Signed)
[name] [place], [place] [address] Phone: [phone] Subjective:   I Bryce [name] am serving as a Education administrator for Dr. Hulan Saas.  I'm seeing this patient by the request  of:    CC: Back pain follow-up  ANV:[id number]   06/14/2019 Back pain overall.  Seems to be more multifactorial with muscle imbalances.  Discussed posture and ergonomics, no change in medications, responds well to the osteopathic manipulation.  Follow-up again in 4 to 8 weeks  Update 07/26/2019 Bryce Heft Sr. is a 43 y.o. male coming in with complaint of back pain. Patient states his back has been painful. OMT.  Tightness overall.  Patient has not been doing the exercises quite as frequently.  Patient states that the tightness is worse when he does not do as much activity.     Past Medical History:  Diagnosis Date  . Allergy   . Cataract   . Fibromyalgia   . Glaucoma    Past Surgical History:  Procedure Laterality Date  . EYE SURGERY    . HERNIA REPAIR     Social History   Socioeconomic History  . Marital status: Married    Spouse name: Not on file  . Number of children: Not on file  . Years of education: Not on file  . Highest education level: Not on file  Occupational History  . Not on file  Social Needs  . Financial resource strain: Not on file  . Food insecurity    Worry: Not on file    Inability: Not on file  . Transportation needs    Medical: Not on file    Non-medical: Not on file  Tobacco Use  . Smoking status: Never Smoker  . Smokeless tobacco: Never Used  Substance and Sexual Activity  . Alcohol use: No  . Drug use: No  . Sexual activity: Not on file  Lifestyle  . Physical activity    Days per week: Not on file    Minutes per session: Not on file  . Stress: Not on file  Relationships  . Social Herbalist on phone: Not on file    Gets together: Not on file    Attends religious service: Not on file    Active member  of club or organization: Not on file    Attends meetings of clubs or organizations: Not on file    Relationship status: Not on file  Other Topics Concern  . Not on file  Social History Narrative  . Not on file   Allergies  Allergen Reactions  . Citrus Anaphylaxis  . Latex Rash   Family History  Problem Relation Age of Onset  . Diabetes Mother   . Hypertension Mother   . Hyperlipidemia Father   . Hypertension Father   . Stroke Father   . Hypertension Brother   . Cancer Maternal Grandmother   . Cancer Paternal Grandfather          Current Outpatient Medications (Other):  .  amoxicillin (AMOXIL) 500 MG capsule, Take 1 capsule (500 mg total) 2 (two) times daily by mouth. [name]  azithromycin (ZITHROMAX) 500 MG tablet, Take 1 tablet (500 mg total) by mouth daily. .  brimonidine (ALPHAGAN) 0.2 % ophthalmic solution, Place 1 drop into both eyes 2 (two) times daily.  .  dorzolamide-timolol (COSOPT) 22.3-6.8 MG/ML ophthalmic solution, Place 1 drop into both eyes 2 (two) times daily. .  DULoxetine (CYMBALTA) 20 MG capsule,  Take 2 capsules (40 mg total) by mouth daily. [name]  gabapentin (NEURONTIN) 300 MG capsule, Take 1 capsule (300 mg total) by mouth 3 (three) times daily. .  tizanidine (ZANAFLEX) 2 MG capsule, Take 1 capsule (2 mg total) by mouth 3 (three) times daily as needed for muscle spasms. .  travoprost, benzalkonium, (TRAVATAN) 0.004 % ophthalmic solution, Place 1 drop into both eyes at bedtime. .  Vitamin D, Ergocalciferol, (DRISDOL) 50000 units CAPS capsule, Take 1 capsule (50,000 Units total) by mouth every 7 (seven) days.    Past medical history, social, surgical and family history all reviewed in electronic medical record.  No pertanent information unless stated regarding to the chief complaint.   Review of Systems:  No headache, visual changes, nausea, vomiting, diarrhea, constipation, dizziness, abdominal pain, skin rash, fevers, chills, night sweats, weight loss, swollen  lymph nodes, body aches, joint swelling, muscle aches, chest pain, shortness of breath, mood changes.   Objective  Blood pressure 120/80, pulse 91, height 6\' 2"  (1.88 m), weight 258 lb (117 kg), SpO2 97 %.    General: No apparent distress alert and oriented x3 mood and affect normal, dressed appropriately.  HEENT: Pupils equal, extraocular movements intact  Respiratory: Patient's speak in full sentences and does not appear short of breath  Cardiovascular: No lower extremity edema, non tender, no erythema  Skin: Warm dry intact with no signs of infection or rash on extremities or on axial skeleton.  Abdomen: Soft nontender  Neuro: Cranial nerves II through XII are intact, neurovascularly intact in all extremities with 2+ DTRs and 2+ pulses.  Lymph: No lymphadenopathy of posterior or anterior cervical chain or axillae bilaterally.  Gait normal with good balance and coordination.  MSK:  Non tender with full range of motion and good stability and symmetric strength and tone of shoulders, elbows, wrist, hip, knee and ankles bilaterally.  Back exam does have some loss of lordosis.  Tender to palpation in the paraspinal musculature lumbar spine right greater than left.  Tightness with bilaterally right greater than left.  Negative straight leg test.  Osteopathic findings  T9 extended rotated and side bent left L2 flexed rotated and side bent right Sacrum right on right     Impression and Recommendations:     This case required medical decision making of moderate complexity. The above documentation has been reviewed and is accurate and complete Pearlean Brownie, DO       Note: This dictation was prepared with Dragon dictation along with smaller phrase technology. Any transcriptional errors that result from this process are unintentional.

## 2019-07-26 NOTE — Patient Instructions (Signed)
See me again in 4-6 weeks  

## 2019-07-27 ENCOUNTER — Encounter: Payer: Self-pay | Admitting: Family Medicine

## 2019-07-27 NOTE — Assessment & Plan Note (Signed)
Decision today to treat with OMT was based on Physical Exam  After verbal consent patient was treated with HVLA, ME, FPR techniques in  thoracic, lumbar and sacral areas  Patient tolerated the procedure well with improvement in symptoms  Patient given exercises, stretches and lifestyle modifications  See medications in patient instructions if given  Patient will follow up in 4-8 weeks 

## 2019-07-27 NOTE — Assessment & Plan Note (Signed)
Back pain does have some tightness more around the sacroiliac joint.  Discussed with patient about hip abductor strengthening and hip flexor tightness discussed ergonomics throughout the day.  Follow-up again in 4 to 8 weeks

## 2019-07-28 ENCOUNTER — Ambulatory Visit (INDEPENDENT_AMBULATORY_CARE_PROVIDER_SITE_OTHER): Payer: Medicare Other | Admitting: *Deleted

## 2019-07-28 DIAGNOSIS — Z23 Encounter for immunization: Secondary | ICD-10-CM | POA: Diagnosis not present

## 2019-08-21 NOTE — Progress Notes (Signed)
[name] [address] [place], [place] [address] Phone: [phone] Subjective:   [name], am serving as a scribe for Dr. Hulan Saas.  This visit occurred during the SARS-CoV-2 public health emergency.  Safety protocols were in place, including screening questions prior to the visit, additional usage of staff PPE, and extensive cleaning of exam room while observing appropriate contact time as indicated for disinfecting solutions.  I'm seeing this patient by the request  of:    CC: Low back pain, neck pain, knee pain follow-up  HEN:[id number]   05/16/2019 Tearing noted.  Discussed icing regimen and home exercises, which activities to do which wants to avoid.  Follow-up again in 4 to 8 weeks 07/26/2019 Back pain does have some tightness more around the sacroiliac joint.  Discussed with patient about hip abductor strengthening and hip flexor tightness discussed ergonomics throughout the day.  Follow-up again in 4 to 8 weeks  Update 08/22/2019 [name] is a 43 y.o. male coming in with complaint of back pain. Using OMT to treat back pain. Patient states that his neck is tight on left side near base of skull. States that this week his pain is increasing. Uses gabapentin and has tried pennsaid.  Overall doing relatively well.  Still some dull throbbing aching pain but nothing that stops him from activity at the moment.    Past Medical History:  Diagnosis Date  . Allergy   . Cataract   . Fibromyalgia   . Glaucoma    Past Surgical History:  Procedure Laterality Date  . EYE SURGERY    . HERNIA REPAIR     Social History   Socioeconomic History  . Marital status: Married    Spouse name: Not on file  . Number of children: Not on file  . Years of education: Not on file  . Highest education level: Not on file  Occupational History  . Not on file  Tobacco Use  . Smoking status: Never Smoker  . Smokeless tobacco: Never Used  Substance  and Sexual Activity  . Alcohol use: No  . Drug use: No  . Sexual activity: Not on file  Other Topics Concern  . Not on file  Social History Narrative  . Not on file   Social Determinants of Health   Financial Resource Strain:   . Difficulty of Paying Living Expenses: Not on file  Food Insecurity:   . Worried About Charity fundraiser in the Last Year: Not on file  . Ran Out of Food in the Last Year: Not on file  Transportation Needs:   . Lack of Transportation (Medical): Not on file  . Lack of Transportation (Non-Medical): Not on file  Physical Activity:   . Days of Exercise per Week: Not on file  . Minutes of Exercise per Session: Not on file  Stress:   . Feeling of Stress : Not on file  Social Connections:   . Frequency of Communication with Friends and Family: Not on file  . Frequency of Social Gatherings with Friends and Family: Not on file  . Attends Religious Services: Not on file  . Active Member of Clubs or Organizations: Not on file  . Attends Archivist Meetings: Not on file  . Marital Status: Not on file   Allergies  Allergen Reactions  . Citrus Anaphylaxis  . Latex Rash   Family History  Problem Relation Age of Onset  . Diabetes Mother   .  Hypertension Mother   . Hyperlipidemia Father   . Hypertension Father   . Stroke Father   . Hypertension Brother   . Cancer Maternal Grandmother   . Cancer Paternal Grandfather          Current Outpatient Medications (Other):  .  amoxicillin (AMOXIL) 500 MG capsule, Take 1 capsule (500 mg total) 2 (two) times daily by mouth. [name]  azithromycin (ZITHROMAX) 500 MG tablet, Take 1 tablet (500 mg total) by mouth daily. .  brimonidine (ALPHAGAN) 0.2 % ophthalmic solution, Place 1 drop into both eyes 2 (two) times daily.  .  dorzolamide-timolol (COSOPT) 22.3-6.8 MG/ML ophthalmic solution, Place 1 drop into both eyes 2 (two) times daily. .  DULoxetine (CYMBALTA) 20 MG capsule, Take 2 capsules (40 mg total) by  mouth daily. [name]  gabapentin (NEURONTIN) 300 MG capsule, Take 1 capsule (300 mg total) by mouth 3 (three) times daily. .  tizanidine (ZANAFLEX) 2 MG capsule, Take 1 capsule (2 mg total) by mouth 3 (three) times daily as needed for muscle spasms. .  travoprost, benzalkonium, (TRAVATAN) 0.004 % ophthalmic solution, Place 1 drop into both eyes at bedtime. .  Vitamin D, Ergocalciferol, (DRISDOL) 50000 units CAPS capsule, Take 1 capsule (50,000 Units total) by mouth every 7 (seven) days.    Past medical history, social, surgical and family history all reviewed in electronic medical record.  No pertanent information unless stated regarding to the chief complaint.   Review of Systems:  No headache, visual changes, nausea, vomiting, diarrhea, constipation, dizziness, abdominal pain, skin rash, fevers, chills, night sweats, weight loss, swollen lymph nodes, body aches, joint swelling, chest pain, shortness of breath, mood changes.  Positive muscle aches  Objective  Blood pressure 108/70, pulse 91, height 6\' 2"  (1.88 m), weight 269 lb (122 kg), SpO2 98 %.    General: No apparent distress alert and oriented x3 mood and affect normal, dressed appropriately.  HEENT: Pupils equal, extraocular movements intact  Respiratory: Patient's speak in full sentences and does not appear short of breath  Cardiovascular: No lower extremity edema, non tender, no erythema  Skin: Warm dry intact with no signs of infection or rash on extremities or on axial skeleton.  Abdomen: Soft nontender  Neuro: Cranial nerves II through XII are intact, neurovascularly intact in all extremities with 2+ DTRs and 2+ pulses.  Lymph: No lymphadenopathy of posterior or anterior cervical chain or axillae bilaterally.  Gait normal with good balance and coordination.  MSK:  Non tender with full range of motion and good stability and symmetric strength and tone of shoulders, elbows, wrist, hipand ankles bilaterally.  Knee: Normal to  inspection with no erythema or effusion or obvious bony abnormalities. Palpation normal with no warmth, joint line tenderness, patellar tenderness, or condyle tenderness. ROM full in flexion and extension and lower leg rotation. Ligaments with solid consistent endpoints including ACL, PCL, LCL, MCL. Negative Mcmurray's, Apley's, and Thessalonian tests. Non painful patellar compression. Patellar glide without crepitus. Patellar and quadriceps tendons unremarkable. Hamstring and quadriceps strength is normal.  Neck: Inspection loss of lordosis. No palpable stepoffs. Negative Spurling's maneuver. Grip strength and sensation normal in bilateral hands Strength good C4 to T1 distribution No sensory change to C4 to T1 Negative Hoffman sign bilaterally Reflexes normal  Back Exam:  Inspection: Unremarkable  Motion: Flexion 40 deg, Extension 30 deg, Side Bending to 45 deg bilaterally,  Rotation to 45 deg bilaterally  SLR laying: Negative  XSLR laying: Negative  Palpable tenderness: Tender to palpation over  the paraspinal musculature lumbar spine. FABER: Tightness bilaterally. Sensory change: Gross sensation intact to all lumbar and sacral dermatomes.  Reflexes: 2+ at both patellar tendons, 2+ at achilles tendons, Babinski's downgoing.  Strength at foot  Plantar-flexion: 5/5 Dorsi-flexion: 5/5 Eversion: 5/5 Inversion: 5/5  Leg strength  Quad: 5/5 Hamstring: 4/5 Hip flexor: 5/5 Hip abductors: 5/5  Gait unremarkable.  Osteopathic findings  T7 extended rotated and side bent left L2 flexed rotated and side bent right Sacrum right on right    Impression and Recommendations:     This case required medical decision making of moderate complexity. The above documentation has been reviewed and is accurate and complete Judi SaaZachary M Katrine Radich, DO       Note: This dictation was prepared with Dragon dictation along with smaller phrase technology. Any transcriptional errors that result from this  process are unintentional.

## 2019-08-22 ENCOUNTER — Encounter: Payer: Self-pay | Admitting: Family Medicine

## 2019-08-22 ENCOUNTER — Other Ambulatory Visit: Payer: Self-pay

## 2019-08-22 ENCOUNTER — Ambulatory Visit (INDEPENDENT_AMBULATORY_CARE_PROVIDER_SITE_OTHER): Payer: Medicare Other | Admitting: Family Medicine

## 2019-08-22 VITALS — BP 108/70 | HR 91 | Ht 74.0 in | Wt 269.0 lb

## 2019-08-22 DIAGNOSIS — G8929 Other chronic pain: Secondary | ICD-10-CM

## 2019-08-22 DIAGNOSIS — M545 Low back pain: Secondary | ICD-10-CM

## 2019-08-22 DIAGNOSIS — M999 Biomechanical lesion, unspecified: Secondary | ICD-10-CM

## 2019-08-22 NOTE — Assessment & Plan Note (Signed)
Decision today to treat with OMT was based on Physical Exam  After verbal consent patient was treated with HVLA, ME, FPR techniques in  thoracic, lumbar and sacral areas  Patient tolerated the procedure well with improvement in symptoms  Patient given exercises, stretches and lifestyle modifications  See medications in patient instructions if given  Patient will follow up in 4-8 weeks 

## 2019-08-22 NOTE — Assessment & Plan Note (Signed)
Low back pain, discussed again about core strengthening, which activities to do which wants to avoid.  Discussed which activities to do which wants to avoid.  Follow-up again in 4 to 8 weeks

## 2019-08-22 NOTE — Patient Instructions (Addendum)
  [address], [address] [place], [place] [address] Phone [phone]  See me again in 4 weeks Happy Holidays!

## 2019-09-18 ENCOUNTER — Other Ambulatory Visit: Payer: Self-pay

## 2019-09-18 ENCOUNTER — Ambulatory Visit (INDEPENDENT_AMBULATORY_CARE_PROVIDER_SITE_OTHER): Payer: Medicare Other | Admitting: Family Medicine

## 2019-09-18 VITALS — BP 118/80 | HR 78 | Ht 74.0 in | Wt 267.0 lb

## 2019-09-18 DIAGNOSIS — M797 Fibromyalgia: Secondary | ICD-10-CM

## 2019-09-18 DIAGNOSIS — M999 Biomechanical lesion, unspecified: Secondary | ICD-10-CM | POA: Diagnosis not present

## 2019-09-18 NOTE — Patient Instructions (Signed)
See me in 4 weeks 

## 2019-09-18 NOTE — Progress Notes (Signed)
[name] [address] [place] [address] Phone: [phone] Subjective:   Bryce [name], am serving as a scribe for Dr. Antoine Primas. This visit occurred during the SARS-CoV-2 public health emergency.  Safety protocols were in place, including screening questions prior to the visit, additional usage of staff PPE, and extensive cleaning of exam room while observing appropriate contact time as indicated for disinfecting solutions.   CC: Back pain follow-up  BJY:[id number]  Bryce Capers Edmundson Sr. is a 44 y.o. male coming in with complaint of back pain. Last seen on 08/22/2019 for OMT. Patient states that he has been having good and bad days. Weather affects his entire body.  Patient is still having pain that seems to be out of proportion.  Sometimes pain can be unrelenting and just tightness all over. Patient though does not want to add any more medications    Past Medical History:  Diagnosis Date  . Allergy   . Cataract   . Fibromyalgia   . Glaucoma    Past Surgical History:  Procedure Laterality Date  . EYE SURGERY    . HERNIA REPAIR     Social History   Socioeconomic History  . Marital status: Married    Spouse name: Not on file  . Number of children: Not on file  . Years of education: Not on file  . Highest education level: Not on file  Occupational History  . Not on file  Tobacco Use  . Smoking status: Never Smoker  . Smokeless tobacco: Never Used  Substance and Sexual Activity  . Alcohol use: No  . Drug use: No  . Sexual activity: Not on file  Other Topics Concern  . Not on file  Social History Narrative  . Not on file   Social Determinants of Health   Financial Resource Strain:   . Difficulty of Paying Living Expenses: Not on file  Food Insecurity:   . Worried About Programme researcher, broadcasting/film/video in the Last Year: Not on file  . Ran Out of Food in the Last Year: Not on file  Transportation Needs:   . Lack of Transportation  (Medical): Not on file  . Lack of Transportation (Non-Medical): Not on file  Physical Activity:   . Days of Exercise per Week: Not on file  . Minutes of Exercise per Session: Not on file  Stress:   . Feeling of Stress : Not on file  Social Connections:   . Frequency of Communication with Friends and Family: Not on file  . Frequency of Social Gatherings with Friends and Family: Not on file  . Attends Religious Services: Not on file  . Active Member of Clubs or Organizations: Not on file  . Attends Banker Meetings: Not on file  . Marital Status: Not on file   Allergies  Allergen Reactions  . Citrus Anaphylaxis  . Latex Rash   Family History  Problem Relation Age of Onset  . Diabetes Mother   . Hypertension Mother   . Hyperlipidemia Father   . Hypertension Father   . Stroke Father   . Hypertension Brother   . Cancer Maternal Grandmother   . Cancer Paternal Grandfather          Current Outpatient Medications (Other):  .  amoxicillin (AMOXIL) 500 MG capsule, Take 1 capsule (500 mg total) 2 (two) times daily by mouth. [name]  azithromycin (ZITHROMAX) 500 MG tablet, Take 1 tablet (500 mg total) by mouth daily. [name]  brimonidine (ALPHAGAN) 0.2 % ophthalmic solution, Place 1 drop into both eyes 2 (two) times daily.  .  dorzolamide-timolol (COSOPT) 22.3-6.8 MG/ML ophthalmic solution, Place 1 drop into both eyes 2 (two) times daily. .  DULoxetine (CYMBALTA) 20 MG capsule, Take 2 capsules (40 mg total) by mouth daily. [name]  gabapentin (NEURONTIN) 300 MG capsule, Take 1 capsule (300 mg total) by mouth 3 (three) times daily. .  tizanidine (ZANAFLEX) 2 MG capsule, Take 1 capsule (2 mg total) by mouth 3 (three) times daily as needed for muscle spasms. .  travoprost, benzalkonium, (TRAVATAN) 0.004 % ophthalmic solution, Place 1 drop into both eyes at bedtime. .  Vitamin D, Ergocalciferol, (DRISDOL) 50000 units CAPS capsule, Take 1 capsule (50,000 Units total) by mouth every 7 (seven)  days.    Past medical history, social, surgical and family history all reviewed in electronic medical record.  No pertanent information unless stated regarding to the chief complaint.   Review of Systems:  No headache, visual changes, nausea, vomiting, diarrhea, constipation, dizziness, abdominal pain, skin rash, fevers, chills, night sweats, weight loss, swollen lymph nodes,  chest pain, shortness of breath, mood changes.  Positive muscle aches, body aches  Objective  Blood pressure 118/80, pulse 78, height 6\' 2"  (1.88 m), weight 267 lb (121.1 kg), SpO2 97 %.    General: No apparent distress alert and oriented x3 mood and affect normal, dressed appropriately.  HEENT: Pupils equal, extraocular movements intact  Respiratory: Patient's speak in full sentences and does not appear short of breath  Cardiovascular: No lower extremity edema, non tender, no erythema  Skin: Warm dry intact with no signs of infection or rash on extremities or on axial skeleton.  Abdomen: Soft nontender  Neuro: Cranial nerves II through XII are intact, neurovascularly intact in all extremities with 2+ DTRs and 2+ pulses.  Lymph: No lymphadenopathy of posterior or anterior cervical chain or axillae bilaterally.  Gait normal with good balance and coordination.  MSK:  tender with full range of motion and good stability and symmetric strength and tone of shoulders, elbows, wrist, hip, knee and ankles bilaterally.  Low back exam shows that patient does have tightness from the thoracolumbar to the lumbosacral area bilaterally.  Lacks last 10 degrees of flexion in the last 15 degrees of extension at the moment today.  Tightness with Corky Sox test.  Patient is tender to palpation more diffusely in the musculature.  Osteopathic findings   T6 extended rotated and side bent left  L1 flexed rotated and side bent right L4 flexed rotated and side bent left Sacrum right on right    Impression and Recommendations:     This  case required medical decision making of moderate complexity. The above documentation has been reviewed and is accurate and complete Lyndal Pulley, DO       Note: This dictation was prepared with Dragon dictation along with smaller phrase technology. Any transcriptional errors that result from this process are unintentional.

## 2019-09-19 ENCOUNTER — Encounter: Payer: Self-pay | Admitting: Family Medicine

## 2019-09-19 NOTE — Assessment & Plan Note (Signed)
Decision today to treat with OMT was based on Physical Exam  After verbal consent patient was treated with HVLA, ME, FPR techniques in  thoracic, lumbar and sacral areas  Patient tolerated the procedure well with improvement in symptoms  Patient given exercises, stretches and lifestyle modifications  See medications in patient instructions if given  Patient will follow up in 4 weeks 

## 2019-09-19 NOTE — Assessment & Plan Note (Signed)
Continues to give patient some trouble.  Is on a low dose of Cymbalta, will consider possibly increasing in the future.  Discussed with patient about continuing to stay active.  Patient is to increase icing regimen, core strengthening, follow-up again 4 weeks

## 2019-09-28 DIAGNOSIS — H401133 Primary open-angle glaucoma, bilateral, severe stage: Secondary | ICD-10-CM | POA: Diagnosis not present

## 2019-10-16 ENCOUNTER — Other Ambulatory Visit: Payer: Self-pay

## 2019-10-16 ENCOUNTER — Ambulatory Visit (INDEPENDENT_AMBULATORY_CARE_PROVIDER_SITE_OTHER): Payer: Medicare Other | Admitting: Family Medicine

## 2019-10-16 ENCOUNTER — Encounter: Payer: Self-pay | Admitting: Family Medicine

## 2019-10-16 VITALS — BP 122/90 | HR 88 | Ht 74.0 in | Wt 270.0 lb

## 2019-10-16 DIAGNOSIS — S76312A Strain of muscle, fascia and tendon of the posterior muscle group at thigh level, left thigh, initial encounter: Secondary | ICD-10-CM

## 2019-10-16 DIAGNOSIS — G8929 Other chronic pain: Secondary | ICD-10-CM | POA: Diagnosis not present

## 2019-10-16 DIAGNOSIS — M545 Low back pain, unspecified: Secondary | ICD-10-CM

## 2019-10-16 DIAGNOSIS — M999 Biomechanical lesion, unspecified: Secondary | ICD-10-CM | POA: Diagnosis not present

## 2019-10-16 NOTE — Assessment & Plan Note (Signed)
Decision today to treat with OMT was based on Physical Exam  After verbal consent patient was treated with HVLA, ME, FPR techniques in  thoracic,  lumbar and sacral areas  Patient tolerated the procedure well with improvement in symptoms  Patient given exercises, stretches and lifestyle modifications  See medications in patient instructions if given  Patient will follow up in 4-8 weeks chronic problem that is stable

## 2019-10-16 NOTE — Assessment & Plan Note (Signed)
Chronic problem stable.  Discussed with patient about core strength and stability.  Icing regimen and home exercises, relaxer for breakthrough.  Discussed medical management.  Also determinants of health including patient's other comorbidities including being legally blind

## 2019-10-16 NOTE — Progress Notes (Signed)
[facility] [address] [place] Phone: [phone] Subjective:   I [name] am serving as a Education administrator for Dr. Hulan Saas.   This visit occurred during the SARS-CoV-2 public health emergency.  Safety protocols were in place, including screening questions prior to the visit, additional usage of staff PPE, and extensive cleaning of exam room while observing appropriate contact time as indicated for disinfecting solutions.   I'm seeing this patient by the request  of:  Patient, No Pcp Per  CC: Low back pain  [id number]  Bryce L Bora Sr. is a 44 y.o. male coming in with complaint of back pain. Last seen on 09/18/2019 for OMT. Left knee pain. Medial knee pain. Patient is wearing a knee brace. Remembers hurting it a long time ago but the pain hasn't bothered him until now.   Onset- 2 weeks ago (knee) Location - medial, MCL   Duration-  Character- sharp  Aggravating factors- extension, twisting  Severity of pain at 5 out of 10.  Concerned that it is more of a meniscal tear again     Past Medical History:  Diagnosis Date  . Allergy   . Cataract   . Fibromyalgia   . Glaucoma    Past Surgical History:  Procedure Laterality Date  . EYE SURGERY    . HERNIA REPAIR     Social History   Socioeconomic History  . Marital status: Married    Spouse name: Not on file  . Number of children: Not on file  . Years of education: Not on file  . Highest education level: Not on file  Occupational History  . Not on file  Tobacco Use  . Smoking status: Never Smoker  . Smokeless tobacco: Never Used  Substance and Sexual Activity  . Alcohol use: No  . Drug use: No  . Sexual activity: Not on file  Other Topics Concern  . Not on file  Social History Narrative  . Not on file   Social Determinants of Health   Financial Resource Strain:   . Difficulty of Paying Living Expenses: Not on file  Food Insecurity:   . Worried About  Charity fundraiser in the Last Year: Not on file  . Ran Out of Food in the Last Year: Not on file  Transportation Needs:   . Lack of Transportation (Medical): Not on file  . Lack of Transportation (Non-Medical): Not on file  Physical Activity:   . Days of Exercise per Week: Not on file  . Minutes of Exercise per Session: Not on file  Stress:   . Feeling of Stress : Not on file  Social Connections:   . Frequency of Communication with Friends and Family: Not on file  . Frequency of Social Gatherings with Friends and Family: Not on file  . Attends Religious Services: Not on file  . Active Member of Clubs or Organizations: Not on file  . Attends Archivist Meetings: Not on file  . Marital Status: Not on file   Allergies  Allergen Reactions  . Citrus Anaphylaxis  . Latex Rash   Family History  Problem Relation Age of Onset  . Diabetes Mother   . Hypertension Mother   . Hyperlipidemia Father   . Hypertension Father   . Stroke Father   . Hypertension Brother   . Cancer Maternal Grandmother   . Cancer Paternal Grandfather          Current Outpatient Medications (  Other):  .  amoxicillin (AMOXIL) 500 MG capsule, Take 1 capsule (500 mg total) 2 (two) times daily by mouth. [name]  azithromycin (ZITHROMAX) 500 MG tablet, Take 1 tablet (500 mg total) by mouth daily. .  brimonidine (ALPHAGAN) 0.2 % ophthalmic solution, Place 1 drop into both eyes 2 (two) times daily.  .  dorzolamide-timolol (COSOPT) 22.3-6.8 MG/ML ophthalmic solution, Place 1 drop into both eyes 2 (two) times daily. .  DULoxetine (CYMBALTA) 20 MG capsule, Take 2 capsules (40 mg total) by mouth daily. [name]  gabapentin (NEURONTIN) 300 MG capsule, Take 1 capsule (300 mg total) by mouth 3 (three) times daily. .  tizanidine (ZANAFLEX) 2 MG capsule, Take 1 capsule (2 mg total) by mouth 3 (three) times daily as needed for muscle spasms. .  travoprost, benzalkonium, (TRAVATAN) 0.004 % ophthalmic solution, Place 1 drop  into both eyes at bedtime. .  Vitamin D, Ergocalciferol, (DRISDOL) 50000 units CAPS capsule, Take 1 capsule (50,000 Units total) by mouth every 7 (seven) days.   Reviewed prior external information including notes and imaging from  primary care provider As well as notes that were available from care everywhere and other healthcare systems.  Past medical history, social, surgical and family history all reviewed in electronic medical record.  No pertanent information unless stated regarding to the chief complaint.   Review of Systems:  No  visual changes, nausea, vomiting, diarrhea, constipation, dizziness, abdominal pain, skin rash, fevers, chills, night sweats, weight loss, swollen lymph nodes,  joint swelling, chest pain, shortness of breath, mood changes. POSITIVE muscle aches, body aches, headache  Objective  Blood pressure 122/90, pulse 88, height 6\' 2"  (1.88 m), weight 270 lb (122.5 kg), SpO2 96 %.   General: No apparent distress alert and oriented x3 mood and affect normal, dressed appropriately.  Respiratory: Patient's speak in full sentences and does not appear short of breath  Cardiovascular: No lower extremity edema, non tender, no erythema  Skin: Warm dry intact with no signs of infection or rash on extremities or on axial skeleton.  Abdomen: Soft nontender  Neuro: Cranial nerves II through XII are intact, neurovascularly intact in all extremities with 2+ DTRs and 2+ pulses.  Lymph: No lymphadenopathy of posterior or anterior cervical chain or axillae bilaterally.  Gait normal with good balance and coordination.  MSK:    Left knee exam does show some arthritic changes of the medial joint space.  Tender to palpation over the pes anserine area.  Tightness with the hamstring and increasing discomfort and pain with resisted knee flexion when fully extended.  Back Exam:  Inspection: Mild loss of lordosis with patient having poor core strength Motion: Flexion 45 deg, Extension 25  deg, Side Bending to 40 deg bilaterally,  Rotation to 40 deg bilaterally   SLR laying: Negative  XSLR laying: Negative  Palpable tenderness: Tender to palpation in paraspinal musculature lumbar spine right greater than left. FABER: positive right . Sensory change: Gross sensation intact to all lumbar and sacral dermatomes.  Reflexes: 2+ at both patellar tendons, 2+ at achilles tendons, Babinski's downgoing.  Strength at foot  Plantar-flexion: 5/5 Dorsi-flexion: 5/5 Eversion: 5/5 Inversion: 5/5  Leg strength  Quad: 5/5 Hamstring: 5/5 Hip flexor: 5/5 Hip abductors: 5/5   Osteopathic findings T6 extended rotated and side bent right L2 flexed rotated and side bent right Sacrum left on left    Impression and Recommendations:     This case required medical decision making of moderate complexity. The above documentation has been  reviewed and is accurate and complete Lyndal Pulley, DO       Note: This dictation was prepared with Dragon dictation along with smaller phrase technology. Any transcriptional errors that result from this process are unintentional.

## 2019-10-16 NOTE — Patient Instructions (Signed)
Good to see you Thigh compression sleeve Careful going down hills or stairs See me again in 4 weeks

## 2019-10-16 NOTE — Assessment & Plan Note (Signed)
Left hamstring strain with what appears to be more of a Pez anserine tendinitis.  Discussed with patient icing regimen, home exercises, which activities to do which wants to avoid.  Patient is to increase activity slowly.  Patient will do more for compression sleeve.

## 2019-10-30 ENCOUNTER — Telehealth: Payer: Self-pay | Admitting: Internal Medicine

## 2019-10-30 NOTE — Telephone Encounter (Signed)
    Patient calling to request appointment to re-establish care.  Please advise

## 2019-10-31 ENCOUNTER — Encounter: Payer: Self-pay | Admitting: Emergency Medicine

## 2019-10-31 ENCOUNTER — Ambulatory Visit
Admission: EM | Admit: 2019-10-31 | Discharge: 2019-10-31 | Disposition: A | Discharge status: Home / Self Care | Payer: Medicare Other | Attending: Emergency Medicine | Admitting: Emergency Medicine

## 2019-10-31 ENCOUNTER — Other Ambulatory Visit: Payer: Self-pay

## 2019-10-31 DIAGNOSIS — Z20822 Contact with and (suspected) exposure to covid-19: Secondary | ICD-10-CM | POA: Diagnosis not present

## 2019-10-31 DIAGNOSIS — R05 Cough: Secondary | ICD-10-CM

## 2019-10-31 DIAGNOSIS — R059 Cough, unspecified: Secondary | ICD-10-CM

## 2019-10-31 MED ORDER — BENZONATATE 100 MG PO CAPS: 100.0000 mg | ORAL_CAPSULE | Freq: Three times a day (TID) | ORAL | 0 refills | Status: DC

## 2019-10-31 MED ORDER — PROMETHAZINE-DM 6.25-15 MG/5ML PO SYRP: 5.0000 mL | ORAL_SOLUTION | Freq: Four times a day (QID) | ORAL | 0 refills | Status: DC | PRN

## 2019-10-31 NOTE — Discharge Instructions (Addendum)
Tessalon for cough. Atrovent nasal spray for nasal congestion/drainage. You can use over the counter nasal saline rinse such as neti pot for nasal congestion. Keep hydrated, your urine should be clear to pale yellow in color. Tylenol/motrin for fever and pain. Monitor for any worsening of symptoms, chest pain, shortness of breath, wheezing, swelling of the throat, go to the emergency department for further evaluation needed.

## 2019-10-31 NOTE — Telephone Encounter (Signed)
Unable to at this time to accept new patients sorry.

## 2019-10-31 NOTE — ED Notes (Signed)
Patient able to ambulate independently  

## 2019-10-31 NOTE — ED Triage Notes (Signed)
Pt presents to [facility] for assessment of non-productive cough x 1 week.  Pt is concerned about the shunts to his eyes for his glaucoma may be causing symptoms.  Pt denies nasal congestion, denies sore throat, denies n/v/d.

## 2019-10-31 NOTE — ED Provider Notes (Signed)
[facility]    CSN: [id number] Arrival date & time: 10/31/19  0941      History   Chief Complaint Chief Complaint  Patient presents with  . Cough    HPI Bryce KARAPETIAN Sr. is a 44 y.o. male with history of cataracts, glaucoma, allergies presenting for nonproductive cough for the last week.  Patient states that he has been compliant with his allergy medications: Cannot take Flonase due to his glaucoma.  Patient has bilateral shunt placement, followed recently by [facility] for glaucoma.  Denies eye pain, photophobia, visual changes.  No fever, ear pain, sore throat, difficulty breathing or swallowing, chest pain or palpitations.  No known sick contacts.  No recent change in medications.   Past Medical History:  Diagnosis Date  . Allergy   . Cataract   . Fibromyalgia   . Glaucoma     Patient Active Problem List   Diagnosis Date Noted  . Left hamstring muscle strain 10/16/2019  . Acute meniscal tear of knee, left, initial encounter 05/16/2019  . Ganglion cyst of finger of left hand 07/26/2018  . Finger sprain 05/19/2018  . Back pain 10/04/2017  . Nonallopathic lesion of lumbosacral region 10/04/2017  . Nonallopathic lesion of sacral region 10/04/2017  . Nonallopathic lesion of thoracic region 10/04/2017  . Patellofemoral arthritis of right knee 02/24/2016  . Degenerative arthritis of knee 02/24/2016  . Acute medial meniscal tear 11/21/2015  . Right knee pain 11/08/2015  . Pain of right heel 11/08/2015  . Right ear pain 04/23/2015  . Fibromyalgia 03/15/2015  . Glaucoma 03/15/2015  . Legally blind 03/15/2015    Past Surgical History:  Procedure Laterality Date  . EYE SURGERY    . HERNIA REPAIR         Home Medications    Prior to Admission medications   Medication Sig Start Date End Date Taking? Authorizing Provider  brimonidine (ALPHAGAN) 0.2 % ophthalmic solution Place 1 drop into both eyes 2 (two) times daily.     [provider]    dorzolamide-timolol (COSOPT) 22.3-6.8 MG/ML ophthalmic solution Place 1 drop into both eyes 2 (two) times daily.    [provider]  DULoxetine (CYMBALTA) 20 MG capsule Take 2 capsules (40 mg total) by mouth daily. 10/04/17   Lyndal Pulley, DO  gabapentin (NEURONTIN) 300 MG capsule Take 1 capsule (300 mg total) by mouth 3 (three) times daily. 03/14/15   Hoyt Koch, MD  promethazine-dextromethorphan (PROMETHAZINE-DM) 6.25-15 MG/5ML syrup Take 5 mLs by mouth 4 (four) times daily as needed for cough. 10/31/19   Hall-Potvin, [place], PA-C  travoprost, benzalkonium, (TRAVATAN) 0.004 % ophthalmic solution Place 1 drop into both eyes at bedtime.    [provider]  Vitamin D, Ergocalciferol, (DRISDOL) 50000 units CAPS capsule Take 1 capsule (50,000 Units total) by mouth every 7 (seven) days. 06/23/18   Lyndal Pulley, DO    Family History Family History  Problem Relation Age of Onset  . Diabetes Mother   . Hypertension Mother   . Hyperlipidemia Father   . Hypertension Father   . Stroke Father   . Hypertension Brother   . Cancer Maternal Grandmother   . Cancer Paternal Grandfather     Social History Social History   Tobacco Use  . Smoking status: Never Smoker  . Smokeless tobacco: Never Used  Substance Use Topics  . Alcohol use: No  . Drug use: No     Allergies   Citrus and Latex  Review of Systems As per HPI   Physical Exam Triage Vital Signs ED Triage Vitals  Enc Vitals Group     BP      Pulse      Resp      Temp      Temp src      SpO2      Weight      Height      Head Circumference      Peak Flow      Pain Score      Pain Loc      Pain Edu?      Excl. in GC?    No data found.  Updated Vital Signs BP 130/87 (BP Location: Left Arm)   Pulse 93   Temp 97.8 F (36.6 C) (Temporal)   Resp 18   SpO2 95%   Visual Acuity Right Eye Distance:   Left Eye Distance:   Bilateral Distance:    Right Eye Near:   Left Eye Near:     Bilateral Near:     Physical Exam Constitutional:      General: He is not in acute distress.    Appearance: He is obese. He is not ill-appearing, toxic-appearing or diaphoretic.  HENT:     Head: Normocephalic and atraumatic.     Right Ear: Tympanic membrane, ear canal and external ear normal.     Left Ear: Tympanic membrane, ear canal and external ear normal.     Nose: Nose normal.     Mouth/Throat:     Mouth: Mucous membranes are moist.     Pharynx: Oropharynx is clear.  Eyes:     General: No scleral icterus.       Right eye: No discharge.        Left eye: No discharge.     Extraocular Movements: Extraocular movements intact.     Conjunctiva/sclera: Conjunctivae normal.     Comments: Pupils are irregular (chronic/stable per pt), shunts visualized bilaterally.  No anterior chamber cloudiness  Neck:     Comments: Trachea midline, negative JVD Cardiovascular:     Rate and Rhythm: Normal rate and regular rhythm.  Pulmonary:     Effort: Pulmonary effort is normal. No respiratory distress.     Breath sounds: No wheezing, rhonchi or rales.  Musculoskeletal:     Cervical back: Neck supple. No tenderness.  Lymphadenopathy:     Cervical: No cervical adenopathy.  Skin:    Capillary Refill: Capillary refill takes less than 2 seconds.     Coloration: Skin is not jaundiced or pale.     Findings: No rash.  Neurological:     Mental Status: He is alert and oriented to person, place, and time.      UC Treatments / Results  Labs (all labs ordered are listed, but only abnormal results are displayed) Labs Reviewed  NOVEL CORONAVIRUS, NAA    EKG   Radiology No results found.  Procedures Procedures (including critical care time)  Medications Ordered in UC Medications - No data to display  Initial Impression / Assessment and Plan / UC Course  I have reviewed the triage vital signs and the nursing notes.  Pertinent labs & imaging results that were available during my care of  the patient were reviewed by me and considered in my medical decision making (see chart for details).     Patient afebrile, nontoxic, with SpO2 95%.  Likely related to environmental allergies/weather changes.  Low concern for bronchitis or pneumonia  at this time.  Will avoid intranasal steroid given history of glaucoma.  Offered benzonatate for cough: Patient states he does better with Promethazine DM.  Prescription sent.  Covid PCR pending.  Patient to quarantine until results are back.  We will continue supportive management.  Return precautions discussed, patient verbalized understanding and is agreeable to plan. Final Clinical Impressions(s) / UC Diagnoses   Final diagnoses:  Cough     Discharge Instructions     Tessalon for cough. Atrovent nasal spray for nasal congestion/drainage. You can use over the counter nasal saline rinse such as neti pot for nasal congestion. Keep hydrated, your urine should be clear to pale yellow in color. Tylenol/motrin for fever and pain. Monitor for any worsening of symptoms, chest pain, shortness of breath, wheezing, swelling of the throat, go to the emergency department for further evaluation needed.     ED Prescriptions    Medication Sig Dispense Auth. Provider   benzonatate (TESSALON) 100 MG capsule  (Status: Discontinued) Take 1 capsule (100 mg total) by mouth every 8 (eight) hours. 21 capsule Hall-Potvin, [place], PA-C   promethazine-dextromethorphan (PROMETHAZINE-DM) 6.25-15 MG/5ML syrup Take 5 mLs by mouth 4 (four) times daily as needed for cough. 118 mL Hall-Potvin, [place], PA-C     PDMP not reviewed this encounter.   Hall-Potvin, [place], [place] 10/31/19 1102

## 2019-11-01 NOTE — Telephone Encounter (Signed)
Patient made aware.

## 2019-11-02 LAB — NOVEL CORONAVIRUS, NAA: SARS-CoV-2, NAA: NOT DETECTED

## 2019-11-13 ENCOUNTER — Other Ambulatory Visit: Payer: Self-pay

## 2019-11-13 ENCOUNTER — Ambulatory Visit (INDEPENDENT_AMBULATORY_CARE_PROVIDER_SITE_OTHER): Payer: Medicare Other | Admitting: Family Medicine

## 2019-11-13 ENCOUNTER — Encounter: Payer: Self-pay | Admitting: Family Medicine

## 2019-11-13 VITALS — BP 136/84 | HR 90 | Ht 74.0 in | Wt 272.0 lb

## 2019-11-13 DIAGNOSIS — G8929 Other chronic pain: Secondary | ICD-10-CM

## 2019-11-13 DIAGNOSIS — M999 Biomechanical lesion, unspecified: Secondary | ICD-10-CM | POA: Diagnosis not present

## 2019-11-13 DIAGNOSIS — M545 Low back pain: Secondary | ICD-10-CM | POA: Diagnosis not present

## 2019-11-13 NOTE — Progress Notes (Signed)
[facility] [address] [place] Phone: [phone] Subjective:   I [name] am serving as a Education administrator for Dr. Hulan Saas.  This visit occurred during the SARS-CoV-2 public health emergency.  Safety protocols were in place, including screening questions prior to the visit, additional usage of staff PPE, and extensive cleaning of exam room while observing appropriate contact time as indicated for disinfecting solutions.   I'm seeing this patient by the request  of:  Patient, No Pcp Per  CC: neck and back pain.   [id number]  LIVINGSTON DENNER Sr. is a 44 y.o. male coming in with complaint of back pain. Last seen on 10/16/2019 for OMT. Patient states that he is tight today. No numbness, discuseed HEP  Patient has had some discomfort from time to time.  Nothing severe.  Still able to do most daily activities, still not working out on a regular basis low.      Past Medical History:  Diagnosis Date  . Allergy   . Cataract   . Fibromyalgia   . Glaucoma    Past Surgical History:  Procedure Laterality Date  . EYE SURGERY    . HERNIA REPAIR     Social History   Socioeconomic History  . Marital status: Married    Spouse name: Not on file  . Number of children: Not on file  . Years of education: Not on file  . Highest education level: Not on file  Occupational History  . Not on file  Tobacco Use  . Smoking status: Never Smoker  . Smokeless tobacco: Never Used  Substance and Sexual Activity  . Alcohol use: No  . Drug use: No  . Sexual activity: Not on file  Other Topics Concern  . Not on file  Social History Narrative  . Not on file   Social Determinants of Health   Financial Resource Strain:   . Difficulty of Paying Living Expenses: Not on file  Food Insecurity:   . Worried About Charity fundraiser in the Last Year: Not on file  . Ran Out of Food in the Last Year: Not on file  Transportation Needs:   . Lack of  Transportation (Medical): Not on file  . Lack of Transportation (Non-Medical): Not on file  Physical Activity:   . Days of Exercise per Week: Not on file  . Minutes of Exercise per Session: Not on file  Stress:   . Feeling of Stress : Not on file  Social Connections:   . Frequency of Communication with Friends and Family: Not on file  . Frequency of Social Gatherings with Friends and Family: Not on file  . Attends Religious Services: Not on file  . Active Member of Clubs or Organizations: Not on file  . Attends Archivist Meetings: Not on file  . Marital Status: Not on file   Allergies  Allergen Reactions  . Citrus Anaphylaxis  . Latex Rash   Family History  Problem Relation Age of Onset  . Diabetes Mother   . Hypertension Mother   . Hyperlipidemia Father   . Hypertension Father   . Stroke Father   . Hypertension Brother   . Cancer Maternal Grandmother   . Cancer Paternal Grandfather       Current Outpatient Medications (Respiratory):  .  promethazine-dextromethorphan (PROMETHAZINE-DM) 6.25-15 MG/5ML syrup, Take 5 mLs by mouth 4 (four) times daily as needed for cough.    Current Outpatient Medications (Other):  .  brimonidine (ALPHAGAN) 0.2 % ophthalmic solution, Place 1 drop into both eyes 2 (two) times daily.  .  dorzolamide-timolol (COSOPT) 22.3-6.8 MG/ML ophthalmic solution, Place 1 drop into both eyes 2 (two) times daily. .  DULoxetine (CYMBALTA) 20 MG capsule, Take 2 capsules (40 mg total) by mouth daily. [name]  gabapentin (NEURONTIN) 300 MG capsule, Take 1 capsule (300 mg total) by mouth 3 (three) times daily. .  travoprost, benzalkonium, (TRAVATAN) 0.004 % ophthalmic solution, Place 1 drop into both eyes at bedtime. .  Vitamin D, Ergocalciferol, (DRISDOL) 50000 units CAPS capsule, Take 1 capsule (50,000 Units total) by mouth every 7 (seven) days.   Reviewed prior external information including notes and imaging from  primary care provider As well as  notes that were available from care everywhere and other healthcare systems.  Past medical history, social, surgical and family history all reviewed in electronic medical record.  No pertanent information unless stated regarding to the chief complaint.   Review of Systems:  No headache, visual changes, nausea, vomiting, diarrhea, constipation, dizziness, abdominal pain, skin rash, fevers, chills, night sweats, weight loss, swollen lymph nodes, body aches, joint swelling, chest pain, shortness of breath, mood changes. POSITIVE muscle aches  Objective  Blood pressure 136/84, pulse 90, height 6\' 2"  (1.88 m), weight 272 lb (123.4 kg), SpO2 98 %.   General: No apparent distress alert and oriented x3 mood and affect normal, dressed appropriately.  HEENT: Pupils equal, extraocular movements intact  Respiratory: Patient's speak in full sentences and does not appear short of breath  Cardiovascular: No lower extremity edema, non tender, no erythema  Skin: Warm dry intact with no signs of infection or rash on extremities or on axial skeleton.  Abdomen: Soft nontender  Neuro: Cranial nerves II through XII are intact, neurovascularly intact in all extremities with 2+ DTRs and 2+ pulses.  Lymph: No lymphadenopathy of posterior or anterior cervical chain or axillae bilaterally.  Gait normal with good balance and coordination.  MSK:  tender with full range of motion and good stability and symmetric strength and tone of shoulders, elbows, wrist, hip, knee and ankles bilaterally.  Back Exam:  Inspection: Loss of lordosis Motion: Flexion 45 deg, Extension 25 deg, Side Bending to 35 deg bilaterally,  Rotation to 45 deg bilaterally  SLR laying: Negative  XSLR laying: Negative  Palpable tenderness: Tender to palpation of paraspinal musculature lumbar spine right greater than left. FABER: negative. Sensory change: Gross sensation intact to all lumbar and sacral dermatomes.  Reflexes: 2+ at both patellar  tendons, 2+ at achilles tendons, Babinski's downgoing.  Strength at foot  Plantar-flexion: 5/5 Dorsi-flexion: 5/5 Eversion: 5/5 Inversion: 5/5  Leg strength  Quad: 5/5 Hamstring: 5/5 Hip flexor: 5/5 Hip abductors: 5/5  Gait unremarkable.  Osteopathic findings  T5 extended rotated and side bent left L2 flexed rotated and side bent right Sacrum right on right    Impression and Recommendations:     This case required medical decision making of moderate complexity. The above documentation has been reviewed and is accurate and complete , DO       Note: This dictation was prepared with Dragon dictation along with smaller phrase technology. Any transcriptional errors that result from this process are unintentional.

## 2019-11-13 NOTE — Patient Instructions (Signed)
[facility] [employer] See me again in 4-6 weeks

## 2019-11-13 NOTE — Assessment & Plan Note (Signed)
Chronic problem, multifactorial, patient does have underlying fibromyalgia but it seems to encounter exacerbate some of the problems as well.  Discussed home exercises and icing regimen, patient will increase activity slowly.  Follow-up again in 4 to 8 weeks

## 2019-11-13 NOTE — Assessment & Plan Note (Signed)
Decision today to treat with OMT was based on Physical Exam  After verbal consent patient was treated with HVLA, ME, FPR techniques in  thoracic, lumbar and sacral areas  Patient tolerated the procedure well with improvement in symptoms  Patient given exercises, stretches and lifestyle modifications  See medications in patient instructions if given  Patient will follow up in 4-8 weeks 

## 2019-12-11 ENCOUNTER — Other Ambulatory Visit: Payer: Self-pay

## 2019-12-11 ENCOUNTER — Ambulatory Visit (INDEPENDENT_AMBULATORY_CARE_PROVIDER_SITE_OTHER): Payer: Medicare Other | Admitting: Family Medicine

## 2019-12-11 ENCOUNTER — Encounter: Payer: Self-pay | Admitting: Family Medicine

## 2019-12-11 VITALS — BP 144/90 | HR 89 | Ht 74.0 in | Wt 274.0 lb

## 2019-12-11 DIAGNOSIS — G8929 Other chronic pain: Secondary | ICD-10-CM

## 2019-12-11 DIAGNOSIS — M545 Low back pain: Secondary | ICD-10-CM | POA: Diagnosis not present

## 2019-12-11 DIAGNOSIS — M999 Biomechanical lesion, unspecified: Secondary | ICD-10-CM

## 2019-12-11 NOTE — Patient Instructions (Signed)
See me again in 5-6 weeks 

## 2019-12-11 NOTE — Progress Notes (Signed)
[name] [address] [place] [address] Phone: [phone] Subjective:   I [name] am serving as a Neurosurgeon for Dr. Antoine Primas.  This visit occurred during the SARS-CoV-2 public health emergency.  Safety protocols were in place, including screening questions prior to the visit, additional usage of staff PPE, and extensive cleaning of exam room while observing appropriate contact time as indicated for disinfecting solutions.   I'm seeing this patient by the request  of:  Patient, No Pcp Per  CC: Neck pain, back pain,  [id number]  Bryce L Cordoba Sr. is a 44 y.o. male coming in with complaint of back pain. Last seen 11/13/2019 for OMT. Patient feels about the same.  Patient states no worse.  Still having some mild discomfort and pain overall.  Discussed which activities to do which she has been doing some of the exercises.     Past Medical History:  Diagnosis Date  . Allergy   . Cataract   . Fibromyalgia   . Glaucoma    Past Surgical History:  Procedure Laterality Date  . EYE SURGERY    . HERNIA REPAIR     Social History   Socioeconomic History  . Marital status: Married    Spouse name: Not on file  . Number of children: Not on file  . Years of education: Not on file  . Highest education level: Not on file  Occupational History  . Not on file  Tobacco Use  . Smoking status: Never Smoker  . Smokeless tobacco: Never Used  Substance and Sexual Activity  . Alcohol use: No  . Drug use: No  . Sexual activity: Not on file  Other Topics Concern  . Not on file  Social History Narrative  . Not on file   Social Determinants of Health   Financial Resource Strain:   . Difficulty of Paying Living Expenses:   Food Insecurity:   . Worried About Programme researcher, broadcasting/film/video in the Last Year:   . Barista in the Last Year:   Transportation Needs:   . Freight forwarder (Medical):   [name] Lack of Transportation (Non-Medical):    Physical Activity:   . Days of Exercise per Week:   . Minutes of Exercise per Session:   Stress:   . Feeling of Stress :   Social Connections:   . Frequency of Communication with Friends and Family:   . Frequency of Social Gatherings with Friends and Family:   . Attends Religious Services:   . Active Member of Clubs or Organizations:   . Attends Banker Meetings:   [name] Marital Status:    Allergies  Allergen Reactions  . Citrus Anaphylaxis  . Latex Rash   Family History  Problem Relation Age of Onset  . Diabetes Mother   . Hypertension Mother   . Hyperlipidemia Father   . Hypertension Father   . Stroke Father   . Hypertension Brother   . Cancer Maternal Grandmother   . Cancer Paternal Grandfather       Current Outpatient Medications (Respiratory):  .  promethazine-dextromethorphan (PROMETHAZINE-DM) 6.25-15 MG/5ML syrup, Take 5 mLs by mouth 4 (four) times daily as needed for cough.    Current Outpatient Medications (Other):  .  brimonidine (ALPHAGAN) 0.2 % ophthalmic solution, Place 1 drop into both eyes 2 (two) times daily.  .  dorzolamide-timolol (COSOPT) 22.3-6.8 MG/ML ophthalmic solution, Place 1 drop into both eyes 2 (two) times daily. [name]  DULoxetine (CYMBALTA) 20 MG capsule, Take 2 capsules (40 mg total) by mouth daily. [name]  gabapentin (NEURONTIN) 300 MG capsule, Take 1 capsule (300 mg total) by mouth 3 (three) times daily. .  travoprost, benzalkonium, (TRAVATAN) 0.004 % ophthalmic solution, Place 1 drop into both eyes at bedtime. .  Vitamin D, Ergocalciferol, (DRISDOL) 50000 units CAPS capsule, Take 1 capsule (50,000 Units total) by mouth every 7 (seven) days.   Reviewed prior external information including notes and imaging from  primary care provider As well as notes that were available from care everywhere and other healthcare systems.  Past medical history, social, surgical and family history all reviewed in electronic medical record.  No pertanent  information unless stated regarding to the chief complaint.   Review of Systems:  No headache, visual changes, nausea, vomiting, diarrhea, constipation, dizziness, abdominal pain, skin rash, fevers, chills, night sweats, weight loss, swollen lymph nodes, body aches, joint swelling, chest pain, shortness of breath, mood changes. POSITIVE muscle aches  Objective  Blood pressure (!) 144/90, pulse 89, height 6\' 2"  (1.88 m), weight 274 lb (124.3 kg), SpO2 97 %.   General: No apparent distress alert and oriented x3 mood and affect normal, dressed appropriately.  HEENT: Pupils equal, extraocular movements intact  Respiratory: Patient's speak in full sentences and does not appear short of breath  Cardiovascular: No lower extremity edema, non tender, no erythema  Neuro: Cranial nerves II through XII are intact, neurovascularly intact in all extremities with 2+ DTRs and 2+ pulses.  Gait normal with good balance and coordination.  MSK:  Non tender with full range of motion and good stability and symmetric strength and tone of shoulders, elbows, wrist, hip, knee and ankles bilaterally.  Neck exam does show some mild loss of lordosis.  Patient does have scarring on the posterior aspect of the neck secondary to previous surgery.  Negative Spurling's.  Tightness in the parascapular region.  No spinous process tenderness.  Low back exam shows some tenderness to palpation of the lumbosacral area right greater than left.  Tightness with Corky Sox.  Osteopathic findings  T8 extended rotated and side bent left L2 flexed rotated and side bent right Sacrum right on right    Impression and Recommendations:     This case required medical decision making of moderate complexity. The above documentation has been reviewed and is accurate and complete Lyndal Pulley, DO       Note: This dictation was prepared with Dragon dictation along with smaller phrase technology. Any transcriptional errors that result from  this process are unintentional.

## 2019-12-11 NOTE — Assessment & Plan Note (Signed)

## 2019-12-11 NOTE — Assessment & Plan Note (Addendum)
Chronic problem with exacerbation.  Discussed which activities to do which wants to avoid.  Patient is to increase activity slowly over the course the next several days.  Discussed icing regimen and home exercises, increase slowly.  Follow-up again in 4 to 8 weeksDiscussed medication management.  Continue on the Cymbalta of 40 mg and the gabapentin 3 times a day

## 2019-12-25 ENCOUNTER — Ambulatory Visit: Payer: Medicare Other | Admitting: Family Medicine

## 2019-12-28 ENCOUNTER — Ambulatory Visit
Admission: EM | Admit: 2019-12-28 | Discharge: 2019-12-28 | Disposition: A | Discharge status: Home / Self Care | Payer: Medicare Other | Attending: Emergency Medicine | Admitting: Emergency Medicine

## 2019-12-28 DIAGNOSIS — M545 Low back pain, unspecified: Secondary | ICD-10-CM

## 2019-12-28 MED ORDER — CYCLOBENZAPRINE HCL 5 MG PO TABS: 5.0000 mg | ORAL_TABLET | Freq: Two times a day (BID) | ORAL | 0 refills | Status: DC | PRN

## 2019-12-28 MED ORDER — NAPROXEN 500 MG PO TABS: 500.0000 mg | ORAL_TABLET | Freq: Two times a day (BID) | ORAL | 0 refills | Status: AC

## 2019-12-28 NOTE — Discharge Instructions (Signed)
Recommend RICE: rest, ice, compression, elevation as needed for pain.    Heat therapy (hot compress, warm wash rag, hot showers, etc.) can help relax muscles and soothe muscle aches. Cold therapy (ice packs) can be used to help swelling both after injury and after prolonged use of areas of chronic pain/aches.  For pain: recommend 350 mg-1000 mg of Tylenol (acetaminophen) and/or 200 mg - 800 mg of Advil (ibuprofen, Motrin) every 8 hours as needed.  May alternate between the two throughout the day as they are generally safe to take together.  DO NOT exceed more than 3000 mg of Tylenol or 3200 mg of ibuprofen in a 24 hour period as this could damage your stomach, kidneys, liver, or increase your bleeding risk.  May take muscle relaxer as needed for severe pain / spasm.  (This medication may cause you to become tired so it is important you do not drink alcohol or operate heavy machinery while on this medication.  Recommend your first dose to be taken before bedtime to monitor for side effects safely)  Return for  
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
## 2020-01-03 ENCOUNTER — Other Ambulatory Visit: Payer: Self-pay

## 2020-01-03 ENCOUNTER — Ambulatory Visit (INDEPENDENT_AMBULATORY_CARE_PROVIDER_SITE_OTHER): Payer: Medicare Other

## 2020-01-03 ENCOUNTER — Ambulatory Visit (INDEPENDENT_AMBULATORY_CARE_PROVIDER_SITE_OTHER): Payer: Medicare Other | Admitting: Family Medicine

## 2020-01-03 ENCOUNTER — Encounter: Payer: Self-pay | Admitting: Family Medicine

## 2020-01-03 VITALS — BP 112/80 | HR 78 | Ht 74.0 in | Wt 276.0 lb

## 2020-01-03 DIAGNOSIS — S3992XA Unspecified injury of lower back, initial encounter: Secondary | ICD-10-CM | POA: Diagnosis not present

## 2020-01-03 DIAGNOSIS — M545 Low back pain, unspecified: Secondary | ICD-10-CM

## 2020-01-03 DIAGNOSIS — M542 Cervicalgia: Secondary | ICD-10-CM | POA: Diagnosis not present

## 2020-01-03 DIAGNOSIS — S199XXA Unspecified injury of neck, initial encounter: Secondary | ICD-10-CM | POA: Diagnosis not present

## 2020-01-03 DIAGNOSIS — M999 Biomechanical lesion, unspecified: Secondary | ICD-10-CM

## 2020-01-03 MED ORDER — KETOROLAC TROMETHAMINE 60 MG/2ML IM SOLN
60.0000 mg | Freq: Once | INTRAMUSCULAR | Status: AC
  Administered 2020-01-03: 60 mg via INTRAMUSCULAR
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
## 2020-01-03 NOTE — Assessment & Plan Note (Signed)
   Decision today to treat with OMT was based on Physical Exam  After verbal consent patient was treated with HVLA, ME, FPR techniques in cervical, thoracic,  lumbar and sacral areas, all areas are chronic   Patient tolerated the procedure well with improvement in symptoms  Patient given exercises, stretches and lifestyle modifications  See medications in patient instructions if given  Patient will follow up in 4-8 weeks 

## 2020-01-03 NOTE — Patient Instructions (Addendum)
Xray today Injections in back side Muscle relaxer at night Keep other appointment

## 2020-01-08 DIAGNOSIS — H401133 Primary open-angle glaucoma, bilateral, severe stage: Secondary | ICD-10-CM | POA: Diagnosis not present

## 2020-01-23 ENCOUNTER — Other Ambulatory Visit: Payer: Self-pay

## 2020-01-23 ENCOUNTER — Ambulatory Visit (INDEPENDENT_AMBULATORY_CARE_PROVIDER_SITE_OTHER): Payer: Medicare Other | Admitting: Internal Medicine

## 2020-01-23 ENCOUNTER — Encounter: Payer: Self-pay | Admitting: Internal Medicine

## 2020-01-23 VITALS — BP 130/90 | HR 94 | Temp 97.7°F | Ht 74.5 in | Wt 274.0 lb

## 2020-01-23 DIAGNOSIS — E669 Obesity, unspecified: Secondary | ICD-10-CM | POA: Diagnosis not present

## 2020-01-23 DIAGNOSIS — H548 Legal blindness, as defined in USA: Secondary | ICD-10-CM

## 2020-01-23 DIAGNOSIS — R140 Abdominal distension (gaseous): Secondary | ICD-10-CM | POA: Diagnosis not present

## 2020-01-23 DIAGNOSIS — M797 Fibromyalgia: Secondary | ICD-10-CM

## 2020-01-23 DIAGNOSIS — R030 Elevated blood-pressure reading, without diagnosis of hypertension: Secondary | ICD-10-CM

## 2020-01-23 NOTE — Patient Instructions (Signed)
-Nice seeing you today!!  -Lab work today; will notify you once results are available.  -Please schedule follow up for your physical in 3 months. Come in fasting that day.   Gluten-Free Diet for Celiac Disease, Adult  The gluten-free diet includes all foods that do not contain gluten. Gluten is a protein that is found in wheat, rye, barley, and some other grains. Following the gluten-free diet is the only treatment for people with celiac disease. It helps to prevent damage to the intestines and improves or eliminates the symptoms of celiac disease. Following the gluten-free diet requires some planning. It can be challenging at first, but it gets easier with time and practice. There are more gluten-free options available today than ever before. If you need help finding gluten-free foods or if you have questions, talk with your diet and nutrition specialist (registered dietitian) or your health care provider. What do I need to know about a gluten-free diet?  All fruits, vegetables, and meats are safe to eat and do not contain gluten.  When grocery shopping, start by shopping in the produce, meat, and dairy sections. These sections are more likely to contain gluten-free foods. Then move to the aisles that contain packaged foods if you need to.  Read all food labels. Gluten is often added to foods. Always check the ingredient list and look for warnings, such as "may contain gluten."  Talk with your dietitian or health care provider before taking a gluten-free multivitamin or mineral supplement.  Be aware of gluten-free foods having contact with foods that contain gluten (cross-contamination). This can happen at home and with any processed foods. ? Talk with your health care provider or dietitian about how to reduce the risk of cross-contamination in your home. ? If you have questions about how a food is processed, ask the manufacturer. What key words help to identify gluten? Foods that list any  of these key words on the label usually contain gluten:  Wheat, flour, enriched flour, bromated flour, white flour, durum flour, graham flour, phosphated flour, self-rising flour, semolina, farina, barley (malt), rye, and oats.  Starch, dextrin, modified food starch, or cereal.  Thickening, fillers, or emulsifiers.  Malt flavoring, malt extract, or malt syrup.  Hydrolyzed vegetable protein. In the [place] packaged foods that are gluten-free are required to be labeled "GF." These foods should be easy to identify and are safe to eat. In the [place] food companies are also required to list common food allergens, including wheat, on their labels. Recommended foods Grains  Amaranth, bean flours, 100% buckwheat flour, corn, millet, nut flours or nut meals, GF oats, quinoa, rice, sorghum, teff, rice wafers, pure cornmeal tortillas, popcorn, and hot cereals made from cornmeal. Hominy, rice, wild rice. Some Asian rice noodles or bean noodles. Arrowroot starch, corn bran, corn flour, corn germ, cornmeal, corn starch, potato flour, potato starch flour, and rice bran. Plain, brown, and sweet rice flours. Rice polish, soy flour, and tapioca starch. Vegetables  All plain fresh, frozen, and canned vegetables. Fruits  All plain fresh, frozen, canned, and dried fruits, and 100% fruit juices. Meats and other protein foods  All fresh beef, pork, poultry, fish, seafood, and eggs. Fish canned in water, oil, brine, or vegetable broth. Plain nuts and seeds, peanut butter. Some lunch meat and some frankfurters. Dried beans, dried peas, and lentils. Dairy  Fresh plain, dry, evaporated, or condensed milk. Cream, butter, sour cream, whipping cream, and most yogurts. Unprocessed cheese, most processed cheeses, some cottage cheese, some cream  cheeses. Beverages  Coffee, tea, most herbal teas. Carbonated beverages and some root beers. Wine, sake, and distilled spirits, such as gin, vodka, and whiskey. Most hard  ciders. Fats and oils  Butter, margarine, vegetable oil, hydrogenated butter, olive oil, shortening, lard, cream, and some mayonnaise. Some commercial salad dressings. Olives. Sweets and desserts  Sugar, honey, some syrups, molasses, jelly, and jam. Plain hard candy, marshmallows, and gumdrops. Pure cocoa powder. Plain chocolate. Custard and some pudding mixes. Gelatin desserts, sorbets, frozen ice pops, and sherbet. Cake, cookies, and other desserts prepared with allowed flours. Some commercial ice creams. Cornstarch, tapioca, and rice puddings. Seasoning and other foods  Some canned or frozen soups. Monosodium glutamate (MSG). Cider, rice, and wine vinegar. Baking soda and baking powder. Cream of tartar. Baking and nutritional yeast. Certain soy sauces made without wheat (ask your dietitian about specific brands that are allowed). Nuts, coconut, and chocolate. Salt, pepper, herbs, spices, flavoring extracts, imitation or artificial flavorings, natural flavorings, and food colorings. Some medicines and supplements. Some lip glosses and other cosmetics. Rice syrups. The items listed may not be a complete list. Talk with your dietitian about what dietary choices are best for you. Foods to avoid Grains  Barley, bran, bulgur, couscous, cracked wheat, [place], farro, graham, malt, matzo, semolina, wheat germ, and all wheat and rye cereals including spelt and kamut. Cereals containing malt as a flavoring, such as rice cereal. Noodles, spaghetti, macaroni, most packaged rice mixes, and all mixes containing wheat, rye, barley, or triticale. Vegetables  Most creamed vegetables and most vegetables canned in sauces. Some commercially prepared vegetables and salads. Fruits  Thickened or prepared fruits and some pie fillings. Some fruit snacks and fruit roll-ups. Meats and other protein foods  Any meat or meat alternative containing wheat, rye, barley, or gluten stabilizers. These are often marinated or  packaged meats and lunch meats. Bread-containing products, such as Swiss steak, croquettes, meatballs, and meatloaf. Most tuna canned in vegetable broth and [place] with hydrolyzed vegetable protein (HVP) injected as part of the basting. Seitan. Imitation fish. Eggs in sauces made from ingredients to avoid. Dairy  Commercial chocolate milk drinks and malted milk. Some non-dairy creamers. Any cheese product containing ingredients to avoid. Beverages  Certain cereal beverages. Beer, ale, malted milk, and some root beers. Some hard ciders. Some instant flavored coffees. Some herbal teas made with barley or with barley malt added. Fats and oils  Some commercial salad dressings. Sour cream containing modified food starch. Sweets and desserts  Some toffees. Chocolate-coated nuts (may be rolled in wheat flour) and some commercial candies and candy bars. Most cakes, cookies, donuts, pastries, and other baked goods. Some commercial ice cream. Ice cream cones. Commercially prepared mixes for cakes, cookies, and other desserts. Bread pudding and other puddings thickened with flour. Products containing brown rice syrup made with barley malt enzyme. Desserts and sweets made with malt flavoring. Seasoning and other foods  Some curry powders, some dry seasoning mixes, some gravy extracts, some meat sauces, some ketchups, some prepared mustards, and horseradish. Certain soy sauces. Malt vinegar. Bouillon and bouillon cubes that contain HVP. Some chip dips, and some chewing gum. Yeast extract. Brewer's yeast. Caramel color. Some medicines and supplements. Some lip glosses and other cosmetics. The items listed may not be a complete list. Talk with your dietitian about what dietary choices are best for you. Summary  Gluten is a protein that is found in wheat, rye, barley, and some other grains. The gluten-free diet includes all foods that  do not contain gluten.  If you need help finding gluten-free foods or if you  have questions, talk with your diet and nutrition specialist (registered dietitian) or your health care provider.  Read all food labels. Gluten is often added to foods. Always check the ingredient list and look for warnings, such as "may contain gluten." This information is not intended to replace advice given to you by your health care provider. Make sure you discuss any questions you have with your health care provider. Document Revised: 08/06/2017 Document Reviewed: 06/08/2016 Elsevier Patient Education  2020 [employer].

## 2020-01-23 NOTE — Progress Notes (Signed)
New Patient Office Visit     This visit occurred during the SARS-CoV-2 public health emergency.  Safety protocols were in place, including screening questions prior to the visit, additional usage of staff PPE, and extensive cleaning of exam room while observing appropriate contact time as indicated for disinfecting solutions.    CC/Reason for Visit: Establish care, concerned about rash Previous PCP: Unknown Last Visit: Unknown  HPI: Bryce GRANBERG Sr. is a 44 y.o. male who is coming in today for the above mentioned reasons. Past Medical History is significant for: Fibromyalgia.  He is also legally blind and is on disability because of this, this is due to glaucoma.  He is married, has 2 sons.  He has no known drug allergies, does not smoke, does not drink.  His family history is significant for mother with diabetes and hypertension, father with diabetes, hypertension and history of strokes, his paternal grandfather also had a stroke.  He is concerned about possibility of celiac disease.  He had a rash on his torso and was told that this was due to gluten sensitivity.  He at times has abdominal bloating but never true weight loss or diarrhea.  No family history of celiac disease.   Past Medical/Surgical History: Past Medical History:  Diagnosis Date  . Allergy   . Cataract   . Fibromyalgia   . Glaucoma     Past Surgical History:  Procedure Laterality Date  . EYE SURGERY    . HERNIA REPAIR      Social History:  reports that he has never smoked. He has never used smokeless tobacco. He reports that he does not drink alcohol or use drugs.  Allergies: Allergies  Allergen Reactions  . Citrus Anaphylaxis  . Latex Rash    Family History:  Family History  Problem Relation Age of Onset  . Diabetes Mother   . Hypertension Mother   . Hyperlipidemia Father   . Hypertension Father   . Stroke Father   . Hypertension Brother   . Cancer Maternal Grandmother   . Cancer Paternal  Grandfather      Current Outpatient Medications:  .  brimonidine (ALPHAGAN) 0.2 % ophthalmic solution, Place 1 drop into both eyes 2 (two) times daily. , Disp: , Rfl:  .  cyclobenzaprine (FLEXERIL) 5 MG tablet, Take 1 tablet (5 mg total) by mouth 2 (two) times daily as needed for muscle spasms., Disp: 20 tablet, Rfl: 0 .  dorzolamide-timolol (COSOPT) 22.3-6.8 MG/ML ophthalmic solution, Place 1 drop into both eyes 2 (two) times daily., Disp: , Rfl:  .  DULoxetine (CYMBALTA) 20 MG capsule, Take 2 capsules (40 mg total) by mouth daily., Disp: 60 capsule, Rfl: 3 .  gabapentin (NEURONTIN) 300 MG capsule, Take 1 capsule (300 mg total) by mouth 3 (three) times daily., Disp: 270 capsule, Rfl: 3 .  naproxen (NAPROSYN) 500 MG tablet, Take 1 tablet (500 mg total) by mouth 2 (two) times daily., Disp: 30 tablet, Rfl: 0 .  travoprost, benzalkonium, (TRAVATAN) 0.004 % ophthalmic solution, Place 1 drop into both eyes at bedtime., Disp: , Rfl:   Review of Systems:  Constitutional: Denies fever, chills, diaphoresis, appetite change and fatigue.  HEENT: Denies photophobia, eye pain, redness, hearing loss, ear pain, congestion, sore throat, rhinorrhea, sneezing, mouth sores, trouble swallowing, neck pain, neck stiffness and tinnitus.   Respiratory: Denies SOB, DOE, cough, chest tightness,  and wheezing.   Cardiovascular: Denies chest pain, palpitations and leg swelling.  Gastrointestinal: Denies nausea, vomiting, abdominal  pain, diarrhea, constipation, blood in stool and abdominal distention.  Genitourinary: Denies dysuria, urgency, frequency, hematuria, flank pain and difficulty urinating.  Endocrine: Denies: hot or cold intolerance, sweats, changes in hair or nails, polyuria, polydipsia. Musculoskeletal: Denies myalgias, back pain, joint swelling, arthralgias and gait problem.  Skin: Denies pallor, rash and wound.  Neurological: Denies dizziness, seizures, syncope, weakness, light-headedness, numbness and  headaches.  Hematological: Denies adenopathy. Easy bruising, personal or family bleeding history  Psychiatric/Behavioral: Denies suicidal ideation, mood changes, confusion, nervousness, sleep disturbance and agitation    Physical Exam: Vitals:   01/23/20 1438  BP: 130/90  Pulse: 94  Temp: 97.7 F (36.5 C)  TempSrc: Temporal  SpO2: 97%  Weight: 274 lb (124.3 kg)  Height: 6' 2.5" (1.892 m)   Body mass index is 34.71 kg/m.  Constitutional: NAD, calm, comfortable Eyes: PERRL, lids and conjunctivae normal ENMT: Mucous membranes are moist. Respiratory: clear to auscultation bilaterally, no wheezing, no crackles. Normal respiratory effort. No accessory muscle use.  Cardiovascular: Regular rate and rhythm, no murmurs / rubs / gallops. No extremity edema.  Neurologic: Grossly intact and nonfocal Psychiatric: Normal judgment and insight. Alert and oriented x 3. Normal mood.    Impression and Plan:  Abdominal bloating  - Plan: Tissue Transglutaminase Abs,IgG,IgA -I believe celiac disease is unlikely, no rashes evident on exam today.  Legally blind -Noted, on disability  Fibromyalgia -On gabapentin  Obesity (BMI 30.0-34.9) -Discussed healthy lifestyle, including increased physical activity and better food choices to promote weight loss.  Elevated BP without diagnosis of hypertension -Blood pressure in office today is 130/90, ambulatory measurements and return in 3 months for follow-up.    Patient Instructions  -Nice seeing you today!!  -Lab work today; will notify you once results are available.  -Please schedule follow up for your physical in 3 months. Come in fasting that day.   Gluten-Free Diet for Celiac Disease, Adult  The gluten-free diet includes all foods that do not contain gluten. Gluten is a protein that is found in wheat, rye, barley, and some other grains. Following the gluten-free diet is the only treatment for people with celiac disease. It helps to  prevent damage to the intestines and improves or eliminates the symptoms of celiac disease. Following the gluten-free diet requires some planning. It can be challenging at first, but it gets easier with time and practice. There are more gluten-free options available today than ever before. If you need help finding gluten-free foods or if you have questions, talk with your diet and nutrition specialist (registered dietitian) or your health care provider. What do I need to know about a gluten-free diet?  All fruits, vegetables, and meats are safe to eat and do not contain gluten.  When grocery shopping, start by shopping in the produce, meat, and dairy sections. These sections are more likely to contain gluten-free foods. Then move to the aisles that contain packaged foods if you need to.  Read all food labels. Gluten is often added to foods. Always check the ingredient list and look for warnings, such as "may contain gluten."  Talk with your dietitian or health care provider before taking a gluten-free multivitamin or mineral supplement.  Be aware of gluten-free foods having contact with foods that contain gluten (cross-contamination). This can happen at home and with any processed foods. ? Talk with your health care provider or dietitian about how to reduce the risk of cross-contamination in your home. ? If you have questions about how a food is processed,  ask the manufacturer. What key words help to identify gluten? Foods that list any of these key words on the label usually contain gluten:  Wheat, flour, enriched flour, bromated flour, white flour, durum flour, graham flour, phosphated flour, self-rising flour, semolina, farina, barley (malt), rye, and oats.  Starch, dextrin, modified food starch, or cereal.  Thickening, fillers, or emulsifiers.  Malt flavoring, malt extract, or malt syrup.  Hydrolyzed vegetable protein. In the [place] packaged foods that are gluten-free are required to be  labeled "GF." These foods should be easy to identify and are safe to eat. In the [place] food companies are also required to list common food allergens, including wheat, on their labels. Recommended foods Grains  Amaranth, bean flours, 100% buckwheat flour, corn, millet, nut flours or nut meals, GF oats, quinoa, rice, sorghum, teff, rice wafers, pure cornmeal tortillas, popcorn, and hot cereals made from cornmeal. Hominy, rice, wild rice. Some Asian rice noodles or bean noodles. Arrowroot starch, corn bran, corn flour, corn germ, cornmeal, corn starch, potato flour, potato starch flour, and rice bran. Plain, brown, and sweet rice flours. Rice polish, soy flour, and tapioca starch. Vegetables  All plain fresh, frozen, and canned vegetables. Fruits  All plain fresh, frozen, canned, and dried fruits, and 100% fruit juices. Meats and other protein foods  All fresh beef, pork, poultry, fish, seafood, and eggs. Fish canned in water, oil, brine, or vegetable broth. Plain nuts and seeds, peanut butter. Some lunch meat and some frankfurters. Dried beans, dried peas, and lentils. Dairy  Fresh plain, dry, evaporated, or condensed milk. Cream, butter, sour cream, whipping cream, and most yogurts. Unprocessed cheese, most processed cheeses, some cottage cheese, some cream cheeses. Beverages  Coffee, tea, most herbal teas. Carbonated beverages and some root beers. Wine, sake, and distilled spirits, such as gin, vodka, and whiskey. Most hard ciders. Fats and oils  Butter, margarine, vegetable oil, hydrogenated butter, olive oil, shortening, lard, cream, and some mayonnaise. Some commercial salad dressings. Olives. Sweets and desserts  Sugar, honey, some syrups, molasses, jelly, and jam. Plain hard candy, marshmallows, and gumdrops. Pure cocoa powder. Plain chocolate. Custard and some pudding mixes. Gelatin desserts, sorbets, frozen ice pops, and sherbet. Cake, cookies, and other desserts prepared with  allowed flours. Some commercial ice creams. Cornstarch, tapioca, and rice puddings. Seasoning and other foods  Some canned or frozen soups. Monosodium glutamate (MSG). Cider, rice, and wine vinegar. Baking soda and baking powder. Cream of tartar. Baking and nutritional yeast. Certain soy sauces made without wheat (ask your dietitian about specific brands that are allowed). Nuts, coconut, and chocolate. Salt, pepper, herbs, spices, flavoring extracts, imitation or artificial flavorings, natural flavorings, and food colorings. Some medicines and supplements. Some lip glosses and other cosmetics. Rice syrups. The items listed may not be a complete list. Talk with your dietitian about what dietary choices are best for you. Foods to avoid Grains  Barley, bran, bulgur, couscous, cracked wheat, [place], farro, graham, malt, matzo, semolina, wheat germ, and all wheat and rye cereals including spelt and kamut. Cereals containing malt as a flavoring, such as rice cereal. Noodles, spaghetti, macaroni, most packaged rice mixes, and all mixes containing wheat, rye, barley, or triticale. Vegetables  Most creamed vegetables and most vegetables canned in sauces. Some commercially prepared vegetables and salads. Fruits  Thickened or prepared fruits and some pie fillings. Some fruit snacks and fruit roll-ups. Meats and other protein foods  Any meat or meat alternative containing wheat, rye, barley, or gluten stabilizers. These are  often marinated or packaged meats and lunch meats. Bread-containing products, such as Swiss steak, croquettes, meatballs, and meatloaf. Most tuna canned in vegetable broth and [place] with hydrolyzed vegetable protein (HVP) injected as part of the basting. [place]. Imitation fish. Eggs in sauces made from ingredients to avoid. Dairy  Commercial chocolate milk drinks and malted milk. Some non-dairy creamers. Any cheese product containing ingredients to avoid. Beverages  Certain cereal  beverages. Beer, ale, malted milk, and some root beers. Some hard ciders. Some instant flavored coffees. Some herbal teas made with barley or with barley malt added. Fats and oils  Some commercial salad dressings. Sour cream containing modified food starch. Sweets and desserts  Some toffees. Chocolate-coated nuts (may be rolled in wheat flour) and some commercial candies and candy bars. Most cakes, cookies, donuts, pastries, and other baked goods. Some commercial ice cream. Ice cream cones. Commercially prepared mixes for cakes, cookies, and other desserts. Bread pudding and other puddings thickened with flour. Products containing brown rice syrup made with barley malt enzyme. Desserts and sweets made with malt flavoring. Seasoning and other foods  Some curry powders, some dry seasoning mixes, some gravy extracts, some meat sauces, some ketchups, some prepared mustards, and horseradish. Certain soy sauces. Malt vinegar. Bouillon and bouillon cubes that contain HVP. Some chip dips, and some chewing gum. Yeast extract. Brewer's yeast. Caramel color. Some medicines and supplements. Some lip glosses and other cosmetics. The items listed may not be a complete list. Talk with your dietitian about what dietary choices are best for you. Summary  Gluten is a protein that is found in wheat, rye, barley, and some other grains. The gluten-free diet includes all foods that do not contain gluten.  If you need help finding gluten-free foods or if you have questions, talk with your diet and nutrition specialist (registered dietitian) or your health care provider.  Read all food labels. Gluten is often added to foods. Always check the ingredient list and look for warnings, such as "may contain gluten." This information is not intended to replace advice given to you by your health care provider. Make sure you discuss any questions you have with your health care provider. Document Revised: 08/06/2017 Document  Reviewed: 06/08/2016 Elsevier Patient Education  2020 [employer]      Chaya Jan, MD [facility]

## 2020-01-24 ENCOUNTER — Encounter: Payer: Self-pay | Admitting: Family Medicine

## 2020-01-24 ENCOUNTER — Ambulatory Visit (INDEPENDENT_AMBULATORY_CARE_PROVIDER_SITE_OTHER): Payer: Medicare Other | Admitting: Family Medicine

## 2020-01-24 VITALS — BP 110/72 | HR 94 | Ht 74.5 in | Wt 274.0 lb

## 2020-01-24 DIAGNOSIS — M999 Biomechanical lesion, unspecified: Secondary | ICD-10-CM | POA: Diagnosis not present

## 2020-01-24 DIAGNOSIS — M545 Low back pain, unspecified: Secondary | ICD-10-CM

## 2020-01-24 DIAGNOSIS — G8929 Other chronic pain: Secondary | ICD-10-CM | POA: Diagnosis not present

## 2020-01-24 NOTE — Assessment & Plan Note (Signed)
   Decision today to treat with OMT was based on Physical Exam  After verbal consent patient was treated with HVLA, ME, FPR techniques in , thoracic,  lumbar and sacral areas, all areas are chronic   Patient tolerated the procedure well with improvement in symptoms  Patient given exercises, stretches and lifestyle modifications  See medications in patient instructions if given  Patient will follow up in 4-8 weeks 

## 2020-01-24 NOTE — Patient Instructions (Signed)
Scapular exercises See me again in 5 weeks
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
## 2020-01-26 LAB — TISSUE TRANSGLUTAMINASE ABS,IGG,IGA
(tTG) Ab, IgA: 1 U/mL
(tTG) Ab, IgG: 9 U/mL — ABNORMAL HIGH

## 2020-02-29 ENCOUNTER — Other Ambulatory Visit: Payer: Self-pay

## 2020-02-29 ENCOUNTER — Encounter: Payer: Self-pay | Admitting: Family Medicine

## 2020-02-29 ENCOUNTER — Ambulatory Visit (INDEPENDENT_AMBULATORY_CARE_PROVIDER_SITE_OTHER): Payer: Medicare Other | Admitting: Family Medicine

## 2020-02-29 VITALS — BP 124/90 | HR 72 | Ht 74.0 in | Wt 279.0 lb

## 2020-02-29 DIAGNOSIS — M999 Biomechanical lesion, unspecified: Secondary | ICD-10-CM

## 2020-02-29 DIAGNOSIS — M545 Low back pain, unspecified: Secondary | ICD-10-CM

## 2020-02-29 DIAGNOSIS — G8929 Other chronic pain: Secondary | ICD-10-CM | POA: Diagnosis not present

## 2020-02-29 DIAGNOSIS — M5030 Other cervical disc degeneration, unspecified cervical region: Secondary | ICD-10-CM | POA: Insufficient documentation

## 2020-02-29 MED ORDER — MELOXICAM 7.5 MG PO TABS
7.5000 mg | ORAL_TABLET | Freq: Every day | ORAL | 0 refills | Status: DC
Start: 2020-02-29 — End: 2020-04-27

## 2020-02-29 MED ORDER — GABAPENTIN 100 MG PO CAPS
200.0000 mg | ORAL_CAPSULE | Freq: Every day | ORAL | 3 refills | Status: AC
Start: 2020-02-29 — End: ?

## 2020-02-29 MED ORDER — KETOROLAC TROMETHAMINE 30 MG/ML IJ SOLN
30.0000 mg | Freq: Once | INTRAMUSCULAR | Status: AC
  Administered 2020-02-29: 30 mg via INTRAMUSCULAR

## 2020-02-29 NOTE — Patient Instructions (Signed)
Good to see you Gabapentin 200 mg Meloxicam daily See me again in 3 -4 weeks

## 2020-02-29 NOTE — Progress Notes (Signed)
[place] [address] [place] Phone: [phone] Subjective:   I [name] am serving as a Education administrator for Dr. Hulan Saas.  This visit occurred during the SARS-CoV-2 public health emergency.  Safety protocols were in place, including screening questions prior to the visit, additional usage of staff PPE, and extensive cleaning of exam room while observing appropriate contact time as indicated for disinfecting solutions.   I'm seeing this patient by the request  of:  Bryce [name], Bryce Halsted, MD  CC: Finger pain and weakness  [id number]  Bryce [name] Sr. is a 44 y.o. male coming in with complaint of back and neck pain Patient states his neck is tight. Wants to look at first finger on right hand. States he has a lot of weakness.  Has not tried any medications for that at the moment.         Reviewed prior external information including notes and imaging from previsou exam, outside providers and external EMR if available.   As well as notes that were available from care everywhere and other healthcare systems.  Past medical history, social, surgical and family history all reviewed in electronic medical record.  No pertanent information unless stated regarding to the chief complaint.   Past Medical History:  Diagnosis Date  . Allergy   . Cataract   . Fibromyalgia   . Glaucoma     Allergies  Allergen Reactions  . Citrus Anaphylaxis  . Latex Rash     Review of Systems:  No headache, visual changes, nausea, vomiting, diarrhea, constipation, dizziness, abdominal pain, skin rash, fevers, chills, night sweats, weight loss, swollen lymph nodes, body aches, joint swelling, chest pain, shortness of breath, mood changes. POSITIVE muscle aches  Objective  Blood pressure 124/90, pulse 72, height 6\' 2"  (1.88 m), weight 279 lb (126.6 kg), SpO2 98 %.   General: No apparent distress alert and oriented x3 mood and affect normal,  dressed appropriately.  HEENT: Pupils equal, extraocular movements intact  Respiratory: Patient's speak in full sentences and does not appear short of breath  Cardiovascular: No lower extremity edema, non tender, no erythema  Neuro: Cranial nerves II through XII are intact, neurovascularly intact in all extremities with 2+ DTRs and 2+ pulses.  Gait normal with good balance and coordination.  MSK:  Non tender with full range of motion and good stability and symmetric strength and tone of shoulders, elbows, wrist, hip, knee and ankles bilaterally.  Neck exam does have some loss of lordosis.  Tender to palpation in the paraspinal musculature in the cervical region.  Mild positive Spurling's in the C6 distribution.  The left finger and strength is weaker on the left side of the hand.  No atrophy of the musculature noted  Osteopathic findings  C2 flexed rotated and side bent right T5 extended rotated and side bent left L2 flexed rotated and side bent right Sacrum right on right       Assessment and Plan:  Degenerative disc disease, cervical Degenerative disc disease of the cervical spine.  Patient is having some mild radicular symptoms with positive Spurling's today.  Started on gabapentin as well as started on low-dose of anti-inflammatory.  Secondary to patient's glaucoma will avoid    Nonallopathic problems  Decision today to treat with OMT was based on Physical Exam  After verbal consent patient was treated with HVLA, ME, FPR techniques in cervical, , thoracic, lumbar, and sacral  areas  Patient tolerated the  procedure well with improvement in symptoms  Patient given exercises, stretches and lifestyle modifications  See medications in patient instructions if given  Patient will follow up in 4-8 weeks      The above documentation has been reviewed and is accurate and complete [name], DO       Note: This dictation was prepared with Dragon dictation along with  smaller phrase technology. Any transcriptional errors that result from this process are unintentional.

## 2020-02-29 NOTE — Assessment & Plan Note (Signed)
Degenerative disc disease of the cervical spine.  Patient is having some mild radicular symptoms with positive Spurling's today.  Started on gabapentin as well as started on low-dose of anti-inflammatory.  Secondary to patient's glaucoma will avoid

## 2020-03-03 ENCOUNTER — Other Ambulatory Visit: Payer: Self-pay

## 2020-03-03 ENCOUNTER — Ambulatory Visit
Admission: EM | Admit: 2020-03-03 | Discharge: 2020-03-03 | Disposition: A | Discharge status: Home / Self Care | Payer: Medicare Other | Attending: Physician Assistant | Admitting: Physician Assistant

## 2020-03-03 DIAGNOSIS — H9201 Otalgia, right ear: Secondary | ICD-10-CM

## 2020-03-03 MED ORDER — AZELASTINE HCL 0.1 % NA SOLN
2.0000 | Freq: Two times a day (BID) | NASAL | 0 refills | Status: DC
Start: 2020-03-03 — End: 2020-04-27

## 2020-03-03 NOTE — ED Triage Notes (Signed)
Patient presents with a c/o right ear pain that started yesterday.  He denies any associated cold symptoms.

## 2020-03-03 NOTE — ED Provider Notes (Signed)
[facility]    CSN: [id number] Arrival date & time: 03/03/20  2774      History   Chief Complaint Chief Complaint  Patient presents with  . Otalgia    HPI Bryce BRIBIESCA Sr. is a 44 y.o. male.   44 year old male comes in for 2 day history of right ear pain. Has irritation constantly with occasional pain. Denies muffled hearing, ear drainage. Denies fever, URI symptoms. Ear drops without relief.      Past Medical History:  Diagnosis Date  . Allergy   . Cataract   . Fibromyalgia   . Glaucoma     Patient Active Problem List   Diagnosis Date Noted  . Degenerative disc disease, cervical 02/29/2020  . Left hamstring muscle strain 10/16/2019  . Acute meniscal tear of knee, left, initial encounter 05/16/2019  . Ganglion cyst of finger of left hand 07/26/2018  . Finger sprain 05/19/2018  . Back pain 10/04/2017  . Nonallopathic lesion of lumbosacral region 10/04/2017  . Nonallopathic lesion of sacral region 10/04/2017  . Nonallopathic lesion of thoracic region 10/04/2017  . Patellofemoral arthritis of right knee 02/24/2016  . Degenerative arthritis of knee 02/24/2016  . Acute medial meniscal tear 11/21/2015  . Right knee pain 11/08/2015  . Pain of right heel 11/08/2015  . Right ear pain 04/23/2015  . Fibromyalgia 03/15/2015  . Glaucoma 03/15/2015  . Legally blind 03/15/2015    Past Surgical History:  Procedure Laterality Date  . EYE SURGERY    . HERNIA REPAIR         Home Medications    Prior to Admission medications   Medication Sig Start Date End Date Taking? Authorizing Provider  azelastine (ASTELIN) 0.1 % nasal spray Place 2 sprays into both nostrils 2 (two) times daily. 03/03/20   Cathie Hoops, Tranice Laduke V, PA-C  brimonidine (ALPHAGAN) 0.2 % ophthalmic solution Place 1 drop into both eyes 2 (two) times daily.     [provider]  dorzolamide-timolol (COSOPT) 22.3-6.8 MG/ML ophthalmic solution Place 1 drop into both eyes 2 (two) times daily.     [provider]  DULoxetine (CYMBALTA) 20 MG capsule Take 2 capsules (40 mg total) by mouth daily. 10/04/17   [name], DO  gabapentin (NEURONTIN) 100 MG capsule Take 2 capsules (200 mg total) by mouth at bedtime. 02/29/20   [name], DO  meloxicam (MOBIC) 7.5 MG tablet Take 1 tablet (7.5 mg total) by mouth daily. 02/29/20   [name], DO  naproxen (NAPROSYN) 500 MG tablet Take 1 tablet (500 mg total) by mouth 2 (two) times daily. 12/28/19   Hall-Potvin, [place], PA-C  travoprost, benzalkonium, (TRAVATAN) 0.004 % ophthalmic solution Place 1 drop into both eyes at bedtime.    [provider]    Family History Family History  Problem Relation Age of Onset  . Diabetes Mother   . Hypertension Mother   . Hyperlipidemia Father   . Hypertension Father   . Stroke Father   . Hypertension Brother   . Cancer Maternal Grandmother   . Cancer Paternal Grandfather     Social History Social History   Tobacco Use  . Smoking status: Never Smoker  . Smokeless tobacco: Never Used  Substance Use Topics  . Alcohol use: No  . Drug use: No     Allergies   Citrus and Latex   Review of Systems Review of Systems  Reason unable to perform ROS: See HPI as above.  Physical Exam Triage Vital Signs ED Triage Vitals  Enc Vitals Group     BP 03/03/20 0914 (!) 134/93     Pulse Rate 03/03/20 0914 84     Resp 03/03/20 0914 16     Temp 03/03/20 0914 98.2 F (36.8 C)     Temp Source 03/03/20 0914 Oral     SpO2 03/03/20 0914 96 %     Weight --      Height --      Head Circumference --      Peak Flow --      Pain Score 03/03/20 0924 7     Pain Loc --      Pain Edu? --      Excl. in [place]? --    No data found.  Updated Vital Signs BP (!) 134/93 (BP Location: Left Arm)   Pulse 84   Temp 98.2 F (36.8 C) (Oral)   Resp 16   SpO2 96%   Physical Exam Constitutional:      General: He is not in acute distress.    Appearance: Normal appearance. He is  well-developed. He is not toxic-appearing or diaphoretic.  HENT:     Head: Normocephalic and atraumatic.     Right Ear: Tympanic membrane, ear canal and external ear normal. Tympanic membrane is not erythematous or bulging.     Left Ear: Tympanic membrane, ear canal and external ear normal. Tympanic membrane is not erythematous or bulging.     Ears:     Comments: No tragal tenderness bilaterally.  Eyes:     Conjunctiva/sclera: Conjunctivae normal.     Pupils: Pupils are equal, round, and reactive to light.  Pulmonary:     Effort: Pulmonary effort is normal. No respiratory distress.     Comments: Speaking in full sentences without difficulty Musculoskeletal:     Cervical back: Normal range of motion and neck supple.  Skin:    General: Skin is warm and dry.  Neurological:     Mental Status: He is alert and oriented to person, place, and time.      UC Treatments / Results  Labs (all labs ordered are listed, but only abnormal results are displayed) Labs Reviewed - No data to display  EKG   Radiology No results found.  Procedures Procedures (including critical care time)  Medications Ordered in UC Medications - No data to display  Initial Impression / Assessment and Plan / UC Course  I have reviewed the triage vital signs and the nursing notes.  Pertinent labs & imaging results that were available during my care of the patient were reviewed by me and considered in my medical decision making (see chart for details).    Discussed possible eustachian tube dysfunction causing symptoms. Will have patient start flonase as directed, add azealstine as needed. Return precautions given.  Final Clinical Impressions(s) / UC Diagnoses   Final diagnoses:  Right ear pain    ED Prescriptions    Medication Sig Dispense Auth. Provider   azelastine (ASTELIN) 0.1 % nasal spray Place 2 sprays into both nostrils 2 (two) times daily. 30 mL Ok Edwards, PA-C     PDMP not reviewed this  encounter.   Ok Edwards, PA-C 03/03/20 [phone]

## 2020-03-03 NOTE — Discharge Instructions (Signed)
Start over the counter flonase 2 sprays daily for 1 week. If symptoms not improving, can add azelastine as directed. If symptoms not improving, follow up with PCP for further evaluation. If having worsening ear pain, fever, follow up for reevaluation.

## 2020-04-18 ENCOUNTER — Ambulatory Visit: Payer: Self-pay

## 2020-04-18 ENCOUNTER — Encounter: Payer: Self-pay | Admitting: Family Medicine

## 2020-04-18 ENCOUNTER — Ambulatory Visit (INDEPENDENT_AMBULATORY_CARE_PROVIDER_SITE_OTHER): Payer: Medicare Other | Admitting: Family Medicine

## 2020-04-18 ENCOUNTER — Other Ambulatory Visit: Payer: Self-pay

## 2020-04-18 VITALS — BP 112/68 | HR 82 | Ht 74.0 in | Wt 285.0 lb

## 2020-04-18 DIAGNOSIS — S83207A Unspecified tear of unspecified meniscus, current injury, left knee, initial encounter: Secondary | ICD-10-CM

## 2020-04-18 DIAGNOSIS — M545 Low back pain, unspecified: Secondary | ICD-10-CM

## 2020-04-18 DIAGNOSIS — G8929 Other chronic pain: Secondary | ICD-10-CM

## 2020-04-18 DIAGNOSIS — M999 Biomechanical lesion, unspecified: Secondary | ICD-10-CM

## 2020-04-18 DIAGNOSIS — M25562 Pain in left knee: Secondary | ICD-10-CM | POA: Diagnosis not present

## 2020-04-18 NOTE — Patient Instructions (Addendum)
Injected knee today  See me again in 6 weeks 

## 2020-04-18 NOTE — Progress Notes (Signed)
[name] [address] [place] [address] Phone: [phone] Subjective:   [name], am serving as a scribe for Dr. Antoine Primas. This visit occurred during the SARS-CoV-2 public health emergency.  Safety protocols were in place, including screening questions prior to the visit, additional usage of staff PPE, and extensive cleaning of exam room while observing appropriate contact time as indicated for disinfecting solutions.   I'm seeing this patient by the request  of:  [name], [name], MD  CC: Low back pain follow-up  [id number]  Bryce KOLAR Sr. is a 44 y.o. male coming in with complaint of back and neck pain. OMT 02/29/2020. Patient states that he is having tightness in upper back.   Left knee pain. Medial aspect. Catching for past 2 days.   Medications patient has been prescribed: Meloxicam or naproxen, gabapentin and duloxetine         Reviewed prior external information including notes and imaging from previsou exam, outside providers and external EMR if available.   As well as notes that were available from care everywhere and other healthcare systems.  Past medical history, social, surgical and family history all reviewed in electronic medical record.  No pertanent information unless stated regarding to the chief complaint.   Past Medical History:  Diagnosis Date  . Allergy   . Cataract   . Fibromyalgia   . Glaucoma     Allergies  Allergen Reactions  . Citrus Anaphylaxis  . Latex Rash     Review of Systems:  No headache, visual changes, nausea, vomiting, diarrhea, constipation, dizziness, abdominal pain, skin rash, fevers, chills, night sweats, weight loss, swollen lymph nodes, body aches, joint swelling, chest pain, shortness of breath, mood changes. POSITIVE muscle aches  Objective  There were no vitals taken for this visit.   General: No apparent distress alert and oriented x3 mood and affect  normal, dressed appropriately.  HEENT: Pupils equal, extraocular movements intact  Respiratory: Patient's speak in full sentences and does not appear short of breath  Cardiovascular: No lower extremity edema, non tender, no erythema  Neuro: Cranial nerves II through XII are intact, neurovascularly intact in all extremities with 2+ DTRs and 2+ pulses.  Gait normal with good balance and coordination.  MSK:  N left knee pain noted.  Tender to palpation over the medial aspect of the knee and more the anterior medial.  Mild positive McMurray's.  Near full range of motion otherwise. Back -back exam still poor core strength.  Patient does have tightness noted in all planes.  Patient has a negative straight leg test but tightness of the hamstring.  Deep tendon reflexes intact.  Tenderness in the thoracolumbar juncture  Limited musculoskeletal ultrasound was performed and interpreted by [name]  Left knee does have what appears to be a very small anterior will medial meniscal tear noted with what appears to be an overlying possible perimeniscal cyst versus abnormal bursitis.  No abnormal vascularity in the area.  Osteopathic findings  T9 extended rotated and side bent left L2 flexed rotated and side bent right Sacrum right on right  After informed written and verbal consent, patient was seated on exam table. Left knee was prepped with alcohol swab and utilizing an anterior medial approach, patient's left knee space was injected with 4:1  marcaine 0.5%: Kenalog 40mg /dL. Patient tolerated the procedure well without immediate complications.     Assessment and Plan:    Nonallopathic problems  Decision  today to treat with OMT was based on Physical Exam  After verbal consent patient was treated with HVLA, ME, FPR techniques in thoracic, lumbar, and sacral  areas  Patient tolerated the procedure well with improvement in symptoms  Patient given exercises, stretches and lifestyle  modifications  See medications in patient instructions if given  Patient will follow up in 4-8 weeks      The above documentation has been reviewed and is accurate and complete [name], DO       Note: This dictation was prepared with Dragon dictation along with smaller phrase technology. Any transcriptional errors that result from this process are unintentional.

## 2020-04-18 NOTE — Assessment & Plan Note (Signed)
Chronic problem with exacerbation.  Discussed medications including gabapentin and the Cymbalta at this time.  Discussed continuing the amount of dose we are out of the moment.  Anti-inflammatories for breakthrough.  Discussed posture and ergonomics and core strengthening.  Follow-up again in 4 to 8 weeks

## 2020-04-18 NOTE — Assessment & Plan Note (Signed)
Injected today, tolerated the procedure well.  Patient had near complete resolution of pain almost immediately.  Discussed icing regimen and home exercises, discussed avoiding twisting motions.  Follow-up again in 4 to 6 weeks

## 2020-04-24 ENCOUNTER — Other Ambulatory Visit: Payer: Self-pay

## 2020-04-24 ENCOUNTER — Ambulatory Visit (INDEPENDENT_AMBULATORY_CARE_PROVIDER_SITE_OTHER): Payer: Medicare Other | Admitting: Internal Medicine

## 2020-04-24 ENCOUNTER — Encounter: Payer: Self-pay | Admitting: Internal Medicine

## 2020-04-24 VITALS — BP 120/80 | HR 104 | Temp 98.5°F | Wt 285.1 lb

## 2020-04-24 DIAGNOSIS — Z1211 Encounter for screening for malignant neoplasm of colon: Secondary | ICD-10-CM | POA: Diagnosis not present

## 2020-04-24 DIAGNOSIS — R030 Elevated blood-pressure reading, without diagnosis of hypertension: Secondary | ICD-10-CM

## 2020-04-24 NOTE — Patient Instructions (Signed)
-  Nice seeing you today!!  -Schedule follow up for your physical. Please come in fasting that day.   

## 2020-04-24 NOTE — Addendum Note (Signed)
Addended by: Kern Reap B on: 04/24/2020 03:43 PM   Modules accepted: Orders

## 2020-04-24 NOTE — Progress Notes (Signed)
Established Patient Office Visit     This visit occurred during the SARS-CoV-2 public health emergency.  Safety protocols were in place, including screening questions prior to the visit, additional usage of staff PPE, and extensive cleaning of exam room while observing appropriate contact time as indicated for disinfecting solutions.    CC/Reason for Visit: Follow-up blood pressure  HPI: Bryce ILIC Sr. is a 44 y.o. male who is coming in today for the above mentioned reasons. Past Medical History is significant for: Fibromyalgia, glaucoma.  Last time in the office he was noticed to have an elevated blood pressure and was asked to follow-up today.  He has had his Covid vaccines.  He is overdue for an annual physical.  He requests referral to GI for screening colonoscopy.   Past Medical/Surgical History: Past Medical History:  Diagnosis Date  . Allergy   . Cataract   . Fibromyalgia   . Glaucoma     Past Surgical History:  Procedure Laterality Date  . EYE SURGERY    . HERNIA REPAIR      Social History:  reports that he has never smoked. He has never used smokeless tobacco. He reports that he does not drink alcohol and does not use drugs.  Allergies: Allergies  Allergen Reactions  . Citrus Anaphylaxis  . Latex Rash    Family History:  Family History  Problem Relation Age of Onset  . Diabetes Mother   . Hypertension Mother   . Hyperlipidemia Father   . Hypertension Father   . Stroke Father   . Hypertension Brother   . Cancer Maternal Grandmother   . Cancer Paternal Grandfather      Current Outpatient Medications:  .  azelastine (ASTELIN) 0.1 % nasal spray, Place 2 sprays into both nostrils 2 (two) times daily., Disp: 30 mL, Rfl: 0 .  brimonidine (ALPHAGAN) 0.2 % ophthalmic solution, Place 1 drop into both eyes 2 (two) times daily. , Disp: , Rfl:  .  dorzolamide-timolol (COSOPT) 22.3-6.8 MG/ML ophthalmic solution, Place 1 drop into both eyes 2 (two) times  daily., Disp: , Rfl:  .  DULoxetine (CYMBALTA) 20 MG capsule, Take 2 capsules (40 mg total) by mouth daily., Disp: 60 capsule, Rfl: 3 .  gabapentin (NEURONTIN) 100 MG capsule, Take 2 capsules (200 mg total) by mouth at bedtime., Disp: 180 capsule, Rfl: 3 .  meloxicam (MOBIC) 7.5 MG tablet, Take 1 tablet (7.5 mg total) by mouth daily., Disp: 30 tablet, Rfl: 0 .  naproxen (NAPROSYN) 500 MG tablet, Take 1 tablet (500 mg total) by mouth 2 (two) times daily., Disp: 30 tablet, Rfl: 0 .  travoprost, benzalkonium, (TRAVATAN) 0.004 % ophthalmic solution, Place 1 drop into both eyes at bedtime., Disp: , Rfl:   Review of Systems:  Constitutional: Denies fever, chills, diaphoresis, appetite change and fatigue.  HEENT: Denies photophobia, eye pain, redness, hearing loss, ear pain, congestion, sore throat, rhinorrhea, sneezing, mouth sores, trouble swallowing, neck pain, neck stiffness and tinnitus.   Respiratory: Denies SOB, DOE, cough, chest tightness,  and wheezing.   Cardiovascular: Denies chest pain, palpitations and leg swelling.  Gastrointestinal: Denies nausea, vomiting, abdominal pain, diarrhea, constipation, blood in stool and abdominal distention.  Genitourinary: Denies dysuria, urgency, frequency, hematuria, flank pain and difficulty urinating.  Endocrine: Denies: hot or cold intolerance, sweats, changes in hair or nails, polyuria, polydipsia. Musculoskeletal: Denies myalgias, back pain, joint swelling, arthralgias and gait problem.  Skin: Denies pallor, rash and wound.  Neurological: Denies dizziness, seizures,  syncope, weakness, light-headedness, numbness and headaches.  Hematological: Denies adenopathy. Easy bruising, personal or family bleeding history  Psychiatric/Behavioral: Denies suicidal ideation, mood changes, confusion, nervousness, sleep disturbance and agitation    Physical Exam: Vitals:   04/24/20 1502  BP: 120/80  Pulse: (!) 104  Temp: 98.5 F (36.9 C)  TempSrc: Oral   SpO2: 97%  Weight: 285 lb 1.6 oz (129.3 kg)    Body mass index is 36.6 kg/m.   Constitutional: NAD, calm, comfortable Eyes: PERRL, lids and conjunctivae normal ENMT: Mucous membranes are moist.   Respiratory: clear to auscultation bilaterally, no wheezing, no crackles. Normal respiratory effort. No accessory muscle use.  Cardiovascular: Regular rate and rhythm, no murmurs / rubs / gallops. No extremity edema.  Neurologic: Grossly intact and nonfocal Psychiatric: Normal judgment and insight. Alert and oriented x 3. Normal mood.    Impression and Plan:  Elevated BP without diagnosis of hypertension -Blood pressure within range today, continue to follow.  He will make appointment for CPE. -Referral to GI for screening colonoscopy will be made today.   Patient Instructions  -Nice seeing you today!!  -Schedule follow up for your physical. Please come in fasting that day.     Chaya Jan, MD [facility]

## 2020-04-26 ENCOUNTER — Encounter: Payer: Self-pay | Admitting: Gastroenterology

## 2020-04-27 ENCOUNTER — Ambulatory Visit
Admission: EM | Admit: 2020-04-27 | Discharge: 2020-04-27 | Disposition: A | Discharge status: Home / Self Care | Payer: Medicare Other | Attending: Physician Assistant | Admitting: Physician Assistant

## 2020-04-27 ENCOUNTER — Other Ambulatory Visit: Payer: Self-pay

## 2020-04-27 DIAGNOSIS — S40862A Insect bite (nonvenomous) of left upper arm, initial encounter: Secondary | ICD-10-CM

## 2020-04-27 DIAGNOSIS — Z5189 Encounter for other specified aftercare: Secondary | ICD-10-CM

## 2020-04-27 MED ORDER — TRIAMCINOLONE ACETONIDE 0.1 % EX CREA: 1.0000 "application " | TOPICAL_CREAM | Freq: Two times a day (BID) | CUTANEOUS | 0 refills | Status: DC

## 2020-04-27 NOTE — ED Triage Notes (Signed)
Patient presents for insect bite on left arm that occurred yesterday while he was out walking.

## 2020-04-27 NOTE — ED Provider Notes (Signed)
[facility]    CSN: [id number] Arrival date & time: 04/27/20  0941      History   Chief Complaint Chief Complaint  Patient presents with  . Insect Bite    HPI KHAALID LEFKOWITZ Sr. is a 44 y.o. male.   44 year old male comes in for wound check after insect bite to left arm yesterday. There was swelling, erythema that improved. Did get some drainage to the bite site. Since then, swelling and erythema has improved. Denies pain. Has itching. otc topical medicine without relief.      Past Medical History:  Diagnosis Date  . Allergy   . Cataract   . Fibromyalgia   . Glaucoma     Patient Active Problem List   Diagnosis Date Noted  . Degenerative disc disease, cervical 02/29/2020  . Left hamstring muscle strain 10/16/2019  . Acute meniscal tear of knee, left, initial encounter 05/16/2019  . Ganglion cyst of finger of left hand 07/26/2018  . Finger sprain 05/19/2018  . Back pain 10/04/2017  . Nonallopathic lesion of lumbosacral region 10/04/2017  . Nonallopathic lesion of sacral region 10/04/2017  . Nonallopathic lesion of thoracic region 10/04/2017  . Patellofemoral arthritis of right knee 02/24/2016  . Degenerative arthritis of knee 02/24/2016  . Acute medial meniscal tear 11/21/2015  . Right knee pain 11/08/2015  . Pain of right heel 11/08/2015  . Right ear pain 04/23/2015  . Fibromyalgia 03/15/2015  . Glaucoma 03/15/2015  . Legally blind 03/15/2015    Past Surgical History:  Procedure Laterality Date  . EYE SURGERY    . HERNIA REPAIR         Home Medications    Prior to Admission medications   Medication Sig Start Date End Date Taking? Authorizing Provider  brimonidine (ALPHAGAN) 0.2 % ophthalmic solution Place 1 drop into both eyes 2 (two) times daily.     [provider]  dorzolamide-timolol (COSOPT) 22.3-6.8 MG/ML ophthalmic solution Place 1 drop into both eyes 2 (two) times daily.    [provider]  DULoxetine (CYMBALTA)  20 MG capsule Take 2 capsules (40 mg total) by mouth daily. 10/04/17   [name], DO  gabapentin (NEURONTIN) 100 MG capsule Take 2 capsules (200 mg total) by mouth at bedtime. 02/29/20   [name], DO  naproxen (NAPROSYN) 500 MG tablet Take 1 tablet (500 mg total) by mouth 2 (two) times daily. 12/28/19   Hall-Potvin, [place], PA-C  travoprost, benzalkonium, (TRAVATAN) 0.004 % ophthalmic solution Place 1 drop into both eyes at bedtime.    [provider]  triamcinolone cream (KENALOG) 0.1 % Apply 1 application topically 2 (two) times daily. 04/27/20   Cathie Hoops, Ritchie Klee V, PA-C  azelastine (ASTELIN) 0.1 % nasal spray Place 2 sprays into both nostrils 2 (two) times daily. 03/03/20 04/27/20  Belinda Fisher, PA-C    Family History Family History  Problem Relation Age of Onset  . Diabetes Mother   . Hypertension Mother   . Hyperlipidemia Father   . Hypertension Father   . Stroke Father   . Hypertension Brother   . Cancer Maternal Grandmother   . Cancer Paternal Grandfather     Social History Social History   Tobacco Use  . Smoking status: Never Smoker  . Smokeless tobacco: Never Used  Substance Use Topics  . Alcohol use: No  . Drug use: No     Allergies   Citrus and Latex   Review of Systems Review of Systems  Reason unable to perform ROS: See HPI as above.     Physical Exam Triage Vital Signs ED Triage Vitals  Enc Vitals Group     BP 04/27/20 1109 (!) 138/91     Pulse Rate 04/27/20 1109 80     Resp 04/27/20 1109 16     Temp 04/27/20 1109 98.3 F (36.8 C)     Temp Source 04/27/20 1109 Oral     SpO2 04/27/20 1109 97 %     Weight --      Height --      Head Circumference --      Peak Flow --      Pain Score 04/27/20 1150 0     Pain Loc --      Pain Edu? --      Excl. in GC? --    No data found.  Updated Vital Signs BP (!) 138/91 (BP Location: Left Arm)   Pulse 80   Temp 98.3 F (36.8 C) (Oral)   Resp 16   SpO2 97%   Physical Exam Constitutional:        General: He is not in acute distress.    Appearance: Normal appearance. He is well-developed. He is not toxic-appearing or diaphoretic.  HENT:     Head: Normocephalic and atraumatic.  Eyes:     Conjunctiva/sclera: Conjunctivae normal.     Pupils: Pupils are equal, round, and reactive to light.  Pulmonary:     Effort: Pulmonary effort is normal. No respiratory distress.  Musculoskeletal:     Cervical back: Normal range of motion and neck supple.  Skin:    General: Skin is warm and dry.     Comments: 1 maculopapular lesion to dorsal aspect of left forearm without active drainage. No erythema, warmth. Minimal surrounding induration. No tenderness, fluctuance.   Neurological:     Mental Status: He is alert and oriented to person, place, and time.      UC Treatments / Results  Labs (all labs ordered are listed, but only abnormal results are displayed) Labs Reviewed - No data to display  EKG   Radiology No results found.  Procedures Procedures (including critical care time)  Medications Ordered in UC Medications - No data to display  Initial Impression / Assessment and Plan / UC Course  I have reviewed the triage vital signs and the nursing notes.  Pertinent labs & imaging results that were available during my care of the patient were reviewed by me and considered in my medical decision making (see chart for details).    No signs of infection. Symptomatic treatment discussed. Return precautions given. Patient expresses understanding and agrees to plan.  Final Clinical Impressions(s) / UC Diagnoses   Final diagnoses:  Visit for wound check  Insect bite of left upper arm, initial encounter    ED Prescriptions    Medication Sig Dispense Auth. Provider   triamcinolone cream (KENALOG) 0.1 % Apply 1 application topically 2 (two) times daily. 30 g Belinda Fisher, PA-C     PDMP not reviewed this encounter.   Belinda Fisher, PA-C 04/27/20 1407

## 2020-04-27 NOTE — Discharge Instructions (Signed)
No signs of infection. Triamcinolone cream for itching. Ice compress to help with swelling. Monitor for spreading redness, warmth, fever, pain, follow up for reevaluation.

## 2020-05-27 ENCOUNTER — Encounter: Payer: Self-pay | Admitting: *Deleted

## 2020-05-30 ENCOUNTER — Ambulatory Visit (INDEPENDENT_AMBULATORY_CARE_PROVIDER_SITE_OTHER): Payer: Medicare Other | Admitting: Family Medicine

## 2020-05-30 ENCOUNTER — Encounter: Payer: Self-pay | Admitting: Family Medicine

## 2020-05-30 VITALS — BP 112/82 | HR 98 | Ht 74.0 in | Wt 285.0 lb

## 2020-05-30 DIAGNOSIS — M999 Biomechanical lesion, unspecified: Secondary | ICD-10-CM

## 2020-05-30 DIAGNOSIS — M5030 Other cervical disc degeneration, unspecified cervical region: Secondary | ICD-10-CM

## 2020-05-30 NOTE — Progress Notes (Signed)
[name] [address] [place] [address] Phone: [phone] Subjective:   Bryce [name], am serving as a scribe for Dr. Antoine Primas. This visit occurred during the SARS-CoV-2 public health emergency.  Safety protocols were in place, including screening questions prior to the visit, additional usage of staff PPE, and extensive cleaning of exam room while observing appropriate contact time as indicated for disinfecting solutions.   I'm seeing this patient by the request  of:  [name], [name], MD  CC:   [id number]  Bryce Pillar Sr. is a 44 y.o. male coming in with complaint of back and neck pain. OMT 04/18/2020. Patient states that his neck is tight due to pinched nerve. Discomfort in lumbar spine as well. Nothing new just worsening of previous at the moment   Medications patient has been prescribed: cymbalta, naproxen, gabpentin   Taking:yes          Reviewed prior external information including notes and imaging from previsou exam, outside providers and external EMR if available.   As well as notes that were available from care everywhere and other healthcare systems.  Past medical history, social, surgical and family history all reviewed in electronic medical record.  No pertanent information unless stated regarding to the chief complaint.   Past Medical History:  Diagnosis Date  . Allergy   . Cataract   . Fibromyalgia   . Glaucoma     Allergies  Allergen Reactions  . Citrus Anaphylaxis  . Other Hives  . Latex Rash     Review of Systems:  No headache, visual changes, nausea, vomiting, diarrhea, constipation, dizziness, abdominal pain, skin rash, fevers, chills, night sweats, weight loss, swollen lymph nodes, body aches, joint swelling, chest pain, shortness of breath, mood changes. POSITIVE muscle aches  Objective  Blood pressure 112/82, pulse 98, height 6\' 2"  (1.88 m), weight 285 lb (129.3 kg), SpO2 98 %.    General: No apparent distress alert and oriented x3 mood and affect normal, dressed appropriately.  HEENT: Pupils equal, extraocular movements intact  Respiratory: Patient's speak in full sentences and does not appear short of breath  Cardiovascular: No lower extremity edema, non tender, no erythema  Neuro: Cranial nerves II through XII are intact, neurovascularly intact in all extremities with 2+ DTRs and 2+ pulses.  Gait normal with good balance and coordination.  MSK:  Non tender with full range of motion and good stability and symmetric strength and tone of shoulders, elbows, wrist, hip, knee and ankles bilaterally.  Back - mild loss of lordosis,  Neck loss of lordosis, neg spurling but lack of sidebending and 5 degree of extension.   Osteopathic findings  C2 flexed rotated and side bent right C7 flexed rotated and side bent left T3 extended rotated and side bent right inhaled rib T9 extended rotated and side bent left L2 flexed rotated and side bent right Sacrum right on right       Assessment and Plan:  Degenerative disc disease, cervical Stable, mild worsening in tightness, discussed gabapentin, cymbalta naproxen.  rtc in 4-8 weeks     Nonallopathic problems  Decision today to treat with OMT was based on Physical Exam  After verbal consent patient was treated with HVLA, ME, FPR techniques in cervical, rib, thoracic, lumbar, and sacral  areas  Patient tolerated the procedure well with improvement in symptoms  Patient given exercises, stretches and lifestyle modifications  See medications in patient instructions if given  Patient will  follow up in 4-8 weeks      The above documentation has been reviewed and is accurate and complete [name], DO       Note: This dictation was prepared with Dragon dictation along with smaller phrase technology. Any transcriptional errors that result from this process are unintentional.

## 2020-05-30 NOTE — Patient Instructions (Signed)
Great to see you Glad we could work on the neck today See me in 6 weeks

## 2020-06-03 ENCOUNTER — Encounter: Payer: Self-pay | Admitting: Family Medicine

## 2020-06-03 NOTE — Assessment & Plan Note (Signed)
Stable, mild worsening in tightness, discussed gabapentin, cymbalta naproxen.  rtc in 4-8 weeks

## 2020-06-05 DIAGNOSIS — Z20822 Contact with and (suspected) exposure to covid-19: Secondary | ICD-10-CM | POA: Diagnosis not present

## 2020-06-05 DIAGNOSIS — Z03818 Encounter for observation for suspected exposure to other biological agents ruled out: Secondary | ICD-10-CM | POA: Diagnosis not present

## 2020-06-06 ENCOUNTER — Other Ambulatory Visit: Payer: Self-pay

## 2020-06-06 ENCOUNTER — Ambulatory Visit (AMBULATORY_SURGERY_CENTER): Payer: Self-pay | Admitting: *Deleted

## 2020-06-06 VITALS — Ht 74.0 in | Wt 281.0 lb

## 2020-06-06 DIAGNOSIS — Z1211 Encounter for screening for malignant neoplasm of colon: Secondary | ICD-10-CM

## 2020-06-06 MED ORDER — SUPREP BOWEL PREP KIT 17.5-3.13-1.6 GM/177ML PO SOLN: 1.0000 | Freq: Once | ORAL | 0 refills | Status: AC

## 2020-06-06 NOTE — Progress Notes (Signed)
Completed covid vaccines 10-09-19  Pt is aware that care partner will wait in the car during procedure; if they feel like they will be too hot or cold to wait in the car; they may wait in the 4 th floor lobby. Patient is aware to bring only one care partner. We want them to wear a mask (we do not have any that we can provide them), practice social distancing, and we will check their temperatures when they get here.  I did remind the patient that their care partner needs to stay in the parking lot the entire time and have a cell phone available, we will call them when the pt is ready for discharge. Patient will wear mask into building.   No trouble with anesthesia, difficulty with moving neck or hx/fam hx of malignant hyperthermia per pt   No egg or soy allergy  No home oxygen use   No medications for weight loss taken  emmi information given via MyChart  Pt denies constipation issues

## 2020-06-21 ENCOUNTER — Other Ambulatory Visit: Payer: Self-pay

## 2020-06-21 ENCOUNTER — Ambulatory Visit
Admission: EM | Admit: 2020-06-21 | Discharge: 2020-06-21 | Disposition: A | Discharge status: Home / Self Care | Payer: Medicare Other | Attending: Physician Assistant | Admitting: Physician Assistant

## 2020-06-21 ENCOUNTER — Telehealth: Payer: Self-pay | Admitting: Physician Assistant

## 2020-06-21 DIAGNOSIS — R059 Cough, unspecified: Secondary | ICD-10-CM

## 2020-06-21 DIAGNOSIS — Z20822 Contact with and (suspected) exposure to covid-19: Secondary | ICD-10-CM | POA: Diagnosis not present

## 2020-06-21 DIAGNOSIS — R21 Rash and other nonspecific skin eruption: Secondary | ICD-10-CM

## 2020-06-21 DIAGNOSIS — R0982 Postnasal drip: Secondary | ICD-10-CM

## 2020-06-21 MED ORDER — PROMETHAZINE-DM 6.25-15 MG/5ML PO SYRP: 5.0000 mL | ORAL_SOLUTION | Freq: Four times a day (QID) | ORAL | 0 refills | Status: AC | PRN

## 2020-06-21 MED ORDER — TRIAMCINOLONE ACETONIDE 0.1 % EX CREA: 1.0000 "application " | TOPICAL_CREAM | Freq: Two times a day (BID) | CUTANEOUS | 0 refills | Status: AC

## 2020-06-21 MED ORDER — PROMETHAZINE-DM 6.25-15 MG/5ML PO SYRP: 5.0000 mL | ORAL_SOLUTION | Freq: Four times a day (QID) | ORAL | 0 refills | Status: DC | PRN

## 2020-06-21 MED ORDER — TRIAMCINOLONE ACETONIDE 0.1 % EX CREA: 1.0000 "application " | TOPICAL_CREAM | Freq: Two times a day (BID) | CUTANEOUS | 0 refills | Status: DC

## 2020-06-21 NOTE — ED Triage Notes (Signed)
Pt c/o cough x2 day. States has been taking day/night Nyquil and Flonase with some relief but now has a rash under rt arm and lower abdomen.

## 2020-06-21 NOTE — Telephone Encounter (Signed)
eprescribe error due to system problem. Written Rx given.

## 2020-06-21 NOTE — ED Provider Notes (Signed)
[facility]    CSN: [id number] Arrival date & time: 06/21/20  0935      History   Chief Complaint Chief Complaint  Patient presents with  . Cough    HPI Bryce [name] Sr. is a 44 y.o. male.   44 year old male comes in for multiple complaints  1. 2 day of URI symptoms. Post nasal drainage, cough. Denies fever, chills, body aches. Denies shortness of breath, loss of taste/smell. Has been taking dayquil/nyquil, flonase with some relief. However, has had trouble sleeping due to cough.  2. Few day history of rash to the right axilla, RLQ abdomen. Occasional itching. No pain, new contact.      Past Medical History:  Diagnosis Date  . Allergy   . Anxiety   . Cataract   . Fibromyalgia   . Glaucoma     Patient Active Problem List   Diagnosis Date Noted  . Degenerative disc disease, cervical 02/29/2020  . Left hamstring muscle strain 10/16/2019  . Acute meniscal tear of knee, left, initial encounter 05/16/2019  . Ganglion cyst of finger of left hand 07/26/2018  . Finger sprain 05/19/2018  . Back pain 10/04/2017  . Nonallopathic lesion of lumbosacral region 10/04/2017  . Nonallopathic lesion of sacral region 10/04/2017  . Nonallopathic lesion of thoracic region 10/04/2017  . Patellofemoral arthritis of right knee 02/24/2016  . Degenerative arthritis of knee 02/24/2016  . Acute medial meniscal tear 11/21/2015  . Right knee pain 11/08/2015  . Pain of right heel 11/08/2015  . Right ear pain 04/23/2015  . Fibromyalgia 03/15/2015  . Glaucoma 03/15/2015  . Legally blind 03/15/2015    Past Surgical History:  Procedure Laterality Date  . EYE SURGERY     has had 9 surgeries for eyes- shunts/drains, artifical lens both eyes  . HERNIA REPAIR         Home Medications    Prior to Admission medications   Medication Sig Start Date End Date Taking? Authorizing Provider  brimonidine (ALPHAGAN) 0.2 % ophthalmic solution Place 1 drop into both eyes 2 (two)  times daily.     [provider]  dorzolamide-timolol (COSOPT) 22.3-6.8 MG/ML ophthalmic solution Place 1 drop into both eyes 2 (two) times daily.    [provider]  DULoxetine (CYMBALTA) 20 MG capsule Take 2 capsules (40 mg total) by mouth daily. 10/04/17   [name], DO  gabapentin (NEURONTIN) 100 MG capsule Take 2 capsules (200 mg total) by mouth at bedtime. 02/29/20   [name], DO  naproxen (NAPROSYN) 500 MG tablet Take 1 tablet (500 mg total) by mouth 2 (two) times daily. 12/28/19   Hall-Potvin, [place], PA-C  promethazine-dextromethorphan (PROMETHAZINE-DM) 6.25-15 MG/5ML syrup Take 5 mLs by mouth 4 (four) times daily as needed for cough. 06/21/20   Cathie Hoops, Doneisha Ivey V, PA-C  travoprost, benzalkonium, (TRAVATAN) 0.004 % ophthalmic solution Place 1 drop into both eyes at bedtime.    [provider]  triamcinolone cream (KENALOG) 0.1 % Apply 1 application topically 2 (two) times daily. 06/21/20   Cathie Hoops, Charles Niese V, PA-C  azelastine (ASTELIN) 0.1 % nasal spray Place 2 sprays into both nostrils 2 (two) times daily. 03/03/20 04/27/20  Belinda Fisher, PA-C    Family History Family History  Problem Relation Age of Onset  . Diabetes Mother   . Hypertension Mother   . Hyperlipidemia Father   . Hypertension Father   . Stroke Father   . Hypertension Brother   . Cancer Maternal  Grandmother   . Colon cancer Maternal Grandmother        4 uncles  . Cancer Paternal Grandfather   . Colon cancer Paternal Uncle   . Esophageal cancer Neg Hx   . Rectal cancer Neg Hx   . Stomach cancer Neg Hx     Social History Social History   Tobacco Use  . Smoking status: Never Smoker  . Smokeless tobacco: Never Used  Vaping Use  . Vaping Use: Never used  Substance Use Topics  . Alcohol use: No  . Drug use: No     Allergies   Citrus, Other, and Latex   Review of Systems Review of Systems  Reason unable to perform ROS: See HPI as above.     Physical Exam Triage Vital Signs ED  Triage Vitals [06/21/20 0952]  Enc Vitals Group     BP (!) 155/100     Pulse Rate 100     Resp 18     Temp 99.5 F (37.5 C)     Temp Source Oral     SpO2 94 %     Weight      Height      Head Circumference      Peak Flow      Pain Score 0     Pain Loc      Pain Edu?      Excl. in GC?    No data found.  Updated Vital Signs BP (!) 155/100 (BP Location: Left Arm)   Pulse 100   Temp 99.5 F (37.5 C) (Oral)   Resp 18   SpO2 94%   Physical Exam Constitutional:      General: He is not in acute distress.    Appearance: Normal appearance. He is not ill-appearing, toxic-appearing or diaphoretic.  HENT:     Head: Normocephalic and atraumatic.     Mouth/Throat:     Mouth: Mucous membranes are moist.     Pharynx: Oropharynx is clear. Uvula midline.  Cardiovascular:     Rate and Rhythm: Normal rate and regular rhythm.     Heart sounds: Normal heart sounds. No murmur heard.  No friction rub. No gallop.   Pulmonary:     Effort: Pulmonary effort is normal. No accessory muscle usage, prolonged expiration, respiratory distress or retractions.     Comments: Lungs clear to auscultation without adventitious lung sounds. Musculoskeletal:     Cervical back: Normal range of motion and neck supple.  Skin:    General: Skin is warm and dry.     Comments: Few maculopapular rash in circular pattern without central clearing to the right axilla, right flexor upper arm, right lower abdomen. No erythema, warmth. No tenderness  Neurological:     General: No focal deficit present.     Mental Status: He is alert and oriented to person, place, and time.      UC Treatments / Results  Labs (all labs ordered are listed, but only abnormal results are displayed) Labs Reviewed  NOVEL CORONAVIRUS, NAA    EKG   Radiology No results found.  Procedures Procedures (including critical care time)  Medications Ordered in UC Medications - No data to display  Initial Impression / Assessment and  Plan / UC Course  I have reviewed the triage vital signs and the nursing notes.  Pertinent labs & imaging results that were available during my care of the patient were reviewed by me and considered in my medical decision making (see chart for  details).    1. URI symptoms COVID PCR test ordered. Patient to quarantine until testing results return. No alarming signs on exam. LCTAB. Symptomatic treatment discussed.  Push fluids.  Return precautions given.  Patient expresses understanding and agrees to plan.  2. Rash ?folliculitis vs eczema given location. Will provide trial of triamcinolone cream. PCP follow up if symptoms not improving. Return precautions given.  Final Clinical Impressions(s) / UC Diagnoses   Final diagnoses:  Encounter for screening laboratory testing for COVID-19 virus  Cough  Post-nasal drip  Rash    ED Prescriptions    Medication Sig Dispense Auth. Provider   promethazine-dextromethorphan (PROMETHAZINE-DM) 6.25-15 MG/5ML syrup Take 5 mLs by mouth 4 (four) times daily as needed for cough. 118 mL Mikalah Skyles V, PA-C   triamcinolone cream (KENALOG) 0.1 % Apply 1 application topically 2 (two) times daily. 30 g Belinda Fisher, PA-C     PDMP not reviewed this encounter.   Belinda Fisher, PA-C 06/21/20 1032

## 2020-06-21 NOTE — Discharge Instructions (Addendum)
Cough COVID PCR testing ordered. I would like you to quarantine until testing results. Cough syrup as needed. Continue flonase as directed. Tylenol/motrin for pain and fever. Keep hydrated, urine should be clear to pale yellow in color. If experiencing shortness of breath, trouble breathing, go to the emergency department for further evaluation needed.   Rash  As discussed, this could be in grown hair follicles, eczema, contact irritation. Start triamcinolone as needed. Follow up with PCP if symptoms not improving/resolving.

## 2020-06-22 LAB — NOVEL CORONAVIRUS, NAA: SARS-CoV-2, NAA: NOT DETECTED

## 2020-06-22 LAB — SARS-COV-2, NAA 2 DAY TAT

## 2020-06-27 ENCOUNTER — Encounter: Payer: Self-pay | Admitting: Gastroenterology

## 2020-06-27 ENCOUNTER — Ambulatory Visit (AMBULATORY_SURGERY_CENTER): Payer: Medicare Other | Admitting: Gastroenterology

## 2020-06-27 ENCOUNTER — Other Ambulatory Visit: Payer: Self-pay

## 2020-06-27 VITALS — BP 147/101 | HR 70 | Temp 98.0°F | Resp 17 | Ht 74.0 in | Wt 281.0 lb

## 2020-06-27 DIAGNOSIS — Z800 Family history of malignant neoplasm of digestive organs: Secondary | ICD-10-CM

## 2020-06-27 DIAGNOSIS — Z1211 Encounter for screening for malignant neoplasm of colon: Secondary | ICD-10-CM

## 2020-06-27 MED ORDER — SODIUM CHLORIDE 0.9 % IV SOLN: 500.0000 mL | INTRAVENOUS | Status: DC

## 2020-06-27 NOTE — Patient Instructions (Signed)
Please read handouts provided. Continue present medications.   YOU HAD AN ENDOSCOPIC PROCEDURE TODAY AT THE [facility]:   Refer to the procedure report that was given to you for any specific questions about what was found during the examination.  If the procedure report does not answer your questions, please call your gastroenterologist to clarify.  If you requested that your care partner not be given the details of your procedure findings, then the procedure report has been included in a sealed envelope for you to review at your convenience later.  YOU SHOULD EXPECT: Some feelings of bloating in the abdomen. Passage of more gas than usual.  Walking can help get rid of the air that was put into your GI tract during the procedure and reduce the bloating. If you had a lower endoscopy (such as a colonoscopy or flexible sigmoidoscopy) you may notice spotting of blood in your stool or on the toilet paper. If you underwent a bowel prep for your procedure, you may not have a normal bowel movement for a few days.  Please Note:  You might notice some irritation and congestion in your nose or some drainage.  This is from the oxygen used during your procedure.  There is no need for concern and it should clear up in a day or so.  SYMPTOMS TO REPORT IMMEDIATELY:  Following lower endoscopy (colonoscopy or flexible sigmoidoscopy):  Excessive amounts of blood in the stool  Significant tenderness or worsening of abdominal pains  Swelling of the abdomen that is new, acute  Fever of 100F or higher   For urgent or emergent issues, a gastroenterologist can be reached at any hour by calling [phone]. Do not use MyChart messaging for urgent concerns.    DIET:  We do recommend a small meal at first, but then you may proceed to your regular diet.  Drink plenty of fluids but you should avoid alcoholic beverages for 24 hours.  ACTIVITY:  You should plan to take it easy for the rest of today and  you should NOT DRIVE or use heavy machinery until tomorrow (because of the sedation medicines used during the test).    FOLLOW UP: Our staff will call the number listed on your records 48-72 hours following your procedure to check on you and address any questions or concerns that you may have regarding the information given to you following your procedure. If we do not reach you, we will leave a message.  We will attempt to reach you two times.  During this call, we will ask if you have developed any symptoms of COVID 19. If you develop any symptoms (ie: fever, flu-like symptoms, shortness of breath, cough etc.) before then, please call [phone].  If you test positive for Covid 19 in the 2 weeks post procedure, please call and report this information to us.    If any biopsies were taken you will be contacted by phone or by letter within the next 1-3 weeks.  Please call us at [phone] if you have not heard about the biopsies in 3 weeks.    SIGNATURES/CONFIDENTIALITY: You and/or your care partner have signed paperwork which will be entered into your electronic medical record.  These signatures attest to the fact that that the information above on your After Visit Summary has been reviewed and is understood.  Full responsibility of the confidentiality of this discharge information lies with you and/or your care-partner.  

## 2020-06-27 NOTE — Progress Notes (Signed)
A/ox3, pleased with MAC, report to RN 

## 2020-06-27 NOTE — Op Note (Signed)
[facility] Patient Name: Bryce [name] Procedure Date: 06/27/2020 11:59 AM MRN: [id number] Endoscopist: Viviann Spare P. Adela Lank , MD Age: 44 Referring MD:  Date of Birth: 07/18/1976 Gender: Male Account #: [id number] Procedure:                Colonoscopy Indications:              Colon cancer screening in patient at increased                            risk: Family history of colorectal cancer in                            multiple 2nd degree relatives (2 or 3 uncles with                            colon cancer on father's side of the family), This                            is the patient's first colonoscopy Medicines:                Monitored Anesthesia Care Procedure:                Pre-Anesthesia Assessment:                           - Prior to the procedure, a History and Physical                            was performed, and patient medications and                            allergies were reviewed. The patient's tolerance of                            previous anesthesia was also reviewed. The risks                            and benefits of the procedure and the sedation                            options and risks were discussed with the patient.                            All questions were answered, and informed consent                            was obtained. Prior Anticoagulants: The patient has                            taken no previous anticoagulant or antiplatelet                            agents. ASA Grade Assessment: II - A patient with  mild systemic disease. After reviewing the risks                            and benefits, the patient was deemed in                            satisfactory condition to undergo the procedure.                           After obtaining informed consent, the colonoscope                            was passed under direct vision. Throughout the                            procedure, the patient's blood  pressure, pulse, and                            oxygen saturations were monitored continuously. The                            Colonoscope was introduced through the anus and                            advanced to the the cecum, identified by                            appendiceal orifice and ileocecal valve. The                            colonoscopy was performed without difficulty. The                            patient tolerated the procedure well. The quality                            of the bowel preparation was good. The ileocecal                            valve, appendiceal orifice, and rectum were                            photographed. Scope In: 12:11:46 PM Scope Out: 12:29:13 PM Scope Withdrawal Time: 0 hours 15 minutes 28 seconds  Total Procedure Duration: 0 hours 17 minutes 27 seconds  Findings:                 The perianal and digital rectal examinations were                            normal.                           A few medium-mouthed diverticula were found in the  sigmoid colon.                           Internal hemorrhoids were found during                            retroflexion. The hemorrhoids were small.                           The exam was otherwise without abnormality. Complications:            No immediate complications. Estimated blood loss:                            None. Estimated Blood Loss:     Estimated blood loss: none. Impression:               - Diverticulosis in the sigmoid colon.                           - Internal hemorrhoids.                           - The examination was otherwise normal.                           - No polyps Recommendation:           - Patient has a contact number available for                            emergencies. The signs and symptoms of potential                            delayed complications were discussed with the                            patient. Return to normal activities  tomorrow.                            Written discharge instructions were provided to the                            patient.                           - Resume previous diet.                           - Continue present medications.                           - Repeat colonoscopy in 10 years for screening                            purposes. Viviann Spare P. Emmry Hinsch, MD 06/27/2020 12:33:06 PM This report has been signed electronically.

## 2020-07-01 ENCOUNTER — Telehealth: Payer: Self-pay | Admitting: *Deleted

## 2020-07-01 NOTE — Telephone Encounter (Signed)
°  Follow up Call-  Call back number 06/27/2020  Post procedure Call Back phone  # [phone]  Permission to leave phone message Yes  Some recent data might be hidden     Patient questions:  Do you have a fever, pain , or abdominal swelling? No. Pain Score  0 *  Have you tolerated food without any problems? Yes.    Have you been able to return to your normal activities? Yes.    Do you have any questions about your discharge instructions: Diet   No. Medications  No. Follow up visit  No.  Do you have questions or concerns about your Care? No.  Actions: * If pain score is 4 or above: No action needed, pain <4.  1. Have you developed a fever since your procedure? no  2.   Have you had an respiratory symptoms (SOB or cough) since your procedure? no  3.   Have you tested positive for COVID 19 since your procedure no  4.   Have you had any family members/close contacts diagnosed with the COVID 19 since your procedure? no   If yes to any of these questions please route to Laverna Peace, RN and Karlton Lemon, RN

## 2020-07-11 ENCOUNTER — Ambulatory Visit (INDEPENDENT_AMBULATORY_CARE_PROVIDER_SITE_OTHER): Payer: Medicare Other | Admitting: Family Medicine

## 2020-07-11 ENCOUNTER — Other Ambulatory Visit: Payer: Self-pay

## 2020-07-11 ENCOUNTER — Encounter: Payer: Self-pay | Admitting: Family Medicine

## 2020-07-11 VITALS — BP 116/82 | HR 94 | Ht 74.0 in | Wt 276.0 lb

## 2020-07-11 DIAGNOSIS — M5030 Other cervical disc degeneration, unspecified cervical region: Secondary | ICD-10-CM | POA: Diagnosis not present

## 2020-07-11 DIAGNOSIS — M999 Biomechanical lesion, unspecified: Secondary | ICD-10-CM

## 2020-07-11 NOTE — Patient Instructions (Signed)
Exercises 3x a week See me again in 5 weeks

## 2020-07-11 NOTE — Assessment & Plan Note (Signed)
Known degenerative disc disease.  Likely some mild exacerbation.  Patient is on gabapentin and encourage patient to continue to increase.  Patient will follow up with me again in 6 weeks.

## 2020-07-11 NOTE — Progress Notes (Signed)
[name] [address] [place] [address] Phone: [phone] Subjective:   [name], am serving as a scribe for Dr. Antoine Primas. This visit occurred during the SARS-CoV-2 public health emergency.  Safety protocols were in place, including screening questions prior to the visit, additional usage of staff PPE, and extensive cleaning of exam room while observing appropriate contact time as indicated for disinfecting solutions.   I'm seeing this patient by the request  of:  [name], [name], MD  CC: Back and neck pain follow-up  [id number]  ARNEZ STONEKING Sr. is a 44 y.o. male coming in with complaint of back and neck pain. OMT 05/30/2020. Patient states that his thoracic spine pain and left shoulder pain have increased. Patient moved to a house in [place].   Medications patient has been prescribed: Gabapentin  Taking: Yes         Reviewed prior external information including notes and imaging from previsou exam, outside providers and external EMR if available.   As well as notes that were available from care everywhere and other healthcare systems.  Past medical history, social, surgical and family history all reviewed in electronic medical record.  No pertanent information unless stated regarding to the chief complaint.   Past Medical History:  Diagnosis Date  . Allergy   . Anxiety   . Cataract   . Fibromyalgia   . Glaucoma   . Legally blind     Allergies  Allergen Reactions  . Citrus Anaphylaxis  . Other Hives  . Latex Rash    welts     Review of Systems:  No headache, visual changes, nausea, vomiting, diarrhea, constipation, dizziness, abdominal pain, skin rash, fevers, chills, night sweats, weight loss, swollen lymph nodes, body aches, joint swelling, chest pain, shortness of breath, mood changes. POSITIVE muscle aches  Objective  Blood pressure 116/82, pulse 94, height 6\' 2"  (1.88 m), weight 276  lb (125.2 kg), SpO2 98 %.   General: No apparent distress alert and oriented x3 mood and affect normal, dressed appropriately.  HEENT: Pupils equal, extraocular movements intact  Respiratory: Patient's speak in full sentences and does not appear short of breath  Cardiovascular: No lower extremity edema, non tender, no erythema  MSK: Mild arthritic changes of multiple joints Neck exam shows significant more stiffness than usual.  Pain seems to be on the left side compared to the right.  Osteopathic findings  C7 flexed rotated and side bent left T3 extended rotated and side bent left  T9 extended rotated and side bent left L2 flexed rotated and side bent right Sacrum right on right       Assessment and Plan:  Degenerative disc disease, cervical Known degenerative disc disease.  Likely some mild exacerbation.  Patient is on gabapentin and encourage patient to continue to increase.  Patient will follow up with me again in 6 weeks.    Nonallopathic problems  Decision today to treat with OMT was based on Physical Exam  After verbal consent patient was treated with HVLA, ME, FPR techniques in cervical,  thoracic, lumbar, and sacral  areas  Patient tolerated the procedure well with improvement in symptoms  Patient given exercises, stretches and lifestyle modifications  See medications in patient instructions if given  Patient will follow up in 4-8 weeks      The above documentation has been reviewed and is accurate and complete , DO       Note: This  dictation was prepared with Dragon dictation along with smaller phrase technology. Any transcriptional errors that result from this process are unintentional.

## 2020-07-15 DIAGNOSIS — H401133 Primary open-angle glaucoma, bilateral, severe stage: Secondary | ICD-10-CM | POA: Diagnosis not present

## 2020-08-09 DIAGNOSIS — Z23 Encounter for immunization: Secondary | ICD-10-CM | POA: Diagnosis not present

## 2020-08-14 DIAGNOSIS — Z6834 Body mass index (BMI) 34.0-34.9, adult: Secondary | ICD-10-CM | POA: Diagnosis not present

## 2020-08-14 DIAGNOSIS — H401113 Primary open-angle glaucoma, right eye, severe stage: Secondary | ICD-10-CM | POA: Diagnosis not present

## 2020-08-14 DIAGNOSIS — E669 Obesity, unspecified: Secondary | ICD-10-CM | POA: Diagnosis not present

## 2020-08-15 ENCOUNTER — Ambulatory Visit: Payer: Medicare Other | Admitting: Family Medicine

## 2020-08-16 ENCOUNTER — Encounter: Payer: Medicare Other | Admitting: Internal Medicine

## 2020-08-29 ENCOUNTER — Ambulatory Visit: Payer: Medicare Other | Admitting: Family Medicine

## 2020-09-11 NOTE — Progress Notes (Unsigned)
[name] [address] [place] [address] Phone: [phone] Subjective:   I Bryce [name] am serving as a Neurosurgeon for Dr. Antoine Primas.  This visit occurred during the SARS-CoV-2 public health emergency.  Safety protocols were in place, including screening questions prior to the visit, additional usage of staff PPE, and extensive cleaning of exam room while observing appropriate contact time as indicated for disinfecting solutions.   I'm seeing this patient by the request  of:  [name], [name], MD  CC: Back and neck pain follow-up  [id number]  ANNETTE LIOTTA Sr. is a 45 y.o. male coming in with complaint of back and neck pain. OMT 07/11/2020. Patient states that he has had better days.  Patient is noticing more tightness.  Has been sometime since we have seen him.  Seems to be more in the low back.  Not as much as his neck at the moment.  No radicular symptoms just tightness.  Patient did have a surgery for his glaucoma and states that that has been helping.          Reviewed prior external information including notes and imaging from previsou exam, outside providers and external EMR if available.   As well as notes that were available from care everywhere and other healthcare systems.  Past medical history, social, surgical and family history all reviewed in electronic medical record.  No pertanent information unless stated regarding to the chief complaint.   Past Medical History:  Diagnosis Date  . Allergy   . Anxiety   . Cataract   . Fibromyalgia   . Glaucoma   . Legally blind     Allergies  Allergen Reactions  . Citrus Anaphylaxis  . Other Hives  . Latex Rash    welts     Review of Systems:  No headache, visual changes, nausea, vomiting, diarrhea, constipation, dizziness, abdominal pain, skin rash, fevers, chills, night sweats, weight loss, swollen lymph nodes, body aches, joint swelling, chest pain, shortness  of breath, mood changes. POSITIVE muscle aches  Objective  Blood pressure (!) 142/90, pulse 86, height 6\' 2"  (1.88 m), weight 269 lb (122 kg), SpO2 96 %.   General: No apparent distress alert and oriented x3 mood and affect normal, dressed appropriately.  HEENT: Pupils equal, extraocular movements intact  Respiratory: Patient's speak in full sentences and does not appear short of breath  Cardiovascular: No lower extremity edema, non tender, no erythema  Neuro: Cranial nerves II through XII are intact, neurovascularly intact in all extremities with 2+ DTRs and 2+ pulses.  Gait normal with good balance and coordination.  MSK:  Non tender with full range of motion and good stability and symmetric strength and tone of shoulders, elbows, wrist, hip, knee and ankles bilaterally.  Back -low back exam shows tightness in the thoracolumbar junction.  Mild tightness of the sacroiliac joint bilaterally.  No pain with internal rotation of the hips.  Neurovascularly intact distally.  Osteopathic findings   T5 extended rotated and side bent left L3 flexed rotated and side bent right Sacrum right on right       Assessment and Plan: Back pain Multifactorial but seems to be doing relatively well.  Mild exacerbation.  No significant change in medications.  Continue on the Cymbalta to 40 mg and can continue the gabapentin.  Patient seems to be doing better.  Discussed icing regimen and home exercises.  Increase activity slowly.  Follow-up with me again in 6  weeks Patient's previous imaging showed very minimal arthritic changes.   Nonallopathic problems  Decision today to treat with OMT was based on Physical Exam  After verbal consent patient was treated with HVLA, ME, FPR techniques in  thoracic, lumbar, and sacral  areas  Patient tolerated the procedure well with improvement in symptoms  Patient given exercises, stretches and lifestyle modifications  See medications in patient instructions if  given  Patient will follow up in 5-6 weeks      The above documentation has been reviewed and is accurate and complete [name], DO       Note: This dictation was prepared with Dragon dictation along with smaller phrase technology. Any transcriptional errors that result from this process are unintentional.

## 2020-09-12 ENCOUNTER — Ambulatory Visit (INDEPENDENT_AMBULATORY_CARE_PROVIDER_SITE_OTHER): Payer: Medicare Other | Admitting: Family Medicine

## 2020-09-12 ENCOUNTER — Other Ambulatory Visit: Payer: Self-pay

## 2020-09-12 ENCOUNTER — Encounter: Payer: Self-pay | Admitting: Family Medicine

## 2020-09-12 VITALS — BP 142/90 | HR 86 | Ht 74.0 in | Wt 269.0 lb

## 2020-09-12 DIAGNOSIS — G8929 Other chronic pain: Secondary | ICD-10-CM

## 2020-09-12 DIAGNOSIS — M545 Low back pain, unspecified: Secondary | ICD-10-CM

## 2020-09-12 DIAGNOSIS — M5030 Other cervical disc degeneration, unspecified cervical region: Secondary | ICD-10-CM | POA: Diagnosis not present

## 2020-09-12 DIAGNOSIS — M999 Biomechanical lesion, unspecified: Secondary | ICD-10-CM

## 2020-09-12 NOTE — Assessment & Plan Note (Addendum)
Multifactorial but seems to be doing relatively well.  Mild exacerbation.  No significant change in medications.  Continue on the Cymbalta to 40 mg and can continue the gabapentin.  Patient seems to be doing better.  Discussed icing regimen and home exercises.  Increase activity slowly.  Follow-up with me again in 6 weeks

## 2020-09-12 NOTE — Patient Instructions (Addendum)
Happy New Year See me in 6 weeks

## 2020-10-10 DIAGNOSIS — H401133 Primary open-angle glaucoma, bilateral, severe stage: Secondary | ICD-10-CM | POA: Diagnosis not present

## 2020-10-23 NOTE — Progress Notes (Signed)
[name] [address] [place] [address] Phone: [phone] Subjective:   Bryce [name], am serving as a scribe for Dr. Antoine Primas. This visit occurred during the SARS-CoV-2 public health emergency.  Safety protocols were in place, including screening questions prior to the visit, additional usage of staff PPE, and extensive cleaning of exam room while observing appropriate contact time as indicated for disinfecting solutions.   I'm seeing this patient by the request  of:  [name], [name], MD  CC: Back and neck pain  Bryce  Bryce GERVASI Sr. is a 45 y.o. male coming in with complaint of back and neck pain. OMT 09/12/2020. Patient states that he has not had any issues since last visit.  Patient has been doing relatively well overall.  Patient has lost weight.  Has been trying to work out more regularly and has cut out sugary drinks.  Medications patient has been prescribed: None           Reviewed prior external information including notes and imaging from previsou exam, outside providers and external EMR if available.   As well as notes that were available from care everywhere and other healthcare systems.  Past medical history, social, surgical and family history all reviewed in electronic medical record.  No pertanent information unless stated regarding to the chief complaint.   Past Medical History:  Diagnosis Date  . Allergy   . Anxiety   . Cataract   . Fibromyalgia   . Glaucoma   . Legally blind     Allergies  Allergen Reactions  . Citrus Anaphylaxis  . Other Hives  . Latex Rash    welts     Review of Systems:  No headache, visual changes, nausea, vomiting, diarrhea, constipation, dizziness, abdominal pain, skin rash, fevers, chills, night sweats, weight loss, swollen lymph nodes, body aches, joint swelling, chest pain, shortness of breath, mood changes. POSITIVE muscle aches  Objective  Blood  pressure (!) 128/98, pulse 79, height 6\' 2"  (1.88 m), weight 261 lb (118.4 kg), SpO2 97 %.   General: No apparent distress alert and oriented x3 mood and affect normal, dressed appropriately.  HEENT: Pupils equal, extraocular movements intact  Respiratory: Patient's speak in full sentences and does not appear short of breath  Cardiovascular: No lower extremity edema, non tender, no erythema  Gait normal with good balance and coordination.  Neck exam does have some mild loss of lordosis, tightness noted.  Osteopathic findings  C2 flexed rotated and side bent right T3 extended rotated and side bent right inhaled rib T9 extended rotated and side bent left L2 flexed rotated and side bent right Sacrum right on right       Assessment and Plan:  No problem-specific Assessment & Plan notes found for this encounter.    Nonallopathic problems  Decision today to treat with OMT was based on Physical Exam  After verbal consent patient was treated with HVLA, ME, FPR techniques in cervical, rib, thoracic, lumbar, and sacral  areas  Patient tolerated the procedure well with improvement in symptoms  Patient given exercises, stretches and lifestyle modifications  See medications in patient instructions if given  Patient will follow up in 4-8 weeks      The above documentation has been reviewed and is accurate and complete , DO       Note: This dictation was prepared with Dragon dictation along with smaller phrase technology. Any transcriptional errors that result  from this process are unintentional.

## 2020-10-24 ENCOUNTER — Ambulatory Visit (INDEPENDENT_AMBULATORY_CARE_PROVIDER_SITE_OTHER): Payer: Medicare Other | Admitting: Family Medicine

## 2020-10-24 ENCOUNTER — Other Ambulatory Visit: Payer: Self-pay

## 2020-10-24 ENCOUNTER — Encounter: Payer: Medicare Other | Admitting: Internal Medicine

## 2020-10-24 ENCOUNTER — Encounter: Payer: Self-pay | Admitting: Family Medicine

## 2020-10-24 VITALS — BP 128/98 | HR 79 | Ht 74.0 in | Wt 261.0 lb

## 2020-10-24 DIAGNOSIS — M5030 Other cervical disc degeneration, unspecified cervical region: Secondary | ICD-10-CM | POA: Diagnosis not present

## 2020-10-24 DIAGNOSIS — M999 Biomechanical lesion, unspecified: Secondary | ICD-10-CM

## 2020-10-24 NOTE — Patient Instructions (Signed)
Look at refacing Keep it up with weight loss See me in 5-6 weeks

## 2020-10-24 NOTE — Assessment & Plan Note (Signed)
Known degenerative disc disease but is doing very well.  History of fibromyalgia and responding well to the osteopathic manipulation as well as the Cymbalta.  Gabapentin can be taken at night as well.  Increase the home exercises as tolerated.  Encouraged him to continue to work on the weight loss.  Follow-up again 6 weeks

## 2020-10-28 DIAGNOSIS — H401130 Primary open-angle glaucoma, bilateral, stage unspecified: Secondary | ICD-10-CM | POA: Diagnosis not present

## 2020-10-28 DIAGNOSIS — M797 Fibromyalgia: Secondary | ICD-10-CM | POA: Diagnosis not present

## 2020-10-28 DIAGNOSIS — Z13228 Encounter for screening for other metabolic disorders: Secondary | ICD-10-CM | POA: Diagnosis not present

## 2020-10-28 DIAGNOSIS — Z13220 Encounter for screening for lipoid disorders: Secondary | ICD-10-CM | POA: Diagnosis not present

## 2020-10-28 DIAGNOSIS — L309 Dermatitis, unspecified: Secondary | ICD-10-CM | POA: Diagnosis not present

## 2020-10-28 DIAGNOSIS — M5030 Other cervical disc degeneration, unspecified cervical region: Secondary | ICD-10-CM | POA: Diagnosis not present

## 2020-11-12 DIAGNOSIS — L239 Allergic contact dermatitis, unspecified cause: Secondary | ICD-10-CM | POA: Diagnosis not present

## 2020-11-12 DIAGNOSIS — T781XXD Other adverse food reactions, not elsewhere classified, subsequent encounter: Secondary | ICD-10-CM | POA: Diagnosis not present

## 2020-11-12 DIAGNOSIS — J300 Vasomotor rhinitis: Secondary | ICD-10-CM | POA: Diagnosis not present

## 2020-11-26 DIAGNOSIS — L239 Allergic contact dermatitis, unspecified cause: Secondary | ICD-10-CM | POA: Diagnosis not present

## 2020-11-28 DIAGNOSIS — L239 Allergic contact dermatitis, unspecified cause: Secondary | ICD-10-CM | POA: Diagnosis not present

## 2020-11-28 NOTE — Progress Notes (Signed)
[name] [address] [place] [address] Phone: [phone] Subjective:   Bryce [name], am serving as a scribe for Dr. Antoine Primas. This visit occurred during the SARS-CoV-2 public health emergency.  Safety protocols were in place, including screening questions prior to the visit, additional usage of staff PPE, and extensive cleaning of exam room while observing appropriate contact time as indicated for disinfecting solutions.   I'm seeing this patient by the request  of:  [name], [name], MD  CC: Low back pain  [id number]  Bryce DULUDE Sr. is a 45 y.o. male coming in with complaint of back and neck pain. OMT 10/24/2020. Patient states that his upper back is tight. No new injury. Patient complains of neck pain. States that he is not sleeping well.  Patient states overall has been doing relatively well.  No doing a lot of more activity around the house.  Patient has multiple different projects that has been helpful.          Reviewed prior external information including notes and imaging from previsou exam, outside providers and external EMR if available.   As well as notes that were available from care everywhere and other healthcare systems.  Past medical history, social, surgical and family history all reviewed in electronic medical record.  No pertanent information unless stated regarding to the chief complaint.   Past Medical History:  Diagnosis Date  . Allergy   . Anxiety   . Cataract   . Fibromyalgia   . Glaucoma   . Legally blind     Allergies  Allergen Reactions  . Citrus Anaphylaxis  . Other Hives  . Latex Rash    welts     Review of Systems:  No headache, visual changes, nausea, vomiting, diarrhea, constipation, dizziness, abdominal pain, skin rash, fevers, chills, night sweats, weight loss, swollen lymph nodes, body aches, joint swelling, chest pain, shortness of breath, mood changes. POSITIVE  muscle aches  Objective  Blood pressure (!) 118/98, pulse 92, height 6\' 2"  (1.88 m), weight 258 lb (117 kg), SpO2 98 %.   General: No apparent distress alert and oriented x3 mood and affect normal, dressed appropriately.  HEENT: Pupils equal, extraocular movements intact  Respiratory: Patient's speak in full sentences and does not appear short of breath  Cardiovascular: No lower extremity edema, non tender, no erythema  Gait normal with good balance and coordination.  MSK:  Non tender with full range of motion and good stability and symmetric strength and tone of shoulders, elbows, wrist, hip, knee and ankles bilaterally.  Back -back exam does have some loss of lordosis.  Some tenderness to palpation of the paraspinal musculature.  5 out of 5 strength of the lower extremities.  Mild improvement in core strength.  Osteopathic findings   T8 extended rotated and side bent left L4 flexed rotated and side bent left Sacrum right on right       Assessment and Plan:  Back pain Chronic problem.  Seems to be doing relatively better though with patient actually making progress with losing weight.  Encouraged him to continue to do so.  Goal weight would be nearing 230 pounds.  Encouraged him to continue to stay active.  No significant medication changes.  Follow-up with me again in 6 weeks    Nonallopathic problems  Decision today to treat with OMT was based on Physical Exam  After verbal consent patient was treated with HVLA, ME, FPR techniques in  thoracic, lumbar, and sacral  areas  Patient tolerated the procedure well with improvement in symptoms  Patient given exercises, stretches and lifestyle modifications  See medications in patient instructions if given  Patient will follow up in 4-8 weeks      The above documentation has been reviewed and is accurate and complete [name], DO       Note: This dictation was prepared with Dragon dictation along with smaller  phrase technology. Any transcriptional errors that result from this process are unintentional.

## 2020-12-03 ENCOUNTER — Other Ambulatory Visit: Payer: Self-pay

## 2020-12-03 ENCOUNTER — Encounter: Payer: Self-pay | Admitting: Family Medicine

## 2020-12-03 ENCOUNTER — Ambulatory Visit (INDEPENDENT_AMBULATORY_CARE_PROVIDER_SITE_OTHER): Payer: Medicare Other | Admitting: Family Medicine

## 2020-12-03 VITALS — BP 118/98 | HR 92 | Ht 74.0 in | Wt 258.0 lb

## 2020-12-03 DIAGNOSIS — M999 Biomechanical lesion, unspecified: Secondary | ICD-10-CM

## 2020-12-03 DIAGNOSIS — L239 Allergic contact dermatitis, unspecified cause: Secondary | ICD-10-CM | POA: Diagnosis not present

## 2020-12-03 DIAGNOSIS — J300 Vasomotor rhinitis: Secondary | ICD-10-CM | POA: Diagnosis not present

## 2020-12-03 DIAGNOSIS — T781XXD Other adverse food reactions, not elsewhere classified, subsequent encounter: Secondary | ICD-10-CM | POA: Diagnosis not present

## 2020-12-03 DIAGNOSIS — G8929 Other chronic pain: Secondary | ICD-10-CM | POA: Diagnosis not present

## 2020-12-03 DIAGNOSIS — M545 Low back pain, unspecified: Secondary | ICD-10-CM | POA: Diagnosis not present

## 2020-12-03 NOTE — Assessment & Plan Note (Signed)
Chronic problem.  Seems to be doing relatively better though with patient actually making progress with losing weight.  Encouraged him to continue to do so.  Goal weight would be nearing 230 pounds.  Encouraged him to continue to stay active.  No significant medication changes.  Follow-up with me again in 6 weeks

## 2020-12-03 NOTE — Patient Instructions (Signed)
Good to see you Good luck with the house See me again in 6-7 weeks

## 2020-12-09 DIAGNOSIS — H401133 Primary open-angle glaucoma, bilateral, severe stage: Secondary | ICD-10-CM | POA: Diagnosis not present

## 2021-01-13 NOTE — Progress Notes (Signed)
[name] [address] [place] [address] Phone: [phone] Subjective:   I [name] am serving as a Neurosurgeon for Dr. Antoine Primas.  This visit occurred during the SARS-CoV-2 public health emergency.  Safety protocols were in place, including screening questions prior to the visit, additional usage of staff PPE, and extensive cleaning of exam room while observing appropriate contact time as indicated for disinfecting solutions.   I'm seeing this patient by the request  of:  Henderson Cloud, MD  CC: Multiple complaints today  [id number]  Bryce LIMES Sr. is a 45 y.o. male coming in with complaint of back, left foot, right elbow and neck pain. OMT 12/03/2020. Patient states TTP on the lateral left foot. Painful with inversion. With right shoulder flexion he has numbness in his arm.    Medications patient has been prescribed: None          Reviewed prior external information including notes and imaging from previsou exam, outside providers and external EMR if available.   As well as notes that were available from care everywhere and other healthcare systems.  Past medical history, social, surgical and family history all reviewed in electronic medical record.  No pertanent information unless stated regarding to the chief complaint.   Past Medical History:  Diagnosis Date  . Allergy   . Anxiety   . Cataract   . Fibromyalgia   . Glaucoma   . Legally blind     Allergies  Allergen Reactions  . Citrus Anaphylaxis  . Other Hives  . Latex Rash    welts     Review of Systems:  No headache, visual changes, nausea, vomiting, diarrhea, constipation, dizziness, abdominal pain, skin rash, fevers, chills, night sweats, weight loss, swollen lymph nodes, body aches, joint swelling, chest pain, shortness of breath, mood changes. POSITIVE muscle aches  Objective  Blood pressure 122/90, pulse 90, height 6\' 2"  (1.88 m), weight  258 lb (117 kg), SpO2 96 %.   General: No apparent distress alert and oriented x3 mood and affect normal, dressed appropriately.  HEENT: Pupils equal, extraocular movements intact  Respiratory: Patient's speak in full sentences and does not appear short of breath  Cardiovascular: No lower extremity edema, non tender, no erythema  Gait normal with good balance and coordination.  MSK: Left ankle exam shows the patient does have some mild tenderness over the ATFL.  Patient does have some tightness noted of the medial aspect of the ankle.  Mild laxity on the lateral aspect of the ankle.  No pain over the medial or lateral malleolus. Neck exam does have some loss of lordosis.  Tenderness to palpation of the paraspinal musculature right greater than left.  Mild positive Spurling's on the right side of the neck in the C8 and T1 distribution. Back back exam does have some loss of lordosis.  Some tenderness to palpation of the paraspinal musculature right greater than left.  Osteopathic findings  C2 flexed rotated and side bent right C6 flexed rotated and side bent left T3 extended rotated and side bent right inhaled rib T9 extended rotated and side bent left L2 flexed rotated and side bent right Sacrum right on right       Assessment and Plan:  Left ankle pain And seems to be more of a chronic lateral column overload.  Discussed with patient about icing regimen and home exercises.  Aircast given today to give him some relief.  Discussed proper shoes.  Given home exercises and work with Event organiser.  Follow-up again in 6 to 8 weeks  Degenerative disc disease, cervical Chronic problem with mild exacerbation.  Encourage the gabapentin on a more regular basis.  Do patient think that this is potentially contributing to some of the elbow pain he is having.  Mild positive Spurling's noted today.  Patient does not have any of the weakness.  Patient has negative Tinel at the ulnar area.  Discussed  with patient about icing regimen and home exercises.  Increase activity but decrease weight lifting specifically.  Follow-up again in 6 weeks  Back pain Chronic problem with mild tightness.  Responding well to manipulation.  Patient was doing better with core strengthening.  Discussed more isometrics that could be more beneficial.    Nonallopathic problems  Decision today to treat with OMT was based on Physical Exam  After verbal consent patient was treated with HVLA, ME, FPR techniques in cervical, rib, thoracic, lumbar, and sacral  areas  Patient tolerated the procedure well with improvement in symptoms  Patient given exercises, stretches and lifestyle modifications  See medications in patient instructions if given  Patient will follow up in 4-8 weeks      The above documentation has been reviewed and is accurate and complete [name], DO       Note: This dictation was prepared with Dragon dictation along with smaller phrase technology. Any transcriptional errors that result from this process are unintentional.

## 2021-01-14 ENCOUNTER — Ambulatory Visit (INDEPENDENT_AMBULATORY_CARE_PROVIDER_SITE_OTHER): Payer: Medicare Other | Admitting: Family Medicine

## 2021-01-14 ENCOUNTER — Encounter: Payer: Self-pay | Admitting: Family Medicine

## 2021-01-14 ENCOUNTER — Other Ambulatory Visit: Payer: Self-pay

## 2021-01-14 VITALS — BP 122/90 | HR 90 | Ht 74.0 in | Wt 258.0 lb

## 2021-01-14 DIAGNOSIS — M9904 Segmental and somatic dysfunction of sacral region: Secondary | ICD-10-CM | POA: Diagnosis not present

## 2021-01-14 DIAGNOSIS — M545 Low back pain, unspecified: Secondary | ICD-10-CM

## 2021-01-14 DIAGNOSIS — G8929 Other chronic pain: Secondary | ICD-10-CM

## 2021-01-14 DIAGNOSIS — M5030 Other cervical disc degeneration, unspecified cervical region: Secondary | ICD-10-CM | POA: Diagnosis not present

## 2021-01-14 DIAGNOSIS — M25572 Pain in left ankle and joints of left foot: Secondary | ICD-10-CM | POA: Diagnosis not present

## 2021-01-14 DIAGNOSIS — M9901 Segmental and somatic dysfunction of cervical region: Secondary | ICD-10-CM

## 2021-01-14 DIAGNOSIS — M9902 Segmental and somatic dysfunction of thoracic region: Secondary | ICD-10-CM | POA: Diagnosis not present

## 2021-01-14 DIAGNOSIS — M9908 Segmental and somatic dysfunction of rib cage: Secondary | ICD-10-CM

## 2021-01-14 DIAGNOSIS — M9903 Segmental and somatic dysfunction of lumbar region: Secondary | ICD-10-CM

## 2021-01-14 NOTE — Assessment & Plan Note (Signed)
And seems to be more of a chronic lateral column overload.  Discussed with patient about icing regimen and home exercises.  Aircast given today to give him some relief.  Discussed proper shoes.  Given home exercises and work with Event organiser.  Follow-up again in 6 to 8 weeks

## 2021-01-14 NOTE — Assessment & Plan Note (Signed)
Chronic problem with mild tightness.  Responding well to manipulation.  Patient was doing better with core strengthening.  Discussed more isometrics that could be more beneficial.

## 2021-01-14 NOTE — Assessment & Plan Note (Signed)
Chronic problem with mild exacerbation.  Encourage the gabapentin on a more regular basis.  Do patient think that this is potentially contributing to some of the elbow pain he is having.  Mild positive Spurling's noted today.  Patient does not have any of the weakness.  Patient has negative Tinel at the ulnar area.  Discussed with patient about icing regimen and home exercises.  Increase activity but decrease weight lifting specifically.  Follow-up again in 6 weeks

## 2021-01-14 NOTE — Patient Instructions (Addendum)
Good to see you Ankle exercises Scapular exercises Back in balance massage therapy Elbow is coming from the neck  See me again in 6 weeks

## 2021-02-17 DIAGNOSIS — H401133 Primary open-angle glaucoma, bilateral, severe stage: Secondary | ICD-10-CM | POA: Diagnosis not present

## 2021-02-25 NOTE — Progress Notes (Signed)
[name] [address] [place] [address] Phone: [phone] Subjective:   Bryce [name], am serving as a scribe for Dr. Antoine Primas. This visit occurred during the SARS-CoV-2 public health emergency.  Safety protocols were in place, including screening questions prior to the visit, additional usage of staff PPE, and extensive cleaning of exam room while observing appropriate contact time as indicated for disinfecting solutions.   I'm seeing this patient by the request  of:  [name], [name], MD  CC: Back pain and neck pain  [id number]  Bryce KINYON Sr. is a 45 y.o. male coming in with complaint of back and neck pain. OMT 01/14/2021. Patient states that it is time for a back adjustment.   Also complaining of R hand pain between 2nd and 3rd metacarpal. Patient notes cutting his finger severely when he was younger and now had decreased ROM. Pain with use that feels numb. Notes doing PT last year for pinched nerve in his neck which was irritating his R hand.  Medications patient has been prescribed: None  Taking:         Reviewed prior external information including notes and imaging from previsou exam, outside providers and external EMR if available.   As well as notes that were available from care everywhere and other healthcare systems.  Past medical history, social, surgical and family history all reviewed in electronic medical record.  No pertanent information unless stated regarding to the chief complaint.   Past Medical History:  Diagnosis Date   Allergy    Anxiety    Cataract    Fibromyalgia    Glaucoma    Legally blind     Allergies  Allergen Reactions   Citrus Anaphylaxis   Other Hives   Latex Rash    welts     Review of Systems:  No headache, visual changes, nausea, vomiting, diarrhea, constipation, dizziness, abdominal pain, skin rash, fevers, chills, night sweats, weight loss, swollen lymph nodes,  body aches, joint swelling, chest pain, shortness of breath, mood changes. POSITIVE muscle aches  Objective  Blood pressure 122/88, pulse 81, height 6\' 2"  (1.88 m), weight 256 lb (116.1 kg), SpO2 93 %.   General: No apparent distress alert and oriented x3 mood and affect normal, dressed appropriately.  HEENT: Pupils equal, extraocular movements intact  Respiratory: Patient's speak in full sentences and does not appear short of breath  Cardiovascular: No lower extremity edema, non tender, no erythema  Neck exam does have significant tightness noted.  Some tenderness noted in the paraspinal musculature of the cervical spine.  No midline tenderness.  Patient does have limited sidebending bilaterally.  Tightness noted in the parascapular region but full range of motion of the shoulders noted.  Patient does have significant tightness of the hip flexors bilaterally.  Difficulty with FABER test bilaterally.  Negative straight leg test.  Hand exam shows the patient does have tenderness to palpation between the index and middle finger of the right hand.  No significant swelling noted.  Patient does have tenderness over the A2 pulley on the palmar aspect with likely nodules noted. Osteopathic findings   C4 flexed rotated and side bent right T9 extended rotated and side bent left L2 flexed rotated and side bent right L4 flexed rotated and side bent left Sacrum right on right       Assessment and Plan:  Finger sprain Questionable the finger sprain.  Has had unfortunately ganglion cyst on the contralateral  hand previously.  If this continues we will consider an ultrasound in 6 weeks.  Back pain Chronic problem with mild exacerbation.  Patient has been more active doing more work around the house.  Discussed icing regimen and home exercise, discussed which activities to do which wants to avoid.  Increase activity slowly.  Follow-up with me again in 6 to 8 weeks   Nonallopathic problems  Decision  today to treat with OMT was based on Physical Exam  After verbal consent patient was treated with HVLA, ME, FPR techniques in, rib, thoracic, lumbar, and sacral  areas  Patient tolerated the procedure well with improvement in symptoms  Patient given exercises, stretches and lifestyle modifications  See medications in patient instructions if given  Patient will follow up in 4-8 weeks      The above documentation has been reviewed and is accurate and complete [name], DO       Note: This dictation was prepared with Dragon dictation along with smaller phrase technology. Any transcriptional errors that result from this process are unintentional.

## 2021-02-26 ENCOUNTER — Ambulatory Visit (INDEPENDENT_AMBULATORY_CARE_PROVIDER_SITE_OTHER): Payer: Medicare Other | Admitting: Family Medicine

## 2021-02-26 ENCOUNTER — Other Ambulatory Visit: Payer: Self-pay

## 2021-02-26 ENCOUNTER — Encounter: Payer: Self-pay | Admitting: Family Medicine

## 2021-02-26 VITALS — BP 122/88 | HR 81 | Ht 74.0 in | Wt 256.0 lb

## 2021-02-26 DIAGNOSIS — G8929 Other chronic pain: Secondary | ICD-10-CM

## 2021-02-26 DIAGNOSIS — M9903 Segmental and somatic dysfunction of lumbar region: Secondary | ICD-10-CM | POA: Diagnosis not present

## 2021-02-26 DIAGNOSIS — S63651D Sprain of metacarpophalangeal joint of left index finger, subsequent encounter: Secondary | ICD-10-CM | POA: Diagnosis not present

## 2021-02-26 DIAGNOSIS — M9904 Segmental and somatic dysfunction of sacral region: Secondary | ICD-10-CM | POA: Diagnosis not present

## 2021-02-26 DIAGNOSIS — M545 Low back pain, unspecified: Secondary | ICD-10-CM | POA: Diagnosis not present

## 2021-02-26 DIAGNOSIS — M9902 Segmental and somatic dysfunction of thoracic region: Secondary | ICD-10-CM

## 2021-02-26 NOTE — Patient Instructions (Signed)
Heat a massage to fingers Good luck with money pit See me in 6 weeks

## 2021-02-26 NOTE — Assessment & Plan Note (Signed)
Chronic problem with mild exacerbation.  Patient has been more active doing more work around the house.  Discussed icing regimen and home exercise, discussed which activities to do which wants to avoid.  Increase activity slowly.  Follow-up with me again in 6 to 8 weeks

## 2021-02-26 NOTE — Assessment & Plan Note (Signed)
Questionable the finger sprain.  Has had unfortunately ganglion cyst on the contralateral hand previously.  If this continues we will consider an ultrasound in 6 weeks.

## 2021-04-09 NOTE — Progress Notes (Signed)
[name] [address] [place] [address] Phone: [phone] Subjective:   Bryce [name], am serving as a scribe for Dr. Antoine Primas.  I'm seeing this patient by the request  of:  [name], [name], MD  CC: back and neck pain   [id number]  Bryce ELMAN Sr. is a 45 y.o. male coming in with complaint of back and neck pain. OMT 02/26/2021. Patient states that his back is really tight and also states that his right hand pointer finger/thumb area with a cyst is bothering him more than it was. States that he may need an injection   Medications patient has been prescribed: None  Taking:         Reviewed prior external information including notes and imaging from previsou exam, outside providers and external EMR if available.   As well as notes that were available from care everywhere and other healthcare systems.  Past medical history, social, surgical and family history all reviewed in electronic medical record.  No pertanent information unless stated regarding to the chief complaint.   Past Medical History:  Diagnosis Date   Allergy    Anxiety    Cataract    Fibromyalgia    Glaucoma    Legally blind     Allergies  Allergen Reactions   Citrus Anaphylaxis   Other Hives   Latex Rash    welts     Review of Systems:  No headache, visual changes, nausea, vomiting, diarrhea, constipation, dizziness, abdominal pain, skin rash, fevers, chills, night sweats, weight loss, swollen lymph nodes, body aches, joint swelling, chest pain, shortness of breath, mood changes. POSITIVE muscle aches  Objective  Blood pressure 122/86, pulse 82, height 6\' 2"  (1.88 m), weight 260 lb (117.9 kg), SpO2 98 %.   General: No apparent distress alert and oriented x3 mood and affect normal, dressed appropriately.  HEENT: Pupils equal, extraocular movements intact  Respiratory: Patient's speak in full sentences and does not appear short of  breath  Cardiovascular: No lower extremity edema, non tender, no erythema  Trigger nodule noted of 2nd flexor sheath oat A2 pulley noted.  Severely tender to palpation.  Mild triggering noted today. Neck exam does have some mild loss of lordosis.  Patient does have tightness with sidebending bilaterally.  Osteopathic findings  C2 flexed rotated and side bent right C4 flexed rotated and side bent left C6 flexed rotated and side bent left L2 flexed rotated and side bent right Sacrum right on right   After verbal consent patient was prepped with alcohol swab and with a 25-gauge half inch needle injected with 0.5 cc of 0.5% Marcaine and 0.5 cc of Kenalog 40 mg/mL into the second flexor tendon sheath at the A2 pulley on the right side.  No blood loss.  Band-Aid placed.  Postinjection instructions given    Assessment and Plan:  Trigger finger, right index finger Patient given injection and tolerated the procedure well.  Discussed icing regimen and home exercises.  Discussed avoiding certain activities.  Follow-up with me again in 6 to 8 weeks.  Degenerative disc disease, cervical Patient does have a significant amount of arthritic changes but has responded well to manipulation.  Encourage patient to continue on the Cymbalta to 40 mg daily.  We do have room to increase if necessary.  Still responds well to manipulation.  Follow-up again in 6 weeks   Nonallopathic problems  Decision today to treat with OMT was based on Physical  Exam  After verbal consent patient was treated with HVLA, ME, FPR techniques in cervical, thoracic, lumbar, and sacral  areas  Patient tolerated the procedure well with improvement in symptoms  Patient given exercises, stretches and lifestyle modifications  See medications in patient instructions if given  Patient will follow up in 4-8 weeks     The above documentation has been reviewed and is accurate and complete [name], DO        Note: This  dictation was prepared with Dragon dictation along with smaller phrase technology. Any transcriptional errors that result from this process are unintentional.

## 2021-04-10 ENCOUNTER — Other Ambulatory Visit: Payer: Self-pay

## 2021-04-10 ENCOUNTER — Encounter: Payer: Self-pay | Admitting: Family Medicine

## 2021-04-10 ENCOUNTER — Ambulatory Visit (INDEPENDENT_AMBULATORY_CARE_PROVIDER_SITE_OTHER): Payer: Medicare Other | Admitting: Family Medicine

## 2021-04-10 VITALS — BP 122/86 | HR 82 | Ht 74.0 in | Wt 260.0 lb

## 2021-04-10 DIAGNOSIS — M9904 Segmental and somatic dysfunction of sacral region: Secondary | ICD-10-CM

## 2021-04-10 DIAGNOSIS — M9901 Segmental and somatic dysfunction of cervical region: Secondary | ICD-10-CM

## 2021-04-10 DIAGNOSIS — M5030 Other cervical disc degeneration, unspecified cervical region: Secondary | ICD-10-CM

## 2021-04-10 DIAGNOSIS — M9902 Segmental and somatic dysfunction of thoracic region: Secondary | ICD-10-CM | POA: Diagnosis not present

## 2021-04-10 DIAGNOSIS — M65321 Trigger finger, right index finger: Secondary | ICD-10-CM

## 2021-04-10 DIAGNOSIS — M9903 Segmental and somatic dysfunction of lumbar region: Secondary | ICD-10-CM | POA: Diagnosis not present

## 2021-04-10 NOTE — Assessment & Plan Note (Signed)
Patient given injection and tolerated the procedure well.  Discussed icing regimen and home exercises.  Discussed avoiding certain activities.  Follow-up with me again in 6 to 8 weeks.

## 2021-04-10 NOTE — Assessment & Plan Note (Signed)
Patient does have a significant amount of arthritic changes but has responded well to manipulation.  Encourage patient to continue on the Cymbalta to 40 mg daily.  We do have room to increase if necessary.  Still responds well to manipulation.  Follow-up again in 6 weeks

## 2021-04-10 NOTE — Patient Instructions (Addendum)
Good to see you  Injection given today  Continue everything else See me again in 6-10 weeks

## 2021-05-21 NOTE — Progress Notes (Signed)
[name] [address] [place] [address] Phone: [phone] Subjective:   Bryce [name], am serving as a scribe for Dr. Antoine [name].  This visit occurred during the SARS-CoV-2 public health emergency.  Safety protocols were in place, including screening questions prior to the visit, additional usage of staff PPE, and extensive cleaning of exam room while observing appropriate contact time as indicated for disinfecting solutions.   I'm seeing this patient by the request  of:  Bryce [name], Bryce Patricia, MD  CC: Back and neck pain, hand pain  [id number]  Bryce WUEBKER Sr. is a 45 y.o. male coming in with complaint of back and neck pain OMT 04/10/2021. Patient states that his back and neck have been bothering him lately.   R hand pain in between 2nd and 3rd metacarpal. Now having pain over medial aspect of 2nd finger at MCP joint with use.  Patient was seen long time ago and did have an injection for potential cyst greater than 2 years ago.  States that it feels somewhat similar.  Medications patient has been prescribed: None  Taking:         Reviewed prior external information including notes and imaging from previsou exam, outside providers and external EMR if available.   As well as notes that were available from care everywhere and other healthcare systems.  Past medical history, social, surgical and family history all reviewed in electronic medical record.  No pertanent information unless stated regarding to the chief complaint.   Past Medical History:  Diagnosis Date   Allergy    Anxiety    Cataract    Fibromyalgia    Glaucoma    Legally blind     Allergies  Allergen Reactions   Citrus Anaphylaxis   Other Hives   Latex Rash    welts     Review of Systems:  No headache, visual changes, nausea, vomiting, diarrhea, constipation, dizziness, abdominal pain, skin rash, fevers, chills, night sweats, weight loss, swollen  lymph nodes, body aches, joint swelling, chest pain, shortness of breath, mood changes. POSITIVE muscle aches  Objective  Blood pressure 118/90, pulse 87, height 6\' 2"  (1.88 m), weight 252 lb (114.3 kg), SpO2 97 %.   General: No apparent distress alert and oriented x3 mood and affect normal, dressed appropriately.  HEENT: Pupils equal, extraocular movements intact  Respiratory: Patient's speak in full sentences and does not appear short of breath  Cardiovascular: No lower extremity edema, non tender, no erythema  Patient's neck exam does have some loss of lordosis.  Tightness noted in the paraspinal musculature right greater than left.  Patient does have tightness also in the right parascapular region.  Patient's low back has tightness in with loss of lordosis.  Tightness with FABER test.  Negative straight leg testing.  Right hand exam shows the patient does still have some tenderness noted between the second and third fingers more on the dorsal aspect.  No masses though appreciated on exam today.  Patient does have full range of motion noted.  Osteopathic findings  C2 flexed rotated and side bent right C6 flexed rotated and side bent left T3 extended rotated and side bent right inhaled rib T9 extended rotated and side bent left inhaled rib L2 flexed rotated and side bent right Sacrum right on right   Limited muscular skeletal ultrasound was performed and interpreted by , M  Limited musculoskeletal ultrasound of patient's hand shows that there is a very  small cyst approximately 0.3 cm still noted in the soft tissue closer to the second MCP than the third.  Patient does have some discomfort in that area.  With compression.    Assessment and Plan:  Degenerative disc disease, cervical Have arthritic changes but has been responding very well to osteopathic manipulation at the moment.  We will discuss to continue to do some of the exercises.  Patient has been increasing  activities as well.  Discussed icing regimen and home exercises.  Follow-up again 6 to 8 weeks   Nonallopathic problems  Decision today to treat with OMT was based on Physical Exam  After verbal consent patient was treated with HVLA, ME, FPR techniques in cervical, rib, thoracic, lumbar, and sacral  areas  Patient tolerated the procedure well with improvement in symptoms  Patient given exercises, stretches and lifestyle modifications  See medications in patient instructions if given  Patient will follow up in 4-8 weeks      The above documentation has been reviewed and is accurate and complete Bryce Saa, DO       Note: This dictation was prepared with Dragon dictation along with smaller phrase technology. Any transcriptional errors that result from this process are unintentional.

## 2021-05-22 ENCOUNTER — Ambulatory Visit: Payer: Self-pay

## 2021-05-22 ENCOUNTER — Encounter: Payer: Self-pay | Admitting: Family Medicine

## 2021-05-22 ENCOUNTER — Ambulatory Visit (INDEPENDENT_AMBULATORY_CARE_PROVIDER_SITE_OTHER): Payer: Medicare Other | Admitting: Family Medicine

## 2021-05-22 ENCOUNTER — Other Ambulatory Visit: Payer: Self-pay

## 2021-05-22 VITALS — BP 118/90 | HR 87 | Ht 74.0 in | Wt 252.0 lb

## 2021-05-22 DIAGNOSIS — M9901 Segmental and somatic dysfunction of cervical region: Secondary | ICD-10-CM

## 2021-05-22 DIAGNOSIS — M5030 Other cervical disc degeneration, unspecified cervical region: Secondary | ICD-10-CM

## 2021-05-22 DIAGNOSIS — M67442 Ganglion, left hand: Secondary | ICD-10-CM

## 2021-05-22 DIAGNOSIS — M9903 Segmental and somatic dysfunction of lumbar region: Secondary | ICD-10-CM

## 2021-05-22 DIAGNOSIS — M9908 Segmental and somatic dysfunction of rib cage: Secondary | ICD-10-CM

## 2021-05-22 DIAGNOSIS — M9904 Segmental and somatic dysfunction of sacral region: Secondary | ICD-10-CM

## 2021-05-22 DIAGNOSIS — M79641 Pain in right hand: Secondary | ICD-10-CM

## 2021-05-22 DIAGNOSIS — M9902 Segmental and somatic dysfunction of thoracic region: Secondary | ICD-10-CM

## 2021-05-22 NOTE — Assessment & Plan Note (Signed)
Has had injections previously on the contralateral side.  We will continue to monitor.  May need to consider injection again at follow-up.

## 2021-05-22 NOTE — Patient Instructions (Signed)
[email or web address] Stay active Keep journal about pain Watch finger See me in 6 weeks

## 2021-05-22 NOTE — Assessment & Plan Note (Signed)
Have arthritic changes but has been responding very well to osteopathic manipulation at the moment.  We will discuss to continue to do some of the exercises.  Patient has been increasing activities as well.  Discussed icing regimen and home exercises.  Follow-up again 6 to 8 weeks

## 2021-05-26 DIAGNOSIS — H401133 Primary open-angle glaucoma, bilateral, severe stage: Secondary | ICD-10-CM | POA: Diagnosis not present

## 2021-06-24 DIAGNOSIS — Z1211 Encounter for screening for malignant neoplasm of colon: Secondary | ICD-10-CM | POA: Diagnosis not present

## 2021-06-24 DIAGNOSIS — R051 Acute cough: Secondary | ICD-10-CM | POA: Diagnosis not present

## 2021-06-24 DIAGNOSIS — Z0000 Encounter for general adult medical examination without abnormal findings: Secondary | ICD-10-CM | POA: Diagnosis not present

## 2021-06-24 DIAGNOSIS — Z23 Encounter for immunization: Secondary | ICD-10-CM | POA: Diagnosis not present

## 2021-07-03 ENCOUNTER — Ambulatory Visit: Payer: Medicare Other | Admitting: Family Medicine

## 2021-08-08 NOTE — Progress Notes (Signed)
[name] [address] [place] [address] Phone: [phone] Subjective:   I, Jerene Canny, am serving as a Neurosurgeon for Doctor Terrilee Files  I'm seeing this patient by the request  of:  [name], [name], MD  CC: Neck and back pain follow-up  [id number]  Bryce HARDACRE Sr. is a 45 y.o. male coming in with complaint of back and neck pain. OMT 05/22/2021. Patient states back and neck pain would like doc to check Right hand pain interstitial space (3/4) with gripping patient states that overall seems to be doing well except for some tightness in the neck that is a little worse than usual.  Thinks it is just because we have not been quite seeing him as frequently.  Medications patient has been prescribed: None          Reviewed prior external information including notes and imaging from previsou exam, outside providers and external EMR if available.   As well as notes that were available from care everywhere and other healthcare systems.  Past medical history, social, surgical and family history all reviewed in electronic medical record.  No pertanent information unless stated regarding to the chief complaint.   Past Medical History:  Diagnosis Date   Allergy    Anxiety    Cataract    Fibromyalgia    Glaucoma    Legally blind     Allergies  Allergen Reactions   Citrus Anaphylaxis   Other Hives   Latex Rash    welts     Review of Systems:  No headache, visual changes, nausea, vomiting, diarrhea, constipation, dizziness, abdominal pain, skin rash, fevers, chills, night sweats, weight loss, swollen lymph nodes, body aches, joint swelling, chest pain, shortness of breath, mood changes. POSITIVE muscle aches  Objective  Blood pressure 118/88, pulse 81, height 6\' 2"  (1.88 m), weight 262 lb (118.8 kg), SpO2 96 %.   General: No apparent distress alert and oriented x3 mood and affect normal, dressed appropriately.  HEENT:  Pupils equal, extraocular movements intact  Respiratory: Patient's speak in full sentences and does not appear short of breath  Cardiovascular: No lower extremity edema, non tender, no erythema  Neck exam does have some loss lordosis.  Tightness noted in the parascapular region.  Seems to be more on the right side.  Tightness in the trapezius noted.  Osteopathic findings  C2 flexed rotated and side bent right T2 extended rotated and side bent right inhaled rib T9 extended rotated and side bent left L2 flexed rotated and side bent right L5 flexed rotated and side bent left Sacrum right on right       Assessment and Plan:  Degenerative disc disease, cervical Chronic problem with exacerbation.  Discussed the Cymbalta, gabapentin.  Continue.  I do think more of his posture and secondary to having to reschedule his recent visit.  We will continue to monitor his other symptoms.  Follow-up with me again in 4 to 8 weeks   Nonallopathic problems  Decision today to treat with OMT was based on Physical Exam  After verbal consent patient was treated with HVLA, ME, FPR techniques in cervical, rib, thoracic, lumbar, and sacral  areas  Patient tolerated the procedure well with improvement in symptoms  Patient given exercises, stretches and lifestyle modifications  See medications in patient instructions if given  Patient will follow up in 4-8 weeks      The above documentation has been reviewed and is accurate  and complete Lyndal Pulley, DO       Note: This dictation was prepared with Dragon dictation along with smaller phrase technology. Any transcriptional errors that result from this process are unintentional.
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
## 2021-08-11 ENCOUNTER — Other Ambulatory Visit: Payer: Self-pay

## 2021-08-11 ENCOUNTER — Ambulatory Visit (INDEPENDENT_AMBULATORY_CARE_PROVIDER_SITE_OTHER): Payer: Medicare Other | Admitting: Family Medicine

## 2021-08-11 VITALS — BP 118/88 | HR 81 | Ht 74.0 in | Wt 262.0 lb

## 2021-08-11 DIAGNOSIS — M9901 Segmental and somatic dysfunction of cervical region: Secondary | ICD-10-CM | POA: Diagnosis not present

## 2021-08-11 DIAGNOSIS — M9902 Segmental and somatic dysfunction of thoracic region: Secondary | ICD-10-CM | POA: Diagnosis not present

## 2021-08-11 DIAGNOSIS — M9904 Segmental and somatic dysfunction of sacral region: Secondary | ICD-10-CM

## 2021-08-11 DIAGNOSIS — M9908 Segmental and somatic dysfunction of rib cage: Secondary | ICD-10-CM | POA: Diagnosis not present

## 2021-08-11 DIAGNOSIS — M5030 Other cervical disc degeneration, unspecified cervical region: Secondary | ICD-10-CM | POA: Diagnosis not present

## 2021-08-11 DIAGNOSIS — M9903 Segmental and somatic dysfunction of lumbar region: Secondary | ICD-10-CM | POA: Diagnosis not present

## 2021-08-11 NOTE — Patient Instructions (Signed)
Great to see you  Thanks for talking Bball  Try to do the exercises more frequently  See me again in 5 weeks

## 2021-08-11 NOTE — Assessment & Plan Note (Signed)
Chronic problem with exacerbation.  Discussed the Cymbalta, gabapentin.  Continue.  I do think more of his posture and secondary to [place] having to reschedule his recent visit.  We will continue to monitor his other symptoms.  Follow-up with me again in 4 to 8 weeks

## 2021-09-12 NOTE — Progress Notes (Signed)
[name] [address] [place] [address] Phone: [phone] Subjective:   [name], am serving as a scribe for Dr. Antoine Primas.This visit occurred during the SARS-CoV-2 public health emergency.  Safety protocols were in place, including screening questions prior to the visit, additional usage of staff PPE, and extensive cleaning of exam room while observing appropriate contact time as indicated for disinfecting solutions.   I'm seeing this patient by the request  of:  [name], [name], MD  CC: Low back and neck pain follow-up  [id number]  Bryce PLAIN Sr. is a 46 y.o. male coming in with complaint of back and neck pain. OMT 08/11/2021. Patient states that has had an increase in R sided cervical as well as lower back pain. Feels weather is contributing. Is back exercising again.    Medications patient has been prescribed: None  Taking:         Reviewed prior external information including notes and imaging from previsou exam, outside providers and external EMR if available.   As well as notes that were available from care everywhere and other healthcare systems.  Past medical history, social, surgical and family history all reviewed in electronic medical record.  No pertanent information unless stated regarding to the chief complaint.   Past Medical History:  Diagnosis Date   Allergy    Anxiety    Cataract    Fibromyalgia    Glaucoma    Legally blind     Allergies  Allergen Reactions   Citrus Anaphylaxis   Other Hives   Latex Rash    welts     Review of Systems:  No headache, visual changes, nausea, vomiting, diarrhea, constipation, dizziness, abdominal pain, skin rash, fevers, chills, night sweats, weight loss, swollen lymph nodes, body aches, joint swelling, chest pain, shortness of breath, mood changes. POSITIVE muscle aches  Objective  Blood pressure 120/76, pulse 92, height 6\' 2"  (1.88 m), weight  260 lb (117.9 kg), SpO2 97 %.   General: No apparent distress alert and oriented x3 mood and affect normal, dressed appropriately.  HEENT: Pupils equal, extraocular movements intact  Respiratory: Patient's speak in full sentences and does not appear short of breath  Cardiovascular: No lower extremity edema, non tender, no erythema  Low back exam does have some loss of lordosis.  Some tenderness to palpation of the paraspinal musculature. Tightness with straight leg test. Increasing tightness noted of the right trapezius area.  Negative Spurling's.  Osteopathic findings  C2 flexed rotated and side bent right C6 flexed rotated and side bent left T3 extended rotated and side bent right inhaled rib T9 extended rotated and side bent left L2 flexed rotated and side bent right Sacrum right on right       Assessment and Plan:  Back pain Patient back exam does have some loss of lordosis.  Patient does not have anything new but exacerbation of chronic problem. To osteopathic manipulation.  Increase activity slowly.  Follow-up again in 6 to 8 weeks.  Degenerative disc disease, cervical Increased tightness noted as well.  Likely secondary to patient working out on a more regular basis.  Discussed avoiding certain activities.  Follow-up again in 6 to 8 weeks.    Nonallopathic problems  Decision today to treat with OMT was based on Physical Exam  After verbal consent patient was treated with HVLA, ME, FPR techniques in cervical, rib, thoracic, lumbar, and sacral  areas  Patient tolerated the procedure well  with improvement in symptoms  Patient given exercises, stretches and lifestyle modifications  See medications in patient instructions if given  Patient will follow up in 4-8 weeks     The above documentation has been reviewed and is accurate and complete [name], DO       Note: This dictation was prepared with Dragon dictation along with smaller phrase technology.  Any transcriptional errors that result from this process are unintentional.

## 2021-09-16 ENCOUNTER — Other Ambulatory Visit: Payer: Self-pay

## 2021-09-16 ENCOUNTER — Ambulatory Visit (INDEPENDENT_AMBULATORY_CARE_PROVIDER_SITE_OTHER): Payer: Medicare Other | Admitting: Family Medicine

## 2021-09-16 VITALS — BP 120/76 | HR 92 | Ht 74.0 in | Wt 260.0 lb

## 2021-09-16 DIAGNOSIS — M545 Low back pain, unspecified: Secondary | ICD-10-CM | POA: Diagnosis not present

## 2021-09-16 DIAGNOSIS — M5030 Other cervical disc degeneration, unspecified cervical region: Secondary | ICD-10-CM | POA: Diagnosis not present

## 2021-09-16 DIAGNOSIS — G8929 Other chronic pain: Secondary | ICD-10-CM | POA: Diagnosis not present

## 2021-09-16 DIAGNOSIS — M9903 Segmental and somatic dysfunction of lumbar region: Secondary | ICD-10-CM | POA: Diagnosis not present

## 2021-09-16 DIAGNOSIS — M9904 Segmental and somatic dysfunction of sacral region: Secondary | ICD-10-CM | POA: Diagnosis not present

## 2021-09-16 DIAGNOSIS — M9902 Segmental and somatic dysfunction of thoracic region: Secondary | ICD-10-CM | POA: Diagnosis not present

## 2021-09-16 DIAGNOSIS — M9908 Segmental and somatic dysfunction of rib cage: Secondary | ICD-10-CM

## 2021-09-16 DIAGNOSIS — M9901 Segmental and somatic dysfunction of cervical region: Secondary | ICD-10-CM | POA: Diagnosis not present

## 2021-09-16 NOTE — Assessment & Plan Note (Signed)
Increased tightness noted as well.  Likely secondary to patient working out on a more regular basis.  Discussed avoiding certain activities.  Follow-up again in 6 to 8 weeks.

## 2021-09-16 NOTE — Assessment & Plan Note (Signed)
Patient back exam does have some loss of lordosis.  Patient does not have anything new but exacerbation of chronic problem. To osteopathic manipulation.  Increase activity slowly.  Follow-up again in 6 to 8 weeks.

## 2021-09-16 NOTE — Patient Instructions (Signed)
Good to see you! Going to be a little sore today Keep it up See you again in 5 -6 weeks

## 2021-10-06 DIAGNOSIS — H401133 Primary open-angle glaucoma, bilateral, severe stage: Secondary | ICD-10-CM | POA: Diagnosis not present

## 2021-10-20 NOTE — Progress Notes (Signed)
[facility] [place] Phone: [phone] Subjective:   [name], am serving as a scribe for Dr. Hulan Saas.  This visit occurred during the SARS-CoV-2 public health emergency.  Safety protocols were in place, including screening questions prior to the visit, additional usage of staff PPE, and extensive cleaning of exam room while observing appropriate contact time as indicated for disinfecting solutions.    I'm seeing this patient by the request  of:  Isaac Bliss, Rayford Halsted, MD  CC: Neck pain follow-up  [id number]  Bryce [name] Sr. is a 46 y.o. male coming in with complaint of back and neck pain. OMT 09/16/2021. Patient states that his neck and upper back are tight. R>L.  Pain worse when temps dropped.   Medications patient has been prescribed: None         Reviewed prior external information including notes and imaging from previsou exam, outside providers and external EMR if available.   As well as notes that were available from care everywhere and other healthcare systems.  Past medical history, social, surgical and family history all reviewed in electronic medical record.  No pertanent information unless stated regarding to the chief complaint.   Past Medical History:  Diagnosis Date   Allergy    Anxiety    Cataract    Fibromyalgia    Glaucoma    Legally blind     Allergies  Allergen Reactions   Citrus Anaphylaxis   Other Hives   Latex Rash    welts     Review of Systems:  No headache, visual changes, nausea, vomiting, diarrhea, constipation, dizziness, abdominal pain, skin rash, fevers, chills, night sweats, weight loss, swollen lymph nodes, body aches, joint swelling, chest pain, shortness of breath, mood changes. POSITIVE muscle aches  Objective  Blood pressure 120/84, pulse 81, height 6\' 2"  (1.88 m), weight 257 lb (116.6 kg), SpO2 96 %.   General: No apparent distress alert and  oriented x3 mood and affect normal, dressed appropriately.  HEENT: Pupils equal, extraocular movements intact  Respiratory: Patient's speak in full sentences and does not appear short of breath  Cardiovascular: No lower extremity edema, non tender, no erythema  Neck exam does have some loss of lordosis.  Some tenderness to palpation in the paraspinal musculature.  Osteopathic findings  C2 flexed rotated and side bent right C6 flexed rotated and side bent left T3 extended rotated and side bent right inhaled rib T5 extended rotated and side bent left L1 flexed rotated and side bent right Sacrum right on right       Assessment and Plan:  Degenerative disc disease, cervical Chronic, with mild exacerbation.  Patient is not having any radicular symptoms.  Doing well with the different medications at the moment.  Does have the gabapentin that I think can be helpful as well.  Chronic, with mild exacerbation otherwise.  We will follow-up again in 5 to 6 weeks   Nonallopathic problems  Decision today to treat with OMT was based on Physical Exam  After verbal consent patient was treated with HVLA, ME, FPR techniques in cervical, rib, thoracic, lumbar, and sacral  areas  Patient tolerated the procedure well with improvement in symptoms  Patient given exercises, stretches and lifestyle modifications  See medications in patient instructions if given  Patient will follow up in 4-8 weeks      The above documentation has been reviewed and is accurate and complete Lyndal Pulley,  DO        Note: This dictation was prepared with Dragon dictation along with smaller phrase technology. Any transcriptional errors that result from this process are unintentional.

## 2021-10-21 ENCOUNTER — Ambulatory Visit (INDEPENDENT_AMBULATORY_CARE_PROVIDER_SITE_OTHER): Payer: Medicare HMO | Admitting: Family Medicine

## 2021-10-21 ENCOUNTER — Encounter: Payer: Self-pay | Admitting: Family Medicine

## 2021-10-21 ENCOUNTER — Other Ambulatory Visit: Payer: Self-pay

## 2021-10-21 VITALS — BP 120/84 | HR 81 | Ht 74.0 in | Wt 257.0 lb

## 2021-10-21 DIAGNOSIS — M9904 Segmental and somatic dysfunction of sacral region: Secondary | ICD-10-CM

## 2021-10-21 DIAGNOSIS — M9908 Segmental and somatic dysfunction of rib cage: Secondary | ICD-10-CM

## 2021-10-21 DIAGNOSIS — M5030 Other cervical disc degeneration, unspecified cervical region: Secondary | ICD-10-CM | POA: Diagnosis not present

## 2021-10-21 DIAGNOSIS — M9901 Segmental and somatic dysfunction of cervical region: Secondary | ICD-10-CM

## 2021-10-21 DIAGNOSIS — M9903 Segmental and somatic dysfunction of lumbar region: Secondary | ICD-10-CM

## 2021-10-21 DIAGNOSIS — M9902 Segmental and somatic dysfunction of thoracic region: Secondary | ICD-10-CM | POA: Diagnosis not present

## 2021-10-21 NOTE — Assessment & Plan Note (Signed)
Chronic, with mild exacerbation.  Patient is not having any radicular symptoms.  Doing well with the different medications at the moment.  Does have the gabapentin that I think can be helpful as well.  Chronic, with mild exacerbation otherwise.  We will follow-up again in 5 to 6 weeks

## 2021-10-21 NOTE — Patient Instructions (Signed)
Always great to see you Keep up weight loss See me in 5-6 weeks
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
## 2021-11-25 ENCOUNTER — Ambulatory Visit: Payer: Self-pay

## 2021-11-25 ENCOUNTER — Ambulatory Visit: Payer: Medicare HMO | Admitting: Family Medicine

## 2021-11-25 ENCOUNTER — Other Ambulatory Visit: Payer: Self-pay

## 2021-11-25 VITALS — BP 110/80 | HR 78 | Ht 74.0 in | Wt 265.0 lb

## 2021-11-25 DIAGNOSIS — M9901 Segmental and somatic dysfunction of cervical region: Secondary | ICD-10-CM | POA: Diagnosis not present

## 2021-11-25 DIAGNOSIS — M9908 Segmental and somatic dysfunction of rib cage: Secondary | ICD-10-CM

## 2021-11-25 DIAGNOSIS — M9904 Segmental and somatic dysfunction of sacral region: Secondary | ICD-10-CM | POA: Diagnosis not present

## 2021-11-25 DIAGNOSIS — M5030 Other cervical disc degeneration, unspecified cervical region: Secondary | ICD-10-CM | POA: Diagnosis not present

## 2021-11-25 DIAGNOSIS — M9903 Segmental and somatic dysfunction of lumbar region: Secondary | ICD-10-CM

## 2021-11-25 DIAGNOSIS — M9902 Segmental and somatic dysfunction of thoracic region: Secondary | ICD-10-CM | POA: Diagnosis not present

## 2021-11-25 DIAGNOSIS — M25562 Pain in left knee: Secondary | ICD-10-CM | POA: Diagnosis not present
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
## 2021-11-25 NOTE — Patient Instructions (Signed)
L tru pull lite ?See me in 4-6 weeks ?
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
## 2021-12-29 ENCOUNTER — Encounter: Payer: Self-pay | Admitting: Family Medicine

## 2021-12-29 ENCOUNTER — Ambulatory Visit: Payer: Medicare HMO | Admitting: Family Medicine

## 2021-12-29 ENCOUNTER — Ambulatory Visit (INDEPENDENT_AMBULATORY_CARE_PROVIDER_SITE_OTHER): Payer: Medicare HMO

## 2021-12-29 VITALS — BP 118/84 | HR 88 | Ht 74.0 in | Wt 265.0 lb

## 2021-12-29 DIAGNOSIS — M9901 Segmental and somatic dysfunction of cervical region: Secondary | ICD-10-CM | POA: Diagnosis not present

## 2021-12-29 DIAGNOSIS — M25562 Pain in left knee: Secondary | ICD-10-CM | POA: Diagnosis not present

## 2021-12-29 DIAGNOSIS — M7050 Other bursitis of knee, unspecified knee: Secondary | ICD-10-CM | POA: Diagnosis not present

## 2021-12-29 DIAGNOSIS — M9903 Segmental and somatic dysfunction of lumbar region: Secondary | ICD-10-CM

## 2021-12-29 DIAGNOSIS — M9902 Segmental and somatic dysfunction of thoracic region: Secondary | ICD-10-CM | POA: Diagnosis not present

## 2021-12-29 DIAGNOSIS — M9904 Segmental and somatic dysfunction of sacral region: Secondary | ICD-10-CM | POA: Diagnosis not present

## 2021-12-29 DIAGNOSIS — M9908 Segmental and somatic dysfunction of rib cage: Secondary | ICD-10-CM | POA: Diagnosis not present

## 2021-12-29 MED ORDER — DICLOFENAC SODIUM 1 % EX GEL: 2.0000 g | Freq: Two times a day (BID) | CUTANEOUS | 4 refills | Status: AC

## 2021-12-29 NOTE — Patient Instructions (Addendum)
Injection today ?Good to see you! ?See you again in 5-6 weeks ?Wear gloves with lifting ?Xray today ?Neck and back are the same ?

## 2021-12-29 NOTE — Assessment & Plan Note (Signed)
Patient is being injected today.  Discussed icing regimen and home exercises, which activities to do which ones to avoid.  Increase activity slowly.  Follow-up again in 6 to 8 weeks ?
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
## 2022-02-09 ENCOUNTER — Ambulatory Visit: Payer: Medicare HMO | Admitting: Family Medicine

## 2022-02-09 VITALS — BP 122/88 | HR 87 | Ht 74.0 in | Wt 271.0 lb

## 2022-02-09 DIAGNOSIS — M9904 Segmental and somatic dysfunction of sacral region: Secondary | ICD-10-CM | POA: Diagnosis not present

## 2022-02-09 DIAGNOSIS — M9908 Segmental and somatic dysfunction of rib cage: Secondary | ICD-10-CM

## 2022-02-09 DIAGNOSIS — M9903 Segmental and somatic dysfunction of lumbar region: Secondary | ICD-10-CM | POA: Diagnosis not present

## 2022-02-09 DIAGNOSIS — S83207A Unspecified tear of unspecified meniscus, current injury, left knee, initial encounter: Secondary | ICD-10-CM

## 2022-02-09 DIAGNOSIS — M5030 Other cervical disc degeneration, unspecified cervical region: Secondary | ICD-10-CM

## 2022-02-09 DIAGNOSIS — M9901 Segmental and somatic dysfunction of cervical region: Secondary | ICD-10-CM | POA: Diagnosis not present

## 2022-02-09 DIAGNOSIS — M9902 Segmental and somatic dysfunction of thoracic region: Secondary | ICD-10-CM

## 2022-02-09 NOTE — Patient Instructions (Signed)
See you again in 6 weeks Do prescribed exercises at least 3x a week
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
## 2022-02-09 NOTE — Assessment & Plan Note (Signed)
Tightness noted.  Discussed sidebending.  Discussed which activities to do and which ones to avoid.  Increase activity slowly.  Follow-up again in 6 to 8 weeks.

## 2022-03-20 NOTE — Progress Notes (Unsigned)
[name] [address] [place] [address] Phone: [phone] Subjective:   [name], am serving as a scribe for Dr. Antoine Primas.  I'm seeing this patient by the request  of:  [name], [name], MD  CC: Back and neck pain follow-up  [id number]  VINCIENT VANAMAN Sr. is a 46 y.o. male coming in with complaint of back and neck pain. OMT 02/09/2022. Patient states same per usual. Here for manipulation. No new complaints. Patient has been trying to be active but is finding it somewhat difficult to more secondary to time.  Recently though patient's wife and granddaughter is out of the house only have a little more time for himself.  Medications patient has been prescribed: None  Taking:         Reviewed prior external information including notes and imaging from previsou exam, outside providers and external EMR if available.   As well as notes that were available from care everywhere and other healthcare systems.  Past medical history, social, surgical and family history all reviewed in electronic medical record.  No pertanent information unless stated regarding to the chief complaint.   Past Medical History:  Diagnosis Date   Allergy    Anxiety    Cataract    Fibromyalgia    Glaucoma    Legally blind     Allergies  Allergen Reactions   Citrus Anaphylaxis   Other Hives   Latex Rash    welts     Review of Systems:  No headache, visual changes, nausea, vomiting, diarrhea, constipation, dizziness, abdominal pain, skin rash, fevers, chills, night sweats, weight loss, swollen lymph nodes,, joint swelling, chest pain, shortness of breath, mood changes. POSITIVE muscle aches, body aches  Objective  Blood pressure 124/88, pulse 72, height 6\' 2"  (1.88 m), weight 260 lb (117.9 kg), SpO2 98 %.   General: No apparent distress alert and oriented x3 mood and affect normal, dressed appropriately.  HEENT: Pupils equal,  extraocular movements intact  Respiratory: Patient's speak in full sentences and does not appear short of breath  Cardiovascular: No lower extremity edema, non tender, no erythema  Gait MSK:  Back neck exam does have some loss of lordosis.  Tenderness to palpation noted.  Low back exam does have some tightness noted as well.  Osteopathic findings  C2 flexed rotated and side bent right C6 flexed rotated and side bent left T3 extended rotated and side bent right inhaled rib T9 extended rotated and side bent left L2 flexed rotated and side bent right Sacrum right on right       Assessment and Plan:  Back pain Chronic problem with worsening symptoms.  Not taking the gabapentin with the Cymbalta extremely regularly.  Patient is now working out on a more regular basis which usually does seem to be more beneficial.  Discussed posture and ergonomics otherwise.  Increase activity slowly.  Follow-up again in 6 to 8 weeks.    Nonallopathic problems  Decision today to treat with OMT was based on Physical Exam  After verbal consent patient was treated with HVLA, ME, FPR techniques in cervical, rib, thoracic, lumbar, and sacral  areas  Patient tolerated the procedure well with improvement in symptoms  Patient given exercises, stretches and lifestyle modifications  See medications in patient instructions if given  Patient will follow up in 4-8 weeks     The above documentation has been reviewed and is accurate and complete , DO  Note: This dictation was prepared with Dragon dictation along with smaller phrase technology. Any transcriptional errors that result from this process are unintentional.

## 2022-03-23 ENCOUNTER — Ambulatory Visit: Payer: Medicare HMO | Admitting: Family Medicine

## 2022-03-23 VITALS — BP 124/88 | HR 72 | Ht 74.0 in | Wt 260.0 lb

## 2022-03-23 DIAGNOSIS — M9903 Segmental and somatic dysfunction of lumbar region: Secondary | ICD-10-CM | POA: Diagnosis not present

## 2022-03-23 DIAGNOSIS — M9901 Segmental and somatic dysfunction of cervical region: Secondary | ICD-10-CM

## 2022-03-23 DIAGNOSIS — M545 Low back pain, unspecified: Secondary | ICD-10-CM | POA: Diagnosis not present

## 2022-03-23 DIAGNOSIS — M9904 Segmental and somatic dysfunction of sacral region: Secondary | ICD-10-CM | POA: Diagnosis not present

## 2022-03-23 DIAGNOSIS — M9902 Segmental and somatic dysfunction of thoracic region: Secondary | ICD-10-CM | POA: Diagnosis not present

## 2022-03-23 DIAGNOSIS — M9908 Segmental and somatic dysfunction of rib cage: Secondary | ICD-10-CM

## 2022-03-23 DIAGNOSIS — G8929 Other chronic pain: Secondary | ICD-10-CM

## 2022-03-23 NOTE — Assessment & Plan Note (Signed)
Chronic problem with worsening symptoms.  Not taking the gabapentin with the Cymbalta extremely regularly.  Patient is now working out on a more regular basis which usually does seem to be more beneficial.  Discussed posture and ergonomics otherwise.  Increase activity slowly.  Follow-up again in 6 to 8 weeks.

## 2022-03-23 NOTE — Patient Instructions (Signed)
Good to see you! See you again in 6 weeks 
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
## 2022-05-04 ENCOUNTER — Ambulatory Visit: Payer: Medicare HMO | Admitting: Family Medicine

## 2022-05-04 VITALS — BP 120/84 | HR 76 | Ht 74.0 in | Wt 253.0 lb

## 2022-05-04 DIAGNOSIS — M9903 Segmental and somatic dysfunction of lumbar region: Secondary | ICD-10-CM | POA: Diagnosis not present

## 2022-05-04 DIAGNOSIS — M9902 Segmental and somatic dysfunction of thoracic region: Secondary | ICD-10-CM | POA: Diagnosis not present

## 2022-05-04 DIAGNOSIS — M9908 Segmental and somatic dysfunction of rib cage: Secondary | ICD-10-CM

## 2022-05-04 DIAGNOSIS — S83207A Unspecified tear of unspecified meniscus, current injury, left knee, initial encounter: Secondary | ICD-10-CM | POA: Diagnosis not present

## 2022-05-04 DIAGNOSIS — M9904 Segmental and somatic dysfunction of sacral region: Secondary | ICD-10-CM | POA: Diagnosis not present

## 2022-05-04 DIAGNOSIS — M9901 Segmental and somatic dysfunction of cervical region: Secondary | ICD-10-CM | POA: Diagnosis not present

## 2022-05-04 DIAGNOSIS — M5030 Other cervical disc degeneration, unspecified cervical region: Secondary | ICD-10-CM | POA: Diagnosis not present

## 2022-05-04 NOTE — Patient Instructions (Signed)
Good to see you! Tart Cherry 1200mg  No twisting If needed we will do injection

## 2022-05-04 NOTE — Assessment & Plan Note (Signed)
Concern patient may be having a reaccumulation of the cyst.  At this moment we will hold on any if it does seem to be worsening we will next follow-up.  The patient will avoid any type of twisting motion.

## 2022-05-04 NOTE — Assessment & Plan Note (Signed)
Chronic, with exacerbation.  Discussed icing regimen and home exercises.  Still responding well to osteopathic manipulation.  Has had Cymbalta and, approximately and gabapentin previously.  Follow-up again in 6 to 8 weeks

## 2022-06-12 NOTE — Progress Notes (Signed)
Bryce [name] [place] [address] [place] Phone: [phone] Subjective:   Bryce [name], am serving as a scribe for Dr. Hulan Saas.  I'm seeing this patient by the request  of:  Bryce [name], Bryce Halsted, MD  CC: Back and neck pain follow-up  [id number]  Bryce SCIANDRA Sr. is a 46 y.o. male coming in with complaint of back and neck pain. OMT 05/04/2022. Patient states doing okay. Here for manipulation. Would like you to check left knee. Possible injection. No other issues.  Medications patient has been prescribed: None  Taking:         Reviewed prior external information including notes and imaging from previsou exam, outside providers and external EMR if available.   As well as notes that were available from care everywhere and other healthcare systems.  Past medical history, social, surgical and family history all reviewed in electronic medical record.  No pertanent information unless stated regarding to the chief complaint.   Past Medical History:  Diagnosis Date   Allergy    Anxiety    Cataract    Fibromyalgia    Glaucoma    Legally blind     Allergies  Allergen Reactions   Citrus Anaphylaxis   Other Hives   Latex Rash    welts     Review of Systems:  No headache, visual changes, nausea, vomiting, diarrhea, constipation, dizziness, abdominal pain, skin rash, fevers, chills, night sweats, weight loss, swollen lymph nodes, body aches, joint swelling, chest pain, shortness of breath, mood changes. POSITIVE muscle aches  Objective  Blood pressure 118/86, pulse 67, height 6\' 2"  (1.88 m), weight 259 lb (117.5 kg), SpO2 97 %.   General: No apparent distress alert and oriented x3 mood and affect normal, dressed appropriately.  HEENT: Pupils equal, extraocular movements intact  Respiratory: Patient's speak in full sentences and does not appear short of breath  Cardiovascular: No lower extremity edema, non tender, no  erythema  Gait MSK:  Back back does have some loss of lordosis.  Some tenderness to palpation in the paraspinal musculature.  Patient does have tightness with FABER test right greater than left.  Patient though does have tenderness over the left knee as well.  No effusion noted.  Lacks last 10 degrees of flexion.  Osteopathic findings  C2 flexed rotated and side bent right C4 flexed rotated and side bent left T4 extended rotated and side bent right inhaled rib T7 extended rotated and side bent left L3 flexed rotated and side bent right Sacrum right on right       Assessment and Plan:  Degenerative arthritis of knee Chronic problem with worsening symptoms.  Starting to affect daily activities. We discussed the duloxetine.  We discussed which activities to do and which ones to avoid.  Patient will continue with the duloxetine at 40 mg, gabapentin at 200 mg at night as well as given the injection today that hopefully will be beneficial.  Follow-up again in 6 to 8 weeks  Back pain Chronic problem with likely exacerbation. Also responding relatively well to the gabapentin at 200 mg at night.  Has naproxen 500 mg twice daily for breakthrough pain.  Evaluated and did do osteopathic manipulation again today.  Follow-up again in 6 to 8-week.  This will be for further evaluation of his back pain that could have been exacerbated from the knee pain as well.    Nonallopathic problems  Decision today to treat with OMT was  based on Physical Exam  After verbal consent patient was treated with HVLA, ME, FPR techniques in cervical, rib, thoracic, lumbar, and sacral  areas  Patient tolerated the procedure well with improvement in symptoms  Patient given exercises, stretches and lifestyle modifications  See medications in patient instructions if given  Patient will follow up in 4-8 weeks    The above documentation has been reviewed and is accurate and complete Bryce Pulley, DO           Note: This dictation was prepared with Dragon dictation along with smaller phrase technology. Any transcriptional errors that result from this process are unintentional.

## 2022-06-15 ENCOUNTER — Ambulatory Visit: Payer: Medicare HMO | Admitting: Family Medicine

## 2022-06-15 VITALS — BP 118/86 | HR 67 | Ht 74.0 in | Wt 259.0 lb

## 2022-06-15 DIAGNOSIS — M545 Low back pain, unspecified: Secondary | ICD-10-CM

## 2022-06-15 DIAGNOSIS — M9901 Segmental and somatic dysfunction of cervical region: Secondary | ICD-10-CM | POA: Diagnosis not present

## 2022-06-15 DIAGNOSIS — M9908 Segmental and somatic dysfunction of rib cage: Secondary | ICD-10-CM | POA: Diagnosis not present

## 2022-06-15 DIAGNOSIS — M9903 Segmental and somatic dysfunction of lumbar region: Secondary | ICD-10-CM | POA: Diagnosis not present

## 2022-06-15 DIAGNOSIS — M9904 Segmental and somatic dysfunction of sacral region: Secondary | ICD-10-CM

## 2022-06-15 DIAGNOSIS — M9902 Segmental and somatic dysfunction of thoracic region: Secondary | ICD-10-CM | POA: Diagnosis not present

## 2022-06-15 DIAGNOSIS — M1712 Unilateral primary osteoarthritis, left knee: Secondary | ICD-10-CM

## 2022-06-15 DIAGNOSIS — G8929 Other chronic pain: Secondary | ICD-10-CM

## 2022-06-15 NOTE — Patient Instructions (Addendum)
Injection in left knee today Good to see you! Thanks for talking shop with me Knee should do great

## 2022-06-15 NOTE — Assessment & Plan Note (Signed)
Chronic problem with worsening symptoms.  Starting to affect daily activities. We discussed the duloxetine.  We discussed which activities to do and which ones to avoid.  Patient will continue with the duloxetine at 40 mg, gabapentin at 200 mg at night as well as given the injection today that hopefully will be beneficial.  Follow-up again in 6 to 8 weeks

## 2022-06-15 NOTE — Assessment & Plan Note (Signed)
Chronic problem with likely exacerbation. Also responding relatively well to the gabapentin at 200 mg at night.  Has naproxen 500 mg twice daily for breakthrough pain.  Evaluated and did do osteopathic manipulation again today.  Follow-up again in 6 to 8-week.  This will be for further evaluation of his back pain that could have been exacerbated from the knee pain as well.

## 2022-07-23 NOTE — Progress Notes (Signed)
[name] [address] [place] [address] Phone: [phone] Subjective:    I'm seeing this patient by the request  of:  Henderson Cloud, MD  CC: Back and neck pain follow-up  [id number]  AYYUB KRALL Sr. is a 46 y.o. male coming in with complaint of back and neck pain. OMT 06/15/2022. Patient states that shoulder and neck , left anterior knee pain sharp shooting pain hx of torn meniscus patient has not been quite as active.  Has noticed more tightness in his back and neck secondary to this.  Medications patient has been prescribed: None  Taking:         Reviewed prior external information including notes and imaging from previsou exam, outside providers and external EMR if available.   As well as notes that were available from care everywhere and other healthcare systems.  Past medical history, social, surgical and family history all reviewed in electronic medical record.  No pertanent information unless stated regarding to the chief complaint.   Past Medical History:  Diagnosis Date   Allergy    Anxiety    Cataract    Fibromyalgia    Glaucoma    Legally blind     Allergies  Allergen Reactions   Citrus Anaphylaxis   Other Hives   Latex Rash    welts     Review of Systems:  No headache, visual changes, nausea, vomiting, diarrhea, constipation, dizziness, abdominal pain, skin rash, fevers, chills, night sweats, weight loss, swollen lymph nodes, body aches, joint swelling, chest pain, shortness of breath, mood changes. POSITIVE muscle aches  Objective  Blood pressure 122/82, pulse 84, height 6\' 2"  (1.88 m), weight 259 lb (117.5 kg), SpO2 98 %.   General: No apparent distress alert and oriented x3 mood and affect normal, dressed appropriately.  HEENT: Pupils equal, extraocular movements intact  Respiratory: Patient's speak in full sentences and does not appear short of breath  Cardiovascular: No lower  extremity edema, non tender, no erythema  Gait antalgic gait with patient having tightness with McMurray's.  Patient even has severe pain over the medial joint line.  Trace effusion noted. MSK:  Back neck exam does have some loss lordosis.  Tightness noted in the paraspinal musculature especially over the lumbar spine right greater than left.  Patient also has significant tightness of the right noted as well.  Negative Spurling's noted.  Osteopathic findings  C3 flexed rotated and side bent right C5 flexed rotated and side bent left T4 extended rotated and side bent right inhaled rib T8 extended rotated and side bent left L2 flexed rotated and side bent right Sacrum right on right     Assessment and Plan:  Degenerative arthritis of knee Some known arthritic changes but having more increasing instability.  Has had meniscal tear responded well previously to the conservative therapy.  Failed 6 weeks of prescribed exercises multiple times.  I do feel that advanced imaging is warranted to further evaluate.  Follow-up again in 6 to 8 weeks    Nonallopathic problems  Decision today to treat with OMT was based on Physical Exam  After verbal consent patient was treated with HVLA, ME, FPR techniques in cervical, rib, thoracic, lumbar, and sacral  areas  Patient tolerated the procedure well with improvement in symptoms  Patient given exercises, stretches and lifestyle modifications  See medications in patient instructions if given  Patient will follow up in 4-8 weeks    The above documentation  has been reviewed and is accurate and complete Lyndal Pulley, DO          Note: This dictation was prepared with Dragon dictation along with smaller phrase technology. Any transcriptional errors that result from this process are unintentional.

## 2022-07-27 ENCOUNTER — Ambulatory Visit: Payer: Medicare HMO | Admitting: Family Medicine

## 2022-07-27 VITALS — BP 122/82 | HR 84 | Ht 74.0 in | Wt 259.0 lb

## 2022-07-27 DIAGNOSIS — M1712 Unilateral primary osteoarthritis, left knee: Secondary | ICD-10-CM

## 2022-07-27 DIAGNOSIS — M9901 Segmental and somatic dysfunction of cervical region: Secondary | ICD-10-CM

## 2022-07-27 DIAGNOSIS — M9908 Segmental and somatic dysfunction of rib cage: Secondary | ICD-10-CM

## 2022-07-27 DIAGNOSIS — M9904 Segmental and somatic dysfunction of sacral region: Secondary | ICD-10-CM

## 2022-07-27 DIAGNOSIS — M9902 Segmental and somatic dysfunction of thoracic region: Secondary | ICD-10-CM

## 2022-07-27 DIAGNOSIS — M9903 Segmental and somatic dysfunction of lumbar region: Secondary | ICD-10-CM

## 2022-07-27 DIAGNOSIS — S83207A Unspecified tear of unspecified meniscus, current injury, left knee, initial encounter: Secondary | ICD-10-CM

## 2022-07-27 NOTE — Patient Instructions (Addendum)
Good to see you as always We will get MRI of the left knee to see what is going on See me again for back and neck in 7-8 weeks Happy holidays!

## 2022-07-27 NOTE — Assessment & Plan Note (Signed)
Some known arthritic changes but having more increasing instability.  Has had meniscal tear responded well previously to the conservative therapy.  Failed 6 weeks of prescribed exercises multiple times.  I do feel that advanced imaging is warranted to further evaluate.  Follow-up again in 6 to 8 weeks

## 2022-08-15 ENCOUNTER — Ambulatory Visit
Admission: RE | Admit: 2022-08-15 | Discharge: 2022-08-15 | Disposition: A | Discharge status: Home / Self Care | Payer: Medicare HMO | Source: Ambulatory Visit | Attending: Family Medicine | Admitting: Family Medicine

## 2022-08-15 DIAGNOSIS — S83207A Unspecified tear of unspecified meniscus, current injury, left knee, initial encounter: Secondary | ICD-10-CM

## 2022-08-24 ENCOUNTER — Other Ambulatory Visit: Payer: Self-pay

## 2022-08-24 ENCOUNTER — Telehealth: Payer: Self-pay | Admitting: Family Medicine

## 2022-08-24 DIAGNOSIS — M25562 Pain in left knee: Secondary | ICD-10-CM

## 2022-08-24 NOTE — Telephone Encounter (Signed)
Patient called asking if someone could call him to go over his MRI results. He does not have access to MyChart at this time and was not able to view the results.

## 2022-08-24 NOTE — Telephone Encounter (Signed)
Left message for patient to call back for results.  

## 2022-08-24 NOTE — Telephone Encounter (Signed)
Spoke with patient and he would like to go to ortho to speak with a Careers adviser. Referral placed.

## 2022-09-15 NOTE — Progress Notes (Signed)
[name] [address] [place] [address] Phone: [phone] Subjective:   [name], am serving as a scribe for Dr. Antoine Primas.  I'm seeing this patient by the request  of:  Henderson Cloud, MD  CC: Knee pain, back pain follow-up  [id number]  Bryce Pillar Sr. is a 47 y.o. male coming in with complaint of back and neck pain. OMT 07/27/2022. Also f/u for L knee pain. Patient states that he had to postpone surgery due to past due bill.   Also continues to have R shoulder and neck pain.   Medications patient has been prescribed: None  Taking:         Reviewed prior external information including notes and imaging from previsou exam, outside providers and external EMR if available.   As well as notes that were available from care everywhere and other healthcare systems.  Past medical history, social, surgical and family history all reviewed in electronic medical record.  No pertanent information unless stated regarding to the chief complaint.   Past Medical History:  Diagnosis Date   Allergy    Anxiety    Cataract    Fibromyalgia    Glaucoma    Legally blind     Allergies  Allergen Reactions   Citrus Anaphylaxis   Other Hives   Latex Rash    welts     Review of Systems:  No headache, visual changes, nausea, vomiting, diarrhea, constipation, dizziness, abdominal pain, skin rash, fevers, chills, night sweats, weight loss, swollen lymph nodes, body aches, joint swelling, chest pain, shortness of breath, mood changes. POSITIVE muscle aches  Objective  Blood pressure 110/82, pulse 80, height 6\' 2"  (1.88 m), weight 253 lb (114.8 kg).   General: No apparent distress alert and oriented x3 mood and affect normal, dressed appropriately.  HEENT: Pupils equal, extraocular movements intact  Respiratory: Patient's speak in full sentences and does not appear short of breath  Cardiovascular: No lower extremity  edema, non tender, no erythema  Neck exam does have some loss of lordosis.  Tightness noted on the right side of the neck.  Patient does have some limited sidebending bilaterally right greater than left. Knee exam does have some tenderness to palpation over the paraspinal musculature of the knee.  Patient does have tenderness over the medial joint line and positive McMurray's noted.  Osteopathic findings  C5 flexed rotated and side bent right T3 extended rotated and side bent right inhaled rib L3 flexed rotated and side bent right L4 flexed rotated and side bent left Sacrum right on right       Assessment and Plan:  Acute meniscal tear of knee, left, initial encounter Seen on MRI again.  Discussed with patient about icing regimen and home exercises.  Patellofemoral arthritis of both knees as well.  MRI is in my chart, referred to orthopedic surgeon to discuss potential surgical intervention with patient failing all conservative therapy including injections, formal physical therapy, home exercises and bracing.  Degenerative disc disease, cervical Degenerative disc disease, still tightness on the right greater than left.  Does usually respond well to osteopathic manipulation.  Discussed posture and ergonomics, discussed which activities to do and which ones to avoid.  Follow-up again in 6 to 8 weeks    Nonallopathic problems  Decision today to treat with OMT was based on Physical Exam  After verbal consent patient was treated with HVLA, ME, FPR techniques in cervical, rib, thoracic, lumbar, and  sacral  areas  Patient tolerated the procedure well with improvement in symptoms  Patient given exercises, stretches and lifestyle modifications  See medications in patient instructions if given  Patient will follow up in 4-8 weeks      The above documentation has been reviewed and is accurate and complete Lyndal Pulley, DO        Note: This dictation was prepared with Dragon  dictation along with smaller phrase technology. Any transcriptional errors that result from this process are unintentional.

## 2022-09-21 ENCOUNTER — Ambulatory Visit: Payer: Medicare HMO | Admitting: Family Medicine

## 2022-09-21 VITALS — BP 110/82 | HR 80 | Ht 74.0 in | Wt 253.0 lb

## 2022-09-21 DIAGNOSIS — M9901 Segmental and somatic dysfunction of cervical region: Secondary | ICD-10-CM | POA: Diagnosis not present

## 2022-09-21 DIAGNOSIS — M9904 Segmental and somatic dysfunction of sacral region: Secondary | ICD-10-CM | POA: Diagnosis not present

## 2022-09-21 DIAGNOSIS — M9902 Segmental and somatic dysfunction of thoracic region: Secondary | ICD-10-CM | POA: Diagnosis not present

## 2022-09-21 DIAGNOSIS — S83207A Unspecified tear of unspecified meniscus, current injury, left knee, initial encounter: Secondary | ICD-10-CM | POA: Diagnosis not present

## 2022-09-21 DIAGNOSIS — M5030 Other cervical disc degeneration, unspecified cervical region: Secondary | ICD-10-CM

## 2022-09-21 DIAGNOSIS — M9908 Segmental and somatic dysfunction of rib cage: Secondary | ICD-10-CM | POA: Diagnosis not present

## 2022-09-21 DIAGNOSIS — J302 Other seasonal allergic rhinitis: Secondary | ICD-10-CM

## 2022-09-21 DIAGNOSIS — M9903 Segmental and somatic dysfunction of lumbar region: Secondary | ICD-10-CM

## 2022-09-21 MED ORDER — MONTELUKAST SODIUM 10 MG PO TABS: 10.0000 mg | ORAL_TABLET | Freq: Every day | ORAL | 0 refills | Status: DC

## 2022-09-21 NOTE — Patient Instructions (Addendum)
Referral to Dr. Virl Son called in See me in 5-6 weeks

## 2022-09-21 NOTE — Assessment & Plan Note (Signed)
Degenerative disc disease, still tightness on the right greater than left.  Does usually respond well to osteopathic manipulation.  Discussed posture and ergonomics, discussed which activities to do and which ones to avoid.  Follow-up again in 6 to 8 weeks

## 2022-09-21 NOTE — Assessment & Plan Note (Signed)
Given Singulair to try to see if this will be beneficial.  Warned of potential side effects as well as black box warning.  Will see how patient responds.

## 2022-09-21 NOTE — Assessment & Plan Note (Signed)
Seen on MRI again.  Discussed with patient about icing regimen and home exercises.  Patellofemoral arthritis of both knees as well.  MRI is in my chart, referred to orthopedic surgeon to discuss potential surgical intervention with patient failing all conservative therapy including injections, formal physical therapy, home exercises and bracing.

## 2022-10-12 ENCOUNTER — Ambulatory Visit (INDEPENDENT_AMBULATORY_CARE_PROVIDER_SITE_OTHER): Payer: Medicare HMO | Admitting: Orthopaedic Surgery

## 2022-10-12 ENCOUNTER — Encounter: Payer: Self-pay | Admitting: Orthopaedic Surgery

## 2022-10-12 DIAGNOSIS — M25562 Pain in left knee: Secondary | ICD-10-CM | POA: Diagnosis not present

## 2022-10-12 DIAGNOSIS — G8929 Other chronic pain: Secondary | ICD-10-CM

## 2022-10-12 NOTE — Progress Notes (Signed)
The patient is a very pleasant 47 year old gentleman that I am seeing for the first time sent over appropriately by Dr. Hulan Saas to evaluate and treat his left knee.  He reports that 8-year history of knee issues with that knee that all started with different types of accidents.  Originally he was diagnosed with a medial meniscal tear.  He is always elected nonoperative treatment which is certainly reasonable.  He does have a history of glaucoma so he has to be careful with eye pressure.  He has had steroid injections and hyaluronic acid injections in his left knee over time which have helped.  He continues to have pain along the medial aspect of his knee.  Recent MRI was obtained of the left knee and he was sent here for further discussions and evaluation treatment of his left knee.  Again he still points to the medial aspect of his knee as a source of his pain.  On examination there is slight varus malalignment of his left knee that is easily correctable.  He has no joint effusion today.  He has medial joint line tenderness but not a true McMurray's sign to the medial compartment.  His ligaments exam is otherwise stable.  There is no instability on varus and valgus stressing and his Lachman's exam is negative.  The MRI of his left knee is reviewed with him.  There is only some fraying of the medial meniscus on this recent MRI with some slight extrusion of the meniscus medially.  There is moderate thinning of the articular cartilage of the medial compartment of his knee but no full-thickness cartilage defects.  The remainder of the knee is pristine in terms of the cartilage of the patellofemoral joint and lateral compartment.  The ACL and PCL and collateral ligaments are all intact and the lateral meniscus is intact.  I showed him a knee model and explained in detail what is going on with his knee.  I believe most of his pain is likely related to arthritis in that medial compartment.  Certainly he could  have a chronic meniscal tear that is not seen on MRI which is a static study especially if it is so chronic that there is no fluid changes in the meniscus and I have actually seen that before on patients with a large flat meniscal tear that is reduced at the time of the MRI and showed no signal changes.  We did talk about an option of a surgical intervention which would be an arthroscopic intervention to further assess the inside of his knee from a diagnostic and hopefully therapeutic standpoint.  He understands that knee arthroscopy is not warranted but just arthritic changes but certainly we could truly find out if there was a meniscal tear or not based on what he has been experiencing with his knee mechanically and the very conservative treatment.  I explained what this would involve in detail.  He said he will think about it and can get back with [place] in terms of considering a knee arthroscopic intervention.  All questions and concerns were addressed and answered.

## 2022-10-28 ENCOUNTER — Ambulatory Visit: Payer: Medicare HMO | Admitting: Family Medicine

## 2022-10-30 ENCOUNTER — Other Ambulatory Visit: Payer: Self-pay | Admitting: Family Medicine

## 2022-10-30 NOTE — Progress Notes (Unsigned)
[facility] [place] Phone: [phone] Subjective:   Bryce Bryce [name], am serving as a scribe for Dr. Hulan Saas.  I'm seeing this patient by the request  of:  Bryce Bryce [name], Bryce Halsted, MD  CC: back and neck pain   [id number]  Bryce Bryce [name] Sr. is a 47 y.o. male coming in with complaint of back and neck pain. OMT 09/20/2022. Patient states that he saw Dr. Rush Farmer for the L knee and was recommended that he get partial knee replacement.   Patient notes knot in R trap for past 5 days that will not let go.   Medications patient has been prescribed: Singulair  Taking:         Reviewed prior external information including notes and imaging from previsou exam, outside providers and external EMR if available.   As well as notes that were available from care everywhere and other healthcare systems.  Past medical history, social, surgical and family history all reviewed in electronic medical record.  Bryce [name] pertanent information unless stated regarding to the chief complaint.   Past Medical History:  Diagnosis Date   Allergy    Anxiety    Cataract    Fibromyalgia    Glaucoma    Legally blind     Allergies  Allergen Reactions   Citrus Anaphylaxis   Other Hives   Latex Rash    welts     Review of Systems:  Bryce [name] headache, visual changes, nausea, vomiting, diarrhea, constipation, dizziness, abdominal pain, skin rash, fevers, chills, night sweats, weight loss, swollen lymph nodes, body aches, joint swelling, chest pain, shortness of breath, mood changes. POSITIVE muscle aches  Objective  Blood pressure 118/82, pulse 78, height '6\' 2"'$  (1.88 m), weight 255 lb (115.7 kg), SpO2 98 %.   General: Bryce [name] apparent distress alert and oriented x3 mood and affect normal, dressed appropriately.  HEENT: Pupils equal, extraocular movements intact  Respiratory: Patient's speak in full sentences and does not appear short of breath   Cardiovascular: Bryce [name] lower extremity edema, non tender, Bryce [name] erythema  Back exam does have some loss of lordosis.  Mild antalgic gait noted.  Patient does have tightness noted in the paraspinal musculature of the lumbar spine right greater than left.  Patient also has significant tightness at the base of his neck on the right side today.  Negative Spurling's. Right knee to palpation over the medial joint line and positive McMurray still noted.  Osteopathic findings  C2 flexed rotated and side bent right C C7 flexed rotated and side bent right T3 extended rotated and side bent right inhaled rib T9 extended rotated and side bent left L2 flexed rotated and side bent right Sacrum right on right       Assessment and Plan:  Acute meniscal tear of knee, left, initial encounter Patient does have more of the acute meniscal tear noted.  Patient did see a provider who did think of more of a partial knee replacement.  Patient is not willing to do that.  Can consider another injection if necessary.  Follow-up with me again in 6 to 8 weeks.  Degenerative arthritis of knee Once we discussed again.  Has had this for many years.  He may need a repeat injection if necessary.  Back pain Some of it could be potential compensation for patient's knee pain.  We discussed continuing with the weight loss. Patient is down to 255 pounds.  BMI is at 32.  This does make him a surgical candidate if needed for the knee.  Does respond well to manipulation.  That he even did have some neck pain as well today.  Follow-up again in 6 to 8 weeks    Nonallopathic problems  Decision today to treat with OMT was based on Physical Exam  After verbal consent patient was treated with HVLA, ME, FPR techniques in cervical, rib, thoracic, lumbar, and sacral  areas  Patient tolerated the procedure well with improvement in symptoms  Patient given exercises, stretches and lifestyle modifications  See medications in patient  instructions if given  Patient will follow up in 4-8 weeks     The above documentation has been reviewed and is accurate and complete Bryce Pulley, DO         Note: This dictation was prepared with Dragon dictation along with smaller phrase technology. Any transcriptional errors that result from this process are unintentional.

## 2022-11-02 ENCOUNTER — Ambulatory Visit (INDEPENDENT_AMBULATORY_CARE_PROVIDER_SITE_OTHER): Payer: Medicare HMO | Admitting: Family Medicine

## 2022-11-02 ENCOUNTER — Encounter: Payer: Self-pay | Admitting: Family Medicine

## 2022-11-02 VITALS — BP 118/82 | HR 78 | Ht 74.0 in | Wt 255.0 lb

## 2022-11-02 DIAGNOSIS — M9904 Segmental and somatic dysfunction of sacral region: Secondary | ICD-10-CM

## 2022-11-02 DIAGNOSIS — S83207A Unspecified tear of unspecified meniscus, current injury, left knee, initial encounter: Secondary | ICD-10-CM | POA: Diagnosis not present

## 2022-11-02 DIAGNOSIS — G8929 Other chronic pain: Secondary | ICD-10-CM

## 2022-11-02 DIAGNOSIS — M9901 Segmental and somatic dysfunction of cervical region: Secondary | ICD-10-CM | POA: Diagnosis not present

## 2022-11-02 DIAGNOSIS — M1712 Unilateral primary osteoarthritis, left knee: Secondary | ICD-10-CM

## 2022-11-02 DIAGNOSIS — M9903 Segmental and somatic dysfunction of lumbar region: Secondary | ICD-10-CM

## 2022-11-02 DIAGNOSIS — M9902 Segmental and somatic dysfunction of thoracic region: Secondary | ICD-10-CM | POA: Diagnosis not present

## 2022-11-02 DIAGNOSIS — M545 Low back pain, unspecified: Secondary | ICD-10-CM | POA: Diagnosis not present

## 2022-11-02 DIAGNOSIS — M9908 Segmental and somatic dysfunction of rib cage: Secondary | ICD-10-CM

## 2022-11-02 NOTE — Assessment & Plan Note (Signed)
Patient does have more of the acute meniscal tear noted.  Patient did see a provider who did think of more of a partial knee replacement.  Patient is not willing to do that.  Can consider another injection if necessary.  Follow-up with me again in 6 to 8 weeks.

## 2022-11-02 NOTE — Patient Instructions (Signed)
Good to see you  Happy birthday! Stay active and keep working on the weight See me again in 6-8 weeks

## 2022-11-02 NOTE — Assessment & Plan Note (Signed)
Some of it could be potential compensation for patient's knee pain.  We discussed continuing with the weight loss. Patient is down to 255 pounds.  BMI is at 32.  This does make him a surgical candidate if needed for the knee.  Does respond well to manipulation.  That he even did have some neck pain as well today.  Follow-up again in 6 to 8 weeks

## 2022-11-02 NOTE — Assessment & Plan Note (Signed)
Once we discussed again.  Has had this for many years.  He may need a repeat injection if necessary.

## 2022-12-06 ENCOUNTER — Other Ambulatory Visit: Payer: Self-pay | Admitting: Family Medicine

## 2022-12-22 NOTE — Progress Notes (Unsigned)
[name] [address] [place] [address] Phone: [phone] Subjective:   Bryce [name], am serving as a scribe for Dr. Antoine Primas.  I'm seeing this patient by the request  of:  [name], [name], MD  CC: Back and neck pain follow-up  [id number]  Bryce GARLOW Sr. is a 47 y.o. male coming in with complaint of back and neck pain. OMT 11/02/2022. Patient states doing well since last visit. Had recent eye surgery on the 20th. Same per usual. No new concerns.  Medications patient has been prescribed: Singulair  Taking:         Reviewed prior external information including notes and imaging from previsou exam, outside providers and external EMR if available.   As well as notes that were available from care everywhere and other healthcare systems.  Past medical history, social, surgical and family history all reviewed in electronic medical record.  No pertanent information unless stated regarding to the chief complaint.   Past Medical History:  Diagnosis Date   Allergy    Anxiety    Cataract    Fibromyalgia    Glaucoma    Legally blind     Allergies  Allergen Reactions   Citrus Anaphylaxis   Other Hives   Latex Rash    welts     Review of Systems:  No headache, visual changes, nausea, vomiting, diarrhea, constipation, dizziness, abdominal pain, skin rash, fevers, chills, night sweats, weight loss, swollen lymph nodes, body aches, joint swelling, chest pain, shortness of breath, mood changes. POSITIVE muscle aches  Objective  Blood pressure 126/86, pulse 67, height  (1.88 m), weight 249 lb (112.9 kg), SpO2 98 %.   General: No apparent distress alert and oriented x3 mood and affect normal, dressed appropriately.  HEENT: Pupils equal, extraocular movements intact  Respiratory: Patient's speak in full sentences and does not appear short of breath  Cardiovascular: No lower extremity edema, non tender, no  erythema  Low back exam does have some loss lordosis noted.  Some tenderness to palpation in the paraspinal musculature.  Neck exam does have some limited sidebending bilaterally.  Patient does seem to be more tight on the right side of the neck.  Osteopathic findings  C2 flexed rotated and side bent right C6 flexed rotated and side bent left T3 extended rotated and side bent right inhaled rib T9 extended rotated and side bent right L2 flexed rotated and side bent right L5 flexed rotated and side bent left Sacrum right on right       Assessment and Plan:  Degenerative disc disease, cervical Exacerbation recently after patient had an eye surgery for his glaucoma.  Avoiding any high velocity.  Will give more muscle energy techniques today.  Discussed with patient about icing regimen and home exercises.  Discussed the gabapentin.  Discussed which activities to do.  Patient has been losing weight and encouraged him to continue to do so with approximately another 10 pounds to go.  Increase activity slowly.  Follow-up again in 6 to 8 weeks.    Nonallopathic problems  Decision today to treat with OMT was based on Physical Exam  After verbal consent patient was treated with HVLA, ME, FPR techniques in cervical, rib, thoracic, lumbar, and sacral  areas  Patient tolerated the procedure well with improvement in symptoms  Patient given exercises, stretches and lifestyle modifications  See medications in patient instructions if given  Patient will follow up in 4-8 weeks  Note: This dictation was prepared with Dragon dictation along with smaller phrase technology. Any transcriptional errors that result from this process are unintentional.

## 2022-12-23 ENCOUNTER — Ambulatory Visit (INDEPENDENT_AMBULATORY_CARE_PROVIDER_SITE_OTHER): Payer: Medicare HMO | Admitting: Family Medicine

## 2022-12-23 ENCOUNTER — Encounter: Payer: Self-pay | Admitting: Family Medicine

## 2022-12-23 VITALS — BP 126/86 | HR 67 | Ht 74.0 in | Wt 249.0 lb

## 2022-12-23 DIAGNOSIS — M9904 Segmental and somatic dysfunction of sacral region: Secondary | ICD-10-CM | POA: Diagnosis not present

## 2022-12-23 DIAGNOSIS — M9903 Segmental and somatic dysfunction of lumbar region: Secondary | ICD-10-CM

## 2022-12-23 DIAGNOSIS — M5030 Other cervical disc degeneration, unspecified cervical region: Secondary | ICD-10-CM

## 2022-12-23 DIAGNOSIS — M9901 Segmental and somatic dysfunction of cervical region: Secondary | ICD-10-CM

## 2022-12-23 DIAGNOSIS — M9908 Segmental and somatic dysfunction of rib cage: Secondary | ICD-10-CM

## 2022-12-23 DIAGNOSIS — M9902 Segmental and somatic dysfunction of thoracic region: Secondary | ICD-10-CM

## 2022-12-23 NOTE — Assessment & Plan Note (Signed)
Exacerbation recently after patient had an eye surgery for his glaucoma.  Avoiding any high velocity.  Will give more muscle energy techniques today.  Discussed with patient about icing regimen and home exercises.  Discussed the gabapentin.  Discussed which activities to do.  Patient has been losing weight and encouraged him to continue to do so with approximately another 10 pounds to go.  Increase activity slowly.  Follow-up again in 6 to 8 weeks.

## 2022-12-23 NOTE — Patient Instructions (Signed)
Good to see you! We'll see how the playoffs go Glad the eye's doing well See you again in 6-8 weeks

## 2023-01-06 ENCOUNTER — Other Ambulatory Visit: Payer: Self-pay | Admitting: Family Medicine

## 2023-02-02 ENCOUNTER — Other Ambulatory Visit: Payer: Self-pay | Admitting: Family Medicine

## 2023-02-10 ENCOUNTER — Ambulatory Visit: Payer: Medicare HMO | Admitting: Family Medicine

## 2023-02-24 NOTE — Progress Notes (Signed)
[name] [address] [place] [address] Phone: [phone] Subjective:   Bryce [name], am serving as a scribe for Dr. Antoine Primas.  I'm seeing this patient by the request  of:  Bryce [name], Bryce Patricia, MD  CC: Back and neck pain follow-up  [id number]  Bryce GERGES Sr. is a 47 y.o. male coming in with complaint of back and neck pain. OMT 12/23/2022. Patient states that the R trap is tight and painful. Feels weakness in R distal tricep.   Medications patient has been prescribed:   Taking:         Reviewed prior external information including notes and imaging from previsou exam, outside providers and external EMR if available.   As well as notes that were available from care everywhere and other healthcare systems.  Past medical history, social, surgical and family history all reviewed in electronic medical record.  No pertanent information unless stated regarding to the chief complaint.   Past Medical History:  Diagnosis Date   Allergy    Anxiety    Cataract    Fibromyalgia    Glaucoma    Legally blind     Allergies  Allergen Reactions   Citrus Anaphylaxis   Other Hives   Latex Rash    welts     Review of Systems:  No headache, visual changes, nausea, vomiting, diarrhea, constipation, dizziness, abdominal pain, skin rash, fevers, chills, night sweats, weight loss, swollen lymph nodes, body aches, joint swelling, chest pain, shortness of breath, mood changes. POSITIVE muscle aches  Objective  Blood pressure (!) 122/92, pulse 96, height 6\' 2"  (1.88 m), weight 238 lb (108 kg), SpO2 98 %.   General: No apparent distress alert and oriented x3 mood and affect normal, dressed appropriately.  HEENT: Pupils equal, extraocular movements intact  Respiratory: Patient's speak in full sentences and does not appear short of breath  Cardiovascular: No lower extremity edema, non tender, no erythema  Low back exam does have  some loss lordosis noted.  Some tenderness to palpation in the paraspinal musculature.  Positive FABER test noted as well.  More tightness on the right side of the neck than usual  Osteopathic findings  C2 flexed rotated and side bent right C6 flexed rotated and side bent right T3 extended rotated and side bent right inhaled rib T9 extended rotated and side bent left L2 flexed rotated and side bent right Sacrum right on right       Assessment and Plan:  Degenerative disc disease, cervical Chronic problem with exacerbation with tightness noted on the right side of the neck.  Did have the eye surgery that I think contributed to this with the patient healing aspect in a different ergonomics he had to have initially.  Discussed home exercises and icing regimen discussed increasing activity slowly.  Follow-up again in 6 to 8 weeks    Nonallopathic problems  Decision today to treat with OMT was based on Physical Exam  After verbal consent patient was treated with HVLA, ME, FPR techniques in cervical, rib, thoracic, lumbar, and sacral  areas  Patient tolerated the procedure well with improvement in symptoms  Patient given exercises, stretches and lifestyle modifications  See medications in patient instructions if given  Patient will follow up in 4-8 weeks    The above documentation has been reviewed and is accurate and complete [name], DO          Note: This dictation was prepared  with Dragon dictation along with smaller phrase technology. Any transcriptional errors that result from this process are unintentional.

## 2023-03-03 ENCOUNTER — Encounter: Payer: Self-pay | Admitting: Family Medicine

## 2023-03-03 ENCOUNTER — Ambulatory Visit (INDEPENDENT_AMBULATORY_CARE_PROVIDER_SITE_OTHER): Payer: Medicare HMO | Admitting: Family Medicine

## 2023-03-03 VITALS — BP 122/92 | HR 96 | Ht 74.0 in | Wt 238.0 lb

## 2023-03-03 DIAGNOSIS — M9903 Segmental and somatic dysfunction of lumbar region: Secondary | ICD-10-CM | POA: Diagnosis not present

## 2023-03-03 DIAGNOSIS — M9908 Segmental and somatic dysfunction of rib cage: Secondary | ICD-10-CM

## 2023-03-03 DIAGNOSIS — M9902 Segmental and somatic dysfunction of thoracic region: Secondary | ICD-10-CM | POA: Diagnosis not present

## 2023-03-03 DIAGNOSIS — M9904 Segmental and somatic dysfunction of sacral region: Secondary | ICD-10-CM | POA: Diagnosis not present

## 2023-03-03 DIAGNOSIS — M9901 Segmental and somatic dysfunction of cervical region: Secondary | ICD-10-CM

## 2023-03-03 DIAGNOSIS — M5030 Other cervical disc degeneration, unspecified cervical region: Secondary | ICD-10-CM | POA: Diagnosis not present

## 2023-03-03 NOTE — Assessment & Plan Note (Signed)
Chronic problem with exacerbation with tightness noted on the right side of the neck.  Did have the eye surgery that I think contributed to this with the patient healing aspect in a different ergonomics he had to have initially.  Discussed home exercises and icing regimen discussed increasing activity slowly.  Follow-up again in 6 to 8 weeks

## 2023-03-03 NOTE — Patient Instructions (Signed)
Good to see you Glad surgery went well See me in 7-8 weeks

## 2023-04-20 NOTE — Progress Notes (Unsigned)
[name] [address] [place] [address] Phone: [phone] Subjective:   [name], am serving as a scribe for Dr. Antoine Primas.  I'm seeing this patient by the request  of:  [name], [name], MD  CC: Neck and back pain follow-up  [id number]  Bryce THIESEN Sr. is a 47 y.o. male coming in with complaint of back and neck pain. OMT 03/03/2023. Patient states same per usual. No new complaints. B 3rd finger pain at DIP.  Medications patient has been prescribed: None  Taking:         Reviewed prior external information including notes and imaging from previsou exam, outside providers and external EMR if available.   As well as notes that were available from care everywhere and other healthcare systems.  Past medical history, social, surgical and family history all reviewed in electronic medical record.  No pertanent information unless stated regarding to the chief complaint.   Past Medical History:  Diagnosis Date   Allergy    Anxiety    Cataract    Fibromyalgia    Glaucoma    Legally blind     Allergies  Allergen Reactions   Citrus Anaphylaxis   Other Hives   Latex Rash    welts     Review of Systems:  No headache, visual changes, nausea, vomiting, diarrhea, constipation, dizziness, abdominal pain, skin rash, fevers, chills, night sweats, weight loss, swollen lymph nodes, body aches, joint swelling, chest pain, shortness of breath, mood changes. POSITIVE muscle aches  Objective  Pulse 61, height 6\' 2"  (1.88 m), weight 233 lb (105.7 kg), SpO2 98%.   General: No apparent distress alert and oriented x3 mood and affect normal, dressed appropriately.  HEENT: Pupils equal, extraocular movements intact  Respiratory: Patient's speak in full sentences and does not appear short of breath  Cardiovascular: No lower extremity edema, non tender, no erythema  Patient is doing relatively well and has lost some weight.   Patient has gained a little bit of upper body strength at the moment.  Osteopathic findings  C2 flexed rotated and side bent right C6 flexed rotated and side bent left T3 extended rotated and side bent right inhaled rib T9 extended rotated and side bent left L2 flexed rotated and side bent right Sacrum right on right       Assessment and Plan:  Degenerative disc disease, cervical Patient does have degenerative disc disease.  Did have more tightness noted in the neck.  Has been lifting more.  Discussed proper lifting mechanics and stretching on a regular basis.  Patient is to keep treatment options more holistic at this time.  Discussed continuing with the weight loss and follow-up with me again in 6 to 8 weeks otherwise.    Nonallopathic problems  Decision today to treat with OMT was based on Physical Exam  After verbal consent patient was treated with HVLA, ME, FPR techniques in cervical, rib, thoracic, lumbar, and sacral  areas  Patient tolerated the procedure well with improvement in symptoms  Patient given exercises, stretches and lifestyle modifications  See medications in patient instructions if given  Patient will follow up in 4-8 weeks     The above documentation has been reviewed and is accurate and complete [name], DO         Note: This dictation was prepared with Dragon dictation along with smaller phrase technology. Any transcriptional errors that result from this process are unintentional.

## 2023-04-21 ENCOUNTER — Ambulatory Visit: Payer: Medicare Other | Admitting: Family Medicine

## 2023-04-21 ENCOUNTER — Encounter: Payer: Self-pay | Admitting: Family Medicine

## 2023-04-21 VITALS — HR 61 | Ht 74.0 in | Wt 233.0 lb

## 2023-04-21 DIAGNOSIS — M9901 Segmental and somatic dysfunction of cervical region: Secondary | ICD-10-CM

## 2023-04-21 DIAGNOSIS — M9904 Segmental and somatic dysfunction of sacral region: Secondary | ICD-10-CM

## 2023-04-21 DIAGNOSIS — M9903 Segmental and somatic dysfunction of lumbar region: Secondary | ICD-10-CM | POA: Diagnosis not present

## 2023-04-21 DIAGNOSIS — M9908 Segmental and somatic dysfunction of rib cage: Secondary | ICD-10-CM

## 2023-04-21 DIAGNOSIS — M5030 Other cervical disc degeneration, unspecified cervical region: Secondary | ICD-10-CM

## 2023-04-21 DIAGNOSIS — M9902 Segmental and somatic dysfunction of thoracic region: Secondary | ICD-10-CM

## 2023-04-21 NOTE — Assessment & Plan Note (Signed)
Patient does have degenerative disc disease.  Did have more tightness noted in the neck.  Has been lifting more.  Discussed proper lifting mechanics and stretching on a regular basis.  Patient is to keep treatment options more holistic at this time.  Discussed continuing with the weight loss and follow-up with me again in 6 to 8 weeks otherwise.

## 2023-04-21 NOTE — Patient Instructions (Signed)
Good to see you! Keep working out See you again in 2 months

## 2023-06-15 NOTE — Progress Notes (Signed)
[name] [address] [place] [address] Phone: [phone] Subjective:   [name], am serving as a scribe for Dr. Antoine Primas.  I'm seeing this patient by the request  of:  [name], [name], MD  CC: Back and neck pain  [id number]  Bryce [name] Sr. is a 47 y.o. male coming in with complaint of back and neck pain. OMT 04/21/2023. Patient states doing well. Here for manipulation. No new concerns. Knee is doing well. Walking about 3 miles. Pinch nerve in neck bothers him more than anything.  Medications patient has been prescribed: Singulair  Taking:         Reviewed prior external information including notes and imaging from previsou exam, outside providers and external EMR if available.   As well as notes that were available from care everywhere and other healthcare systems.  Past medical history, social, surgical and family history all reviewed in electronic medical record.  No pertanent information unless stated regarding to the chief complaint.   Past Medical History:  Diagnosis Date   Allergy    Anxiety    Cataract    Fibromyalgia    Glaucoma    Legally blind     Allergies  Allergen Reactions   Citrus Anaphylaxis   Other Hives   Latex Rash    welts     Review of Systems:  No headache, visual changes, nausea, vomiting, diarrhea, constipation, dizziness, abdominal pain, skin rash, fevers, chills, night sweats, weight loss, swollen lymph nodes, body aches, joint swelling, chest pain, shortness of breath, mood changes. POSITIVE muscle aches  Objective  Blood pressure 122/80, pulse 70, height 6\' 2"  (1.88 m), weight 226 lb (102.5 kg), SpO2 96%.   General: No apparent distress alert and oriented x3 mood and affect normal, dressed appropriately.  HEENT: Pupils equal, extraocular movements intact  Respiratory: Patient's speak in full sentences and does not appear short of breath  Cardiovascular: No lower  extremity edema, non tender, no erythema  Neck exam does have some loss lordosis noted.  Some tenderness to palpation of the paraspinal musculature.  Patient does have some limitation with right-sided rotation and sidebending.  Osteopathic findings  C2 flexed rotated and side bent right C7 flexed rotated and side bent right T3 extended rotated and side bent right inhaled rib T9 extended rotated and side bent left L2 flexed rotated and side bent right Sacrum right on right       Assessment and Plan:  Degenerative disc disease, cervical Exacerbation noted, avoiding prednisone or other steroids secondary to patient's glaucoma again this seems to be increasing again.  Discussed icing regimen and home exercises, discussed which activities to do and which ones to avoid.  Increase activity slowly otherwise.  Follow-up with me again in 6 to 8 weeks    Nonallopathic problems  Decision today to treat with OMT was based on Physical Exam  After verbal consent patient was treated with HVLA, ME, FPR techniques in cervical, rib, thoracic, lumbar, and sacral  areas  Patient tolerated the procedure well with improvement in symptoms  Patient given exercises, stretches and lifestyle modifications  See medications in patient instructions if given  Patient will follow up in 4-8 weeks     The above documentation has been reviewed and is accurate and complete [name], DO         Note: This dictation was prepared with Dragon dictation along with smaller phrase technology. Any transcriptional errors  that result from this process are unintentional.

## 2023-06-22 ENCOUNTER — Ambulatory Visit: Payer: Medicare HMO | Admitting: Family Medicine

## 2023-06-22 ENCOUNTER — Encounter: Payer: Self-pay | Admitting: Family Medicine

## 2023-06-22 VITALS — BP 122/80 | HR 70 | Ht 74.0 in | Wt 226.0 lb

## 2023-06-22 DIAGNOSIS — M9901 Segmental and somatic dysfunction of cervical region: Secondary | ICD-10-CM | POA: Diagnosis not present

## 2023-06-22 DIAGNOSIS — M5030 Other cervical disc degeneration, unspecified cervical region: Secondary | ICD-10-CM | POA: Diagnosis not present

## 2023-06-22 DIAGNOSIS — M9908 Segmental and somatic dysfunction of rib cage: Secondary | ICD-10-CM

## 2023-06-22 DIAGNOSIS — M9902 Segmental and somatic dysfunction of thoracic region: Secondary | ICD-10-CM | POA: Diagnosis not present

## 2023-06-22 DIAGNOSIS — M9904 Segmental and somatic dysfunction of sacral region: Secondary | ICD-10-CM

## 2023-06-22 DIAGNOSIS — M9903 Segmental and somatic dysfunction of lumbar region: Secondary | ICD-10-CM | POA: Diagnosis not present

## 2023-06-22 NOTE — Assessment & Plan Note (Signed)
Exacerbation noted, avoiding prednisone or other steroids secondary to patient's glaucoma again this seems to be increasing again.  Discussed icing regimen and home exercises, discussed which activities to do and which ones to avoid.  Increase activity slowly otherwise.  Follow-up with me again in 6 to 8 weeks

## 2023-06-22 NOTE — Patient Instructions (Signed)
Good to see you! See you again in 5-6 weeks

## 2023-07-28 NOTE — Progress Notes (Unsigned)
  [name] [address] [place] [address] Phone: [phone] Subjective:   Bryce [name], am serving as a scribe for Dr. Antoine Primas.  I'm seeing this patient by the request  of:  Bryce [name], Bryce Patricia, MD  CC: Back and neck pain follow-up  [id number]  Bryce DEBOY Sr. is a 47 y.o. male coming in with complaint of back and neck pain. OMT 06/22/2023. Patient states same per usual. No new concerns.  Medications patient has been prescribed: None  Taking:         Reviewed prior external information including notes and imaging from previsou exam, outside providers and external EMR if available.   As well as notes that were available from care everywhere and other healthcare systems.  Past medical history, social, surgical and family history all reviewed in electronic medical record.  No pertanent information unless stated regarding to the chief complaint.   Past Medical History:  Diagnosis Date   Allergy    Anxiety    Cataract    Fibromyalgia    Glaucoma    Legally blind     Allergies  Allergen Reactions   Citrus Anaphylaxis   Other Hives   Latex Rash    welts     Review of Systems:  No headache, visual changes, nausea, vomiting, diarrhea, constipation, dizziness, abdominal pain, skin rash, fevers, chills, night sweats, weight loss, swollen lymph nodes, body aches, joint swelling, chest pain, shortness of breath, mood changes. POSITIVE muscle aches  Objective  Blood pressure 122/88, pulse 81, height 6\' 2"  (1.88 m), weight 226 lb (102.5 kg), SpO2 97%.   General: No apparent distress alert and oriented x3 mood and affect normal, dressed appropriately.  HEENT: Pupils equal, extraocular movements intact  Respiratory: Patient's speak in full sentences and does not appear short of breath  Cardiovascular: No lower extremity edema, non tender, no erythema  Gait MSK:  Back more tightness noted in the parascapular area  on the right side.  Tightness on the right side of the neck also noted.  Osteopathic findings  C2 flexed rotated and side bent right C6 flexed rotated and side bent right T3 extended rotated and side bent right inhaled rib T9 extended rotated and side bent left L2 flexed rotated and side bent right Sacrum right on right       Assessment and Plan:  Degenerative disc disease, cervical DDD discuss HEP  Patient does respond extremely well to osteopathic manipulation.  Discussed avoiding certain activities still.  Patient is working out and has done very well with improvement in core strengthening.  Follow-up again in 6 to 8 weeks    Nonallopathic problems  Decision today to treat with OMT was based on Physical Exam  After verbal consent patient was treated with HVLA, ME, FPR techniques in cervical, rib, thoracic, lumbar, and sacral  areas  Patient tolerated the procedure well with improvement in symptoms  Patient given exercises, stretches and lifestyle modifications  See medications in patient instructions if given  Patient will follow up in 4-8 weeks    The above documentation has been reviewed and is accurate and complete Bryce Saa, DO          Note: This dictation was prepared with Dragon dictation along with smaller phrase technology. Any transcriptional errors that result from this process are unintentional.

## 2023-07-29 ENCOUNTER — Ambulatory Visit: Payer: Medicare HMO | Admitting: Family Medicine

## 2023-07-29 ENCOUNTER — Encounter: Payer: Self-pay | Admitting: Family Medicine

## 2023-07-29 VITALS — BP 122/88 | HR 81 | Ht 74.0 in | Wt 226.0 lb

## 2023-07-29 DIAGNOSIS — M9903 Segmental and somatic dysfunction of lumbar region: Secondary | ICD-10-CM

## 2023-07-29 DIAGNOSIS — M5030 Other cervical disc degeneration, unspecified cervical region: Secondary | ICD-10-CM

## 2023-07-29 DIAGNOSIS — M9901 Segmental and somatic dysfunction of cervical region: Secondary | ICD-10-CM

## 2023-07-29 DIAGNOSIS — M9904 Segmental and somatic dysfunction of sacral region: Secondary | ICD-10-CM | POA: Diagnosis not present

## 2023-07-29 DIAGNOSIS — M9908 Segmental and somatic dysfunction of rib cage: Secondary | ICD-10-CM | POA: Diagnosis not present

## 2023-07-29 DIAGNOSIS — M9902 Segmental and somatic dysfunction of thoracic region: Secondary | ICD-10-CM

## 2023-07-29 NOTE — Assessment & Plan Note (Signed)
DDD discuss HEP  Patient does respond extremely well to osteopathic manipulation.  Discussed avoiding certain activities still.  Patient is working out and has done very well with improvement in core strengthening.  Follow-up again in 6 to 8 weeks

## 2023-07-29 NOTE — Patient Instructions (Signed)
Good to see you! Happy Holidays See you again in 7-8 weeks

## 2023-09-23 NOTE — Progress Notes (Signed)
  [name] [address] [place] [address] Phone: [phone] Subjective:   [name], am serving as a scribe for Dr. Antoine Primas.  I'm seeing this patient by the request  of:  Marja Kays, PA-C  CC: back and neck pain   [id number]  Bryce BURLIN Sr. is a 48 y.o. male coming in with complaint of back and neck pain. OMT 07/29/2023. Patient states cold has his hip hurting. Still pain in neck.  Nothing that stopping him from activity but continues to have discomfort.  Medications patient has been prescribed: None  Taking:      Reviewed prior external information including notes and imaging from previsou exam, outside providers and external EMR if available.   As well as notes that were available from care everywhere and other healthcare systems.  Past medical history, social, surgical and family history all reviewed in electronic medical record.  No pertanent information unless stated regarding to the chief complaint.   Past Medical History:  Diagnosis Date   Allergy    Anxiety    Cataract    Fibromyalgia    Glaucoma    Legally blind     Allergies  Allergen Reactions   Citrus Anaphylaxis   Other Hives   Latex Rash    welts     Review of Systems:  No headache, visual changes, nausea, vomiting, diarrhea, constipation, dizziness, abdominal pain, skin rash, fevers, chills, night sweats, weight loss, swollen lymph nodes, body aches, joint swelling, chest pain, shortness of breath, mood changes. POSITIVE muscle aches  Objective  Blood pressure 122/82, pulse 100, height 6\' 2"  (1.88 m), weight 224 lb (101.6 kg), SpO2 95%.   General: No apparent distress alert and oriented x3 mood and affect normal, dressed appropriately.  HEENT: Pupils equal, extraocular movements intact  Respiratory: Patient's speak in full sentences and does not appear short of breath  Cardiovascular: No lower extremity edema, non tender, no erythema   Neck exam does have some loss lordosis noted.  Some tenderness to palpation in the paraspinal musculature.  Patient does have some limited sidebending of the neck bilaterally.  Low back does have some loss of lordosis which is more than patient's baseline.  Osteopathic findings  C2 flexed rotated and side bent right C6 flexed rotated and side bent left T3 extended rotated and side bent right inhaled rib T9 extended rotated and side bent left L2 flexed rotated and side bent right L4 flexed rotated and side bent left Sacrum right on right    Assessment and Plan:  Degenerative disc disease, cervical Degenerative disc disease noted.  Discussed.  Continue activities.  Discussed icing regimen.  Follow-up again in 6 to 8 weeks    Nonallopathic problems  Decision today to treat with OMT was based on Physical Exam  After verbal consent patient was treated with HVLA, ME, FPR techniques in cervical, rib, thoracic, lumbar, and sacral  areas  Patient tolerated the procedure well with improvement in symptoms  Patient given exercises, stretches and lifestyle modifications  See medications in patient instructions if given  Patient will follow up in 4-8 weeks     The above documentation has been reviewed and is accurate and complete [name], DO         Note: This dictation was prepared with Dragon dictation along with smaller phrase technology. Any transcriptional errors that result from this process are unintentional.

## 2023-09-28 ENCOUNTER — Ambulatory Visit (INDEPENDENT_AMBULATORY_CARE_PROVIDER_SITE_OTHER): Payer: 59 | Admitting: Family Medicine

## 2023-09-28 ENCOUNTER — Encounter: Payer: Self-pay | Admitting: Family Medicine

## 2023-09-28 VITALS — BP 122/82 | HR 100 | Ht 74.0 in | Wt 224.0 lb

## 2023-09-28 DIAGNOSIS — M5030 Other cervical disc degeneration, unspecified cervical region: Secondary | ICD-10-CM

## 2023-09-28 DIAGNOSIS — M9902 Segmental and somatic dysfunction of thoracic region: Secondary | ICD-10-CM

## 2023-09-28 DIAGNOSIS — M9901 Segmental and somatic dysfunction of cervical region: Secondary | ICD-10-CM | POA: Diagnosis not present

## 2023-09-28 DIAGNOSIS — M9908 Segmental and somatic dysfunction of rib cage: Secondary | ICD-10-CM | POA: Diagnosis not present

## 2023-09-28 DIAGNOSIS — M9904 Segmental and somatic dysfunction of sacral region: Secondary | ICD-10-CM

## 2023-09-28 DIAGNOSIS — M9903 Segmental and somatic dysfunction of lumbar region: Secondary | ICD-10-CM

## 2023-09-28 NOTE — Assessment & Plan Note (Signed)
Degenerative disc disease noted.  Discussed.  Continue activities.  Discussed icing regimen.  Follow-up again in 6 to 8 weeks

## 2023-09-28 NOTE — Patient Instructions (Signed)
[place] when you can't walk dog See you again in 6-8 weeks

## 2023-10-21 ENCOUNTER — Ambulatory Visit: Payer: Self-pay | Admitting: Physician Assistant

## 2023-10-21 NOTE — Telephone Encounter (Signed)
Copied From CRM [id number]. Reason for Triage: Patient called to report that he had a recent muscle strain on 10/18/23. Patient was prescribed Robaxin 500mg  at bed time for muscle spasms and celebrex 100mg  2x per day.  Patient advised that he has not improved. The pain is enduring. When he shifts his weight to certain parts of his ankles, there's a feeling of electricity zapping through his foot.  Please advise. Pain is disruptive.   Chief Complaint: Back pain Symptoms: Right back pain with radiation into right leg  Frequency: Constant  Pertinent Negatives: Patient denies numbness, tingling, loss of bowel or bladder control Disposition: [] ED /[] Urgent Care (no appt availability in office) / [] Appointment(In office/virtual)/ []  [facility]/ [] Home Care/ [] Refused Recommended Disposition /[] [place] [facility]/ [x]  Follow-up with PCP Additional Notes: Patient reports he was seen by his PCP recently and diagnosed with a muscle strain. He states he was prescribed medication that has not been helping his pain and wanted to get in touch with his doctor at [facility], Patient advised that we are unable to schedule with that clinic and to call them in the morning. I have verified their phone number with the patient. Patient understood and is agreeable with this plan.    Reason for Disposition  Back pain present > 2 weeks  Answer Assessment - Initial Assessment Questions 1. ONSET: "When did the pain begin?"      2 weeks  2. LOCATION: "Where does it hurt?" (upper, mid or lower back)     Right lower back  3. SEVERITY: "How bad is the pain?"  (e.g., Scale 1-10; mild, moderate, or severe)   - MILD (1-3): Doesn't interfere with normal activities.    - MODERATE (4-7): Interferes with normal activities or awakens from sleep.    - SEVERE (8-10): Excruciating pain, unable to do any normal activities.      8/10 4. PATTERN: "Is the pain constant?" (e.g.,  yes, no; constant, intermittent)      Constant back/hip pain, leg pain is intermittent  5. RADIATION: "Does the pain shoot into your legs or somewhere else?"     Radiates to right leg  6. CAUSE:  "What do you think is causing the back pain?"      Was told he has a muscle strain 7. BACK OVERUSE:  "Any recent lifting of heavy objects, strenuous work or exercise?"     Yes 8. MEDICINES: "What have you taken so far for the pain?" (e.g., nothing, acetaminophen, NSAIDS)     Robaxin and Celebrex that was prescribed for him 9. NEUROLOGIC SYMPTOMS: "Do you have any weakness, numbness, or problems with bowel/bladder control?"     No 10. OTHER SYMPTOMS: "Do you have any other symptoms?" (e.g., fever, abdomen pain, burning with urination, blood in urine)       No  Protocols used: Back Pain-A-AH

## 2023-10-21 NOTE — Telephone Encounter (Signed)
Voicemail left for patient to call back for triage    Copied From CRM [id number]. Reason for Triage: Patient called to report that he had a recent muscle strain on 10/18/23. Patient was prescribed Robaxin 500mg  at bed time for muscle spasms and celebrex 100mg  2x per day.  Patient advised that he has not improved. The pain is enduring. When he shifts his weight to certain parts of his ankles, there's a feeling of electricity zapping through his foot.  Please advise. Pain is disruptive.

## 2023-10-22 ENCOUNTER — Ambulatory Visit: Payer: 59 | Admitting: Sports Medicine

## 2023-10-22 VITALS — BP 130/80 | HR 88 | Ht 74.0 in | Wt 218.6 lb

## 2023-10-22 DIAGNOSIS — R200 Anesthesia of skin: Secondary | ICD-10-CM

## 2023-10-22 DIAGNOSIS — M5441 Lumbago with sciatica, right side: Secondary | ICD-10-CM | POA: Diagnosis not present

## 2023-10-22 DIAGNOSIS — R202 Paresthesia of skin: Secondary | ICD-10-CM | POA: Diagnosis not present

## 2023-10-22 DIAGNOSIS — G8929 Other chronic pain: Secondary | ICD-10-CM

## 2023-10-22 MED ORDER — CYCLOBENZAPRINE HCL 5 MG PO TABS: 5.0000 mg | ORAL_TABLET | Freq: Every day | ORAL | 0 refills | Status: AC

## 2023-10-22 MED ORDER — KETOROLAC TROMETHAMINE 60 MG/2ML IM SOLN
60.0000 mg | Freq: Once | INTRAMUSCULAR | Status: AC
  Administered 2023-10-22: 60 mg via INTRAMUSCULAR

## 2023-10-22 MED ORDER — METHYLPREDNISOLONE ACETATE 80 MG/ML IJ SUSP
80.0000 mg | Freq: Once | INTRAMUSCULAR | Status: AC
  Administered 2023-10-22: 80 mg via INTRAMUSCULAR

## 2023-10-22 MED ORDER — MELOXICAM 15 MG PO TABS: 15.0000 mg | ORAL_TABLET | Freq: Every day | ORAL | 0 refills | Status: AC

## 2023-10-22 NOTE — Patient Instructions (Addendum)
Hep. Cocktail given today.  - Start meloxicam 15 mg daily x2 weeks.  If still having pain after 2 weeks, complete 3rd-week of NSAID. May use remaining NSAID as needed once daily for pain control.  Do not to use additional over-the-counter NSAIDs (ibuprofen, naproxen, Advil, Aleve) while taking prescription NSAIDs.  May use Tylenol [phone] mg 2 to 3 times a day for breakthrough pain.  Flexeril 5 to 10 mg  Return in 3  weeks.

## 2023-10-22 NOTE — Progress Notes (Signed)
[name] [address] [place] [address] Phone: [phone]   Assessment and Plan:     1. Chronic bilateral low back pain with right-sided sciatica 2. Numbness and tingling of right leg -Chronic with exacerbation, initial visit - Patient with new flare x 1 week of intense right posterior gluteal pain with radicular symptoms in the right leg.  Consistent with sciatica that patient has previously been diagnosed with, however patient states this episode feels more intense and different locations.  I suspect likely lumbar origin of sciatica based on physical exam, though differential includes piriformis syndrome/gluteal muscle spasms - Start meloxicam 15 mg daily x2 weeks.  If still having pain after 2 weeks, complete 3rd-week of NSAID. May use remaining NSAID as needed once daily for pain control.  Do not to use additional over-the-counter NSAIDs (ibuprofen, naproxen, Advil, Aleve) while taking prescription NSAIDs.  May use Tylenol [phone] mg 2 to 3 times a day for breakthrough pain. - Start HEP for gentle stretching of low back and gluteal musculature - Start Flexeril 5 to 10 mg nightly as needed for muscle spasms - Patient elected for IM injection of methylprednisone 80 mg/Toradol 60 mg.  Injection given in clinic today and tolerated well.  15 additional minutes spent for educating Therapeutic Home Exercise Program.  This included exercises focusing on stretching, strengthening, with focus on eccentric aspects.   Long term goals include an improvement in range of motion, strength, endurance as well as avoiding reinjury. Patient's frequency would include in 1-2 times a day, 3-5 times a week for a duration of 6-12 weeks. Proper technique shown and discussed handout in great detail with ATC.  All questions were discussed and answered.     Pertinent previous records reviewed include lumbar x-ray 2021  Follow Up: 3 to 4 weeks for  reevaluation.  Could consider repeat imaging versus physical therapy versus OMT if no improvement or worsening of symptoms   Subjective:   I, Rolland Bimler am a scribe for Dr. Jean Rosenthal.   Chief Complaint: right leg pain   HPI:   10/22/23 Patient is a 48 year old male with right leg pain. Patient states it is hurting today. Spasm all the way up to the hip. Numbness all the way down the leg. Pain level is at a 10 today.   Relevant Historical Information: Fibromyalgia  Additional pertinent review of systems negative.   Current Outpatient Medications:    brimonidine (ALPHAGAN) 0.2 % ophthalmic solution, Place 1 drop into both eyes 2 (two) times daily. , Disp: , Rfl:    cyclobenzaprine (FLEXERIL) 5 MG tablet, Take 1 tablet (5 mg total) by mouth at bedtime., Disp: 30 tablet, Rfl: 0   diclofenac Sodium (VOLTAREN) 1 % GEL, Apply 2 g topically in the morning and at bedtime., Disp: 100 g, Rfl: 4   dorzolamide-timolol (COSOPT) 22.3-6.8 MG/ML ophthalmic solution, Place 1 drop into both eyes 2 (two) times daily., Disp: , Rfl:    DULoxetine (CYMBALTA) 20 MG capsule, Take 2 capsules (40 mg total) by mouth daily., Disp: 60 capsule, Rfl: 3   gabapentin (NEURONTIN) 100 MG capsule, Take 2 capsules (200 mg total) by mouth at bedtime., Disp: 180 capsule, Rfl: 3   meloxicam (MOBIC) 15 MG tablet, Take 1 tablet (15 mg total) by mouth daily., Disp: 30 tablet, Rfl: 0   montelukast (SINGULAIR) 10 MG tablet, TAKE 1 TABLET BY MOUTH AT BEDTIME, Disp: 30 tablet, Rfl: 0   naproxen (NAPROSYN) 500  MG tablet, Take 1 tablet (500 mg total) by mouth 2 (two) times daily., Disp: 30 tablet, Rfl: 0   promethazine-dextromethorphan (PROMETHAZINE-DM) 6.25-15 MG/5ML syrup, Take 5 mLs by mouth 4 (four) times daily as needed for cough., Disp: 118 mL, Rfl: 0   travoprost, benzalkonium, (TRAVATAN) 0.004 % ophthalmic solution, Place 1 drop into both eyes at bedtime., Disp: , Rfl:    triamcinolone cream (KENALOG) 0.1 %, Apply 1  application topically 2 (two) times daily., Disp: 30 g, Rfl: 0   Objective:     Vitals:   10/22/23 1014  BP: 130/80  Pulse: 88  SpO2: 97%  Weight: 218 lb 9.6 oz (99.2 kg)  Height: 6\' 2"  (1.88 m)      Body mass index is 28.07 kg/m.    Physical Exam:    Gen: Appears well, nad, nontoxic and pleasant Psych: Alert and oriented, appropriate mood and affect Neuro: Increase sensation over right lateral thigh compared to left, decreased sensation to right distal leg compared to left.  Otherwise, sensation intact, strength is 5/5 in upper and lower extremities, muscle tone wnl Skin: no susupicious lesions or rashes  Back - Normal skin, Spine with normal alignment and no deformity.   No tenderness to vertebral process palpation.   Paraspinous muscles are not tender and without spasm TTP gluteal musculature Straight leg raise positive right Trendelenberg positive right Piriformis Test positive right Gait antalgic, favoring left leg Pain and radicular symptoms worsened by lumbar flexion and extension  Electronically signed by:  [name] 12:28 PM 10/22/23

## 2023-10-29 NOTE — Progress Notes (Signed)
 [name] [address] [place] [address] Phone: [phone] Subjective:    I'm seeing this patient by the request  of:  Marja Kays, PA-C  CC: Back and neck pain follow-up  [id number]  Bryce DORIS Sr. is a 48 y.o. male coming in with complaint of back and neck pain. OMT 09/28/2023. Saw Dr. Jean Rosenthal for R leg pain. Patient states has numbness lower leg to foot. Pain has resolved   Medications patient has been prescribed:   Taking:    Was seen by primary care for recent gluteal muscle strain on the right side been seen for lumbar radiculopathy on the right side by another provider in our office.  Given Flexeril as well as methylprednisone and Toradol.     Reviewed prior external information including notes and imaging from previsou exam, outside providers and external EMR if available.   As well as notes that were available from care everywhere and other healthcare systems.  Past medical history, social, surgical and family history all reviewed in electronic medical record.  No pertanent information unless stated regarding to the chief complaint.   Past Medical History:  Diagnosis Date   Allergy    Anxiety    Cataract    Fibromyalgia    Glaucoma    Legally blind     Allergies  Allergen Reactions   Citrus Anaphylaxis   Other Hives   Latex Rash    welts     Review of Systems:  No headache, visual changes, nausea, vomiting, diarrhea, constipation, dizziness, abdominal pain, skin rash, fevers, chills, night sweats, weight loss, swollen lymph nodes, body aches, joint swelling, chest pain, shortness of breath, mood changes. POSITIVE muscle aches  Objective  Blood pressure 132/80, pulse 91, height 6\' 2"  (1.88 m), weight 217 lb (98.4 kg), SpO2 97%.   General: No apparent distress alert and oriented x3 mood and affect normal, dressed appropriately.  HEENT: Pupils equal, extraocular movements intact  Respiratory: Patient's  speak in full sentences and does not appear short of breath  Cardiovascular: No lower extremity edema, non tender, no erythema  Gait MSK:  Back tightness noted overall.  Tightness noted with FABER test as well.  Tightness with straight leg test on the right side.  Does still have some weakness noted with dorsi flexion of the foot.  Osteopathic findings  C2 flexed rotated and side bent right C6 flexed rotated and side bent left T3 extended rotated and side bent right inhaled rib T9 extended rotated and side bent left L2 flexed rotated and side bent right Sacrum right on right       Assessment and Plan:  Radicular pain of right lower extremity Radicular symptoms noted.  Discussed icing regimen and home exercises, discussed which activities to do and which ones to avoid.  Increase activity slowly.  Discussed icing regimen.  Follow-up again in 6 to 8 weeks    Nonallopathic problems  Decision today to treat with OMT was based on Physical Exam  After verbal consent patient was treated with HVLA, ME, FPR techniques in cervical, rib, thoracic, lumbar, and sacral  areas only muscle energy on the lower back secondary to some of the radicular symptoms  Patient tolerated the procedure well with improvement in symptoms  Patient given exercises, stretches and lifestyle modifications  See medications in patient instructions if given  Patient will follow up in 4-8 weeks     The above documentation has been reviewed and is accurate and  complete [name], DO         Note: This dictation was prepared with Dragon dictation along with smaller phrase technology. Any transcriptional errors that result from this process are unintentional.

## 2023-11-09 ENCOUNTER — Ambulatory Visit: Payer: 59 | Admitting: Family Medicine

## 2023-11-09 ENCOUNTER — Encounter: Payer: Self-pay | Admitting: Family Medicine

## 2023-11-09 VITALS — BP 132/80 | HR 91 | Ht 74.0 in | Wt 217.0 lb

## 2023-11-09 DIAGNOSIS — M9904 Segmental and somatic dysfunction of sacral region: Secondary | ICD-10-CM | POA: Diagnosis not present

## 2023-11-09 DIAGNOSIS — M9902 Segmental and somatic dysfunction of thoracic region: Secondary | ICD-10-CM | POA: Diagnosis not present

## 2023-11-09 DIAGNOSIS — M9908 Segmental and somatic dysfunction of rib cage: Secondary | ICD-10-CM

## 2023-11-09 DIAGNOSIS — M9903 Segmental and somatic dysfunction of lumbar region: Secondary | ICD-10-CM

## 2023-11-09 DIAGNOSIS — M9901 Segmental and somatic dysfunction of cervical region: Secondary | ICD-10-CM

## 2023-11-09 DIAGNOSIS — M5410 Radiculopathy, site unspecified: Secondary | ICD-10-CM

## 2023-11-09 NOTE — Assessment & Plan Note (Signed)
 Radicular symptoms noted.  Discussed icing regimen and home exercises, discussed which activities to do and which ones to avoid.  Increase activity slowly.  Discussed icing regimen.  Follow-up again in 6 to 8 weeks

## 2023-11-09 NOTE — Patient Instructions (Signed)
 Good to see you We will see what the [facility] do  You can change the appointment and see me in 4-6 weeks

## 2023-11-16 ENCOUNTER — Ambulatory Visit: Payer: 59 | Admitting: Sports Medicine

## 2023-11-17 ENCOUNTER — Other Ambulatory Visit: Payer: Self-pay | Admitting: Sports Medicine

## 2023-12-07 NOTE — Progress Notes (Unsigned)
  [name] [address] [place] [address] Phone: [phone] Subjective:   Bryce [name], am serving as a scribe for Dr. Antoine [name].  I'm seeing this patient by the request  of:  Marja Kays, PA-C  CC: Low back pain follow-up  [id number]  Bryce FRALIX Sr. is a 48 y.o. male coming in with complaint of back and neck pain. OMT 11/09/2023. Patient states doing well. R side leg pain. No new concerns.  Medications patient has been prescribed: None  Taking:         Reviewed prior external information including notes and imaging from previsou exam, outside providers and external EMR if available.   As well as notes that were available from care everywhere and other healthcare systems.  Past medical history, social, surgical and family history all reviewed in electronic medical record.  No pertanent information unless stated regarding to the chief complaint.   Past Medical History:  Diagnosis Date   Allergy    Anxiety    Cataract    Fibromyalgia    Glaucoma    Legally blind     Allergies  Allergen Reactions   Citrus Anaphylaxis   Other Hives   Latex Rash    welts     Review of Systems:  No headache, visual changes, nausea, vomiting, diarrhea, constipation, dizziness, abdominal pain, skin rash, fevers, chills, night sweats, weight loss, swollen lymph nodes, body aches, joint swelling, chest pain, shortness of breath, mood changes. POSITIVE muscle aches  Objective  Blood pressure 128/86, pulse 92, height 6\' 2"  (1.88 m), weight 215 lb (97.5 kg), SpO2 97%.   General: No apparent distress alert and oriented x3 mood and affect normal, dressed appropriately.  HEENT: Pupils equal, extraocular movements intact  Respiratory: Patient's speak in full sentences and does not appear short of breath  Cardiovascular: No lower extremity edema, non tender, no erythema  Mild antalgic gait noted.  Patient does have weakness with  dorsiflexion of the foot noted.  4 out of 5 strength noted.  Achilles tendon reflexes 1+ compared to 2+ on the contralateral side.  Patient does have a antalgic gait that is concerning as well and a positive straight leg test at 20 degrees of forward flexion      Assessment and Plan:  Radicular pain of right lower extremity Worsening radicular pain with no numbness that is consistent as well as with weakness of dorsiflexion.  Achilles tendon does appear 1+ compared to 2+ on the contralateral side.  Do feel advanced imaging is warranted and MRI ordered today.  Depending on findings see if he is a candidate for possible epidural or steroid injections, possible medial branch block and radiofrequency ablation but with the radicular symptoms need to see if there is any significant nerve root impingement that could also need surgical intervention.  Has failed other conservative therapies such as osteopathic manipulation, medications such as gabapentin and anti-inflammatories as well as multiple months of physical therapy      The above documentation has been reviewed and is accurate and complete Bryce Saa, DO          Note: This dictation was prepared with Dragon dictation along with smaller phrase technology. Any transcriptional errors that result from this process are unintentional.

## 2023-12-08 ENCOUNTER — Encounter: Payer: Self-pay | Admitting: Family Medicine

## 2023-12-08 ENCOUNTER — Ambulatory Visit (INDEPENDENT_AMBULATORY_CARE_PROVIDER_SITE_OTHER)

## 2023-12-08 ENCOUNTER — Ambulatory Visit (INDEPENDENT_AMBULATORY_CARE_PROVIDER_SITE_OTHER): Admitting: Family Medicine

## 2023-12-08 VITALS — BP 128/86 | HR 92 | Ht 74.0 in | Wt 215.0 lb

## 2023-12-08 DIAGNOSIS — G8929 Other chronic pain: Secondary | ICD-10-CM | POA: Diagnosis not present

## 2023-12-08 DIAGNOSIS — M5410 Radiculopathy, site unspecified: Secondary | ICD-10-CM

## 2023-12-08 DIAGNOSIS — M545 Low back pain, unspecified: Secondary | ICD-10-CM | POA: Diagnosis not present

## 2023-12-08 NOTE — Patient Instructions (Addendum)
 MRI [facility] Shown today See you again in 6 weeks

## 2023-12-08 NOTE — Assessment & Plan Note (Signed)
 Worsening radicular pain with no numbness that is consistent as well as with weakness of dorsiflexion.  Achilles tendon does appear 1+ compared to 2+ on the contralateral side.  Do feel advanced imaging is warranted and MRI ordered today.  Depending on findings see if he is a candidate for possible epidural or steroid injections, possible medial branch block and radiofrequency ablation but with the radicular symptoms need to see if there is any significant nerve root impingement that could also need surgical intervention.  Has failed other conservative therapies such as osteopathic manipulation, medications such as gabapentin and anti-inflammatories as well as multiple months of physical therapy

## 2023-12-11 ENCOUNTER — Ambulatory Visit

## 2023-12-11 DIAGNOSIS — M5410 Radiculopathy, site unspecified: Secondary | ICD-10-CM

## 2023-12-11 DIAGNOSIS — G8929 Other chronic pain: Secondary | ICD-10-CM

## 2023-12-11 DIAGNOSIS — M545 Low back pain, unspecified: Secondary | ICD-10-CM | POA: Diagnosis not present

## 2023-12-21 ENCOUNTER — Encounter: Payer: Self-pay | Admitting: Family Medicine

## 2024-01-05 ENCOUNTER — Encounter: Payer: Self-pay | Admitting: Family Medicine

## 2024-01-24 NOTE — Progress Notes (Signed)
 [facility] [address] [place] [address] Phone: [phone] Subjective:   Bryce [name], am serving as a scribe for Dr. Ronnell Coins.  I'm seeing this patient by the request  of:  Drucilla Georgis, PA-C  CC: Back and neck pain follow-up  [id number]  Bryce ENCARNACION Sr. is a 48 y.o. male coming in with complaint of back and neck pain. OMT 12/08/2023. Patient states that he is still in the same amount of pain as last visit. Would like to discuss MRI results. Also c/o pain in R trap.   Medications patient has been prescribed: Meloxicam         Reviewed prior external information including notes and imaging from previsou exam, outside providers and external EMR if available.   As well as notes that were available from care everywhere and other healthcare systems.  Past medical history, social, surgical and family history all reviewed in electronic medical record.  No pertanent information unless stated regarding to the chief complaint.   Past Medical History:  Diagnosis Date   Allergy     Anxiety    Cataract    Fibromyalgia    Glaucoma    Legally blind     Allergies  Allergen Reactions   Citrus Anaphylaxis   Other Hives   Latex Rash    welts     Review of Systems:  No headache, visual changes, nausea, vomiting, diarrhea, constipation, dizziness, abdominal pain, skin rash, fevers, chills, night sweats, weight loss, swollen lymph nodes, body aches, joint swelling, chest pain, shortness of breath, mood changes. POSITIVE muscle aches  Objective  Blood pressure 118/82, pulse 83, height 6\' 2"  (1.88 m), weight 219 lb (99.3 kg), SpO2 97%.   General: No apparent distress alert and oriented x3 mood and affect normal, dressed appropriately.  HEENT: Pupils equal, extraocular movements intact  Respiratory: Patient's speak in full sentences and does not appear short of breath  Cardiovascular: No lower extremity edema, non tender, no  erythema  Gait relatively normal MSK:  Back does have some loss lordosis noted.  Discussed icing regimen and home exercises.  Tightness noted with Bryce [name] somewhat.  Still has some significant discomfort with straight leg test on the right side.  Osteopathic findings C6 flexed rotated and side bent left T3 extended rotated and side bent right inhaled rib T9 extended rotated and side bent left L2 flexed rotated and side bent right L5 flexed rotated and side bent right Sacrum right on right       Assessment and Plan:  Radicular pain of right lower extremity Patient's MRI is consistent with the nerve impingement that would give him his symptoms.  Do feel the possibility of an epidural would be beneficial.  Patient does have glaucoma and recently pressures did go up.  Will get approval from eye doctor to have this done.  Discussed icing regimen and home exercises.  Follow-up again in 6 to 8 weeks otherwise. We discussed his MRI in great detail.  Total time with patient today 31 minutes not including the manipulation after further evaluation.  Nonallopathic problems  Decision today to treat with OMT was based on Physical Exam  After verbal consent patient was treated with HVLA, ME, FPR techniques in cervical, rib, thoracic, lumbar, and sacral  areas avoided HVLA on the neck  Patient tolerated the procedure well with improvement in symptoms  Patient given exercises, stretches and lifestyle modifications  See medications in patient instructions if given  Patient will  follow up in 4-8 weeks    The above documentation has been reviewed and is accurate and complete Bryce Margo, DO          Note: This dictation was prepared with Dragon dictation along with smaller phrase technology. Any transcriptional errors that result from this process are unintentional.

## 2024-01-25 ENCOUNTER — Encounter: Payer: Self-pay | Admitting: Family Medicine

## 2024-01-25 ENCOUNTER — Ambulatory Visit (INDEPENDENT_AMBULATORY_CARE_PROVIDER_SITE_OTHER): Admitting: Family Medicine

## 2024-01-25 VITALS — BP 118/82 | HR 83 | Ht 74.0 in | Wt 219.0 lb

## 2024-01-25 DIAGNOSIS — M9902 Segmental and somatic dysfunction of thoracic region: Secondary | ICD-10-CM | POA: Diagnosis not present

## 2024-01-25 DIAGNOSIS — M9903 Segmental and somatic dysfunction of lumbar region: Secondary | ICD-10-CM | POA: Diagnosis not present

## 2024-01-25 DIAGNOSIS — M5410 Radiculopathy, site unspecified: Secondary | ICD-10-CM | POA: Diagnosis not present

## 2024-01-25 DIAGNOSIS — M9901 Segmental and somatic dysfunction of cervical region: Secondary | ICD-10-CM | POA: Diagnosis not present

## 2024-01-25 DIAGNOSIS — M9908 Segmental and somatic dysfunction of rib cage: Secondary | ICD-10-CM

## 2024-01-25 DIAGNOSIS — M9904 Segmental and somatic dysfunction of sacral region: Secondary | ICD-10-CM

## 2024-01-25 NOTE — Assessment & Plan Note (Signed)
 Patient's MRI is consistent with the nerve impingement that would give him his symptoms.  Do feel the possibility of an epidural would be beneficial.  Patient does have glaucoma and recently pressures did go up.  Will get approval from eye doctor to have this done.  Discussed icing regimen and home exercises.  Follow-up again in 6 to 8 weeks otherwise.

## 2024-01-25 NOTE — Patient Instructions (Signed)
 Check with eye doctor to see if you can have epidural See me in 6-8 weeks

## 2024-03-06 NOTE — Progress Notes (Unsigned)
 [name] [address] [place] [address] Phone: [phone] Subjective:   Bryce [name], am serving as a scribe for Dr. Arthea Claudene.  I'm seeing this patient by the request  of:  Lenon Alyce SAUNDERS, PA-C  CC: Back and neck pain follow-up  YEP:[name]  NYAIR DEPAULO Sr. is a 48 y.o. male coming in with complaint of back and neck pain. OMT 01/25/2024. Patient states that he is tight after traveling back from [place] yesterday.   Has not received epidural injection yet for lower back. Eye doc has given permission for injection.   Medications patient has been prescribed: None  Taking:    Reviewing patient's outside records for his clot, it appears that physician said it was okay to get back injection.     Reviewed prior external information including notes and imaging from previsou exam, outside providers and external EMR if available.   As well as notes that were available from care everywhere and other healthcare systems.  Past medical history, social, surgical and family history all reviewed in electronic medical record.  No pertanent information unless stated regarding to the chief complaint.   Past Medical History:  Diagnosis Date   Allergy     Anxiety    Cataract    Fibromyalgia    Glaucoma    Legally blind     Allergies  Allergen Reactions   Citrus Anaphylaxis   Other Hives   Latex Rash    welts     Review of Systems:  No headache, visual changes, nausea, vomiting, diarrhea, constipation, dizziness, abdominal pain, skin rash, fevers, chills, night sweats, weight loss, swollen lymph nodes, body aches, joint swelling, chest pain, shortness of breath, mood changes. POSITIVE muscle aches  Objective  Blood pressure 118/88, pulse 89, height 6' 2 (1.88 m), weight 222 lb (100.7 kg), SpO2 97%.   General: No apparent distress alert and oriented x3 mood and affect normal, dressed appropriately.  HEENT: Pupils equal, extraocular  movements intact  Respiratory: Patient's speak in full sentences and does not appear short of breath  Cardiovascular: No lower extremity edema, non tender, no erythema  Gait MSK:  Back does have some loss of lordosis.  Some tenderness to palpation in the paraspinal musculature.  Tenderness and tingling right L5 open positive radicular symptoms with straight leg test some  Osteopathic findings  C4 flexed rotated and side bent left T4 extended rotated and side bent right inhaled rib L2 flexed rotated and side bent right L5 flexed rotated and side bent right Sacrum right on right       Assessment and Plan:  Radicular pain of right lower extremity Patient is scheduled to have an epidural now that patient's eye physician does state that we are in a good position to do this and they will recheck pressures for the glaucoma 2 weeks after the injection.  Would like to see the patient 6 to 8 weeks afterwards.    Nonallopathic problems  Decision today to treat with OMT was based on Physical Exam  After verbal consent patient was treated with  ME, FPR techniques in cervical, rib, thoracic, lumbar, and sacral  areas  Patient tolerated the procedure well with improvement in symptoms  Patient given exercises, stretches and lifestyle modifications  See medications in patient instructions if given  Patient will follow up in 4-8 weeks    The above documentation has been reviewed and is accurate and complete Marlina Cataldi M Savahanna Almendariz, DO  Note: This dictation was prepared with Dragon dictation along with smaller phrase technology. Any transcriptional errors that result from this process are unintentional.

## 2024-03-07 ENCOUNTER — Other Ambulatory Visit: Payer: Self-pay

## 2024-03-07 ENCOUNTER — Encounter: Payer: Self-pay | Admitting: Family Medicine

## 2024-03-07 ENCOUNTER — Ambulatory Visit: Admitting: Family Medicine

## 2024-03-07 VITALS — BP 118/88 | HR 89 | Ht 74.0 in | Wt 222.0 lb

## 2024-03-07 DIAGNOSIS — M9903 Segmental and somatic dysfunction of lumbar region: Secondary | ICD-10-CM

## 2024-03-07 DIAGNOSIS — M5416 Radiculopathy, lumbar region: Secondary | ICD-10-CM

## 2024-03-07 DIAGNOSIS — M9908 Segmental and somatic dysfunction of rib cage: Secondary | ICD-10-CM

## 2024-03-07 DIAGNOSIS — M9902 Segmental and somatic dysfunction of thoracic region: Secondary | ICD-10-CM | POA: Diagnosis not present

## 2024-03-07 DIAGNOSIS — M9904 Segmental and somatic dysfunction of sacral region: Secondary | ICD-10-CM | POA: Diagnosis not present

## 2024-03-07 DIAGNOSIS — M5410 Radiculopathy, site unspecified: Secondary | ICD-10-CM | POA: Diagnosis not present

## 2024-03-07 DIAGNOSIS — M9901 Segmental and somatic dysfunction of cervical region: Secondary | ICD-10-CM | POA: Diagnosis not present

## 2024-03-07 NOTE — Patient Instructions (Addendum)
 Epidural [phone] See you again in 8 weeks

## 2024-03-07 NOTE — Assessment & Plan Note (Signed)
 Patient is scheduled to have an epidural now that patient's eye physician does state that we are in a good position to do this and they will recheck pressures for the glaucoma 2 weeks after the injection.  Would like to see the patient 6 to 8 weeks afterwards.

## 2024-05-02 ENCOUNTER — Ambulatory Visit: Admitting: Family Medicine

## 2024-05-09 ENCOUNTER — Ambulatory Visit: Payer: Self-pay
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
## 2024-05-26 NOTE — Progress Notes (Deleted)
  [name] [address] [place] [address] Phone: [phone] Subjective:    I'm seeing this patient by the request  of:  Bryce Alyce SAUNDERS, PA-C  CC:   YEP:[name]  Bryce CORRELL Sr. is a 48 y.o. male coming in with complaint of back and neck pain. OMT on 03/07/2024. Patient states   Medications patient has been prescribed:   Taking:         Reviewed prior external information including notes and imaging from previsou exam, outside providers and external EMR if available.   As well as notes that were available from care everywhere and other healthcare systems.  Past medical history, social, surgical and family history all reviewed in electronic medical record.  No pertanent information unless stated regarding to the chief complaint.   Past Medical History:  Diagnosis Date   Allergy     Anxiety    Cataract    Fibromyalgia    Glaucoma    Legally blind     Allergies  Allergen Reactions   Citrus Anaphylaxis   Other Hives   Latex Rash    welts     Review of Systems:  No headache, visual changes, nausea, vomiting, diarrhea, constipation, dizziness, abdominal pain, skin rash, fevers, chills, night sweats, weight loss, swollen lymph nodes, body aches, joint swelling, chest pain, shortness of breath, mood changes. POSITIVE muscle aches  Objective  There were no vitals taken for this visit.   General: No apparent distress alert and oriented x3 mood and affect normal, dressed appropriately.  HEENT: Pupils equal, extraocular movements intact  Respiratory: Patient's speak in full sentences and does not appear short of breath  Cardiovascular: No lower extremity edema, non tender, no erythema  Gait MSK:  Back   Osteopathic findings  C2 flexed rotated and side bent right C6 flexed rotated and side bent left T3 extended rotated and side bent right inhaled rib T9 extended rotated and side bent left L2 flexed rotated and side bent  right Sacrum right on right       Assessment and Plan:  No problem-specific Assessment & Plan notes found for this encounter.    Nonallopathic problems  Decision today to treat with OMT was based on Physical Exam  After verbal consent patient was treated with HVLA, ME, FPR techniques in cervical, rib, thoracic, lumbar, and sacral  areas  Patient tolerated the procedure well with improvement in symptoms  Patient given exercises, stretches and lifestyle modifications  See medications in patient instructions if given  Patient will follow up in 4-8 weeks             Note: This dictation was prepared with Dragon dictation along with smaller phrase technology. Any transcriptional errors that result from this process are unintentional.

## 2024-05-29 ENCOUNTER — Ambulatory Visit: Admitting: Family Medicine

## 2024-06-14 NOTE — Progress Notes (Deleted)
  [name] [address] [place] [address] Phone: [phone] Subjective:    I'm seeing this patient by the request  of:  Lenon Alyce SAUNDERS, PA-C  CC: back and neck pain   YEP:[name]  Bryce [name] Sr. is a 48 y.o. male coming in with complaint of back and neck pain. OMT 03/07/2024. Patient states   Medications patient has been prescribed: None  Taking:         Reviewed prior external information including notes and imaging from previsou exam, outside providers and external EMR if available.   As well as notes that were available from care everywhere and other healthcare systems.  Past medical history, social, surgical and family history all reviewed in electronic medical record.  No pertanent information unless stated regarding to the chief complaint.   Past Medical History:  Diagnosis Date   Allergy     Anxiety    Cataract    Fibromyalgia    Glaucoma    Legally blind     Allergies  Allergen Reactions   Citrus Anaphylaxis   Other Hives   Latex Rash    welts     Review of Systems:  No headache, visual changes, nausea, vomiting, diarrhea, constipation, dizziness, abdominal pain, skin rash, fevers, chills, night sweats, weight loss, swollen lymph nodes, body aches, joint swelling, chest pain, shortness of breath, mood changes. POSITIVE muscle aches  Objective  There were no vitals taken for this visit.   General: No apparent distress alert and oriented x3 mood and affect normal, dressed appropriately.  HEENT: Pupils equal, extraocular movements intact  Respiratory: Patient's speak in full sentences and does not appear short of breath  Cardiovascular: No lower extremity edema, non tender, no erythema  Gait MSK:  Back low back does have some loss of lordosis noted.  Some tenderness to palpation of the paraspinal musculature.  Seems worse in the right paraspinal musculature of the lumbar spine and the periscapular  area.  Osteopathic findings  C2 flexed rotated and side bent right C6 flexed rotated and side bent left T3 extended rotated and side bent right inhaled rib T9 extended rotated and side bent right L2 flexed rotated and side bent right L4 flexed rotated and side bent right Sacrum right on right    Assessment and Plan:  No problem-specific Assessment & Plan notes found for this encounter.    Nonallopathic problems  Decision today to treat with OMT was based on Physical Exam  After verbal consent patient was treated with HVLA, ME, FPR techniques in cervical, rib, thoracic, lumbar, and sacral  areas  Patient tolerated the procedure well with improvement in symptoms  Patient given exercises, stretches and lifestyle modifications  See medications in patient instructions if given  Patient will follow up in 4-8 weeks     The above documentation has been reviewed and is accurate and complete Bryce Milewski M Evans Levee, DO         Note: This dictation was prepared with Dragon dictation along with smaller phrase technology. Any transcriptional errors that result from this process are unintentional.

## 2024-06-19 ENCOUNTER — Ambulatory Visit: Admitting: Family Medicine

## 2024-07-11 NOTE — Progress Notes (Unsigned)
 [name] [address] [place] [address] Phone: [phone] Subjective:   Bryce [name], am serving as a scribe for Dr. Arthea [name].  I'm seeing this patient by the request  of:  Bryce Alyce SAUNDERS, PA-C  CC: Back and neck pain  YEP:[name]  Bryce [name] Sr. is a 48 y.o. male coming in with complaint of back and neck pain. OMT on 03/07/2024. Patient states R shoulder and L hip bothersome today. Doing well.  Has had some tightness but nothing severe at the moment.  Medications patient has been prescribed:   Taking:         Reviewed prior external information including notes and imaging from previsou exam, outside providers and external EMR if available.   As well as notes that were available from care everywhere and other healthcare systems.  Past medical history, social, surgical and family history all reviewed in electronic medical record.  No pertanent information unless stated regarding to the chief complaint.   Past Medical History:  Diagnosis Date   Allergy     Anxiety    Cataract    Fibromyalgia    Glaucoma    Legally blind     Allergies  Allergen Reactions   Citrus Anaphylaxis   Other Hives   Latex Rash    welts     Review of Systems:  No headache, visual changes, nausea, vomiting, diarrhea, constipation, dizziness, abdominal pain, skin rash, fevers, chills, night sweats, weight loss, swollen lymph nodes, body aches, joint swelling, chest pain, shortness of breath, mood changes. POSITIVE muscle aches  Objective  Blood pressure 120/78, pulse 62, height 6' 2 (1.88 m), SpO2 97%.   General: No apparent distress alert and oriented x3 mood and affect normal, dressed appropriately.  HEENT: Pupils equal, extraocular movements intact  Respiratory: Patient's speak in full sentences and does not appear short of breath  Cardiovascular: No lower extremity edema, non tender, no erythema  Gait MSK:  Back loss of lordosis  noted.  Significant tightness noted in the right parascapular area.  Patient does have tightness noted with certain range of motion.  Osteopathic findings  C2 flexed rotated and side bent right C5 flexed rotated and side bent left T3 extended rotated and side bent right inhaled rib T5 extended rotated and side bent right L2 flexed rotated and side bent right L3 flexed rotated and side bent left Sacrum right on right       Assessment and Plan:  Back pain Multifactorial.  Living with in different area of town at the moment.  Patient is not in a regular home or his regular bed which she does think is potentially contributing.  Increase activity slowly.  Discussed icing regimen, increase activity slowly.  Follow-up again in 6 to 12 weeks Toradol  and Depo-Medrol  injection given today secondary to exacerbation.    Nonallopathic problems  Decision today to treat with OMT was based on Physical Exam  After verbal consent patient was treated with HVLA, ME, FPR techniques in cervical, rib, thoracic, lumbar, and sacral  areas  Patient tolerated the procedure well with improvement in symptoms  Patient given exercises, stretches and lifestyle modifications  See medications in patient instructions if given  Patient will follow up in 4-8 weeks     The above documentation has been reviewed and is accurate and complete Bryce CHRISTELLA Claudene, DO         Note: This dictation was prepared with Dragon dictation along with smaller phrase  technology. Any transcriptional errors that result from this process are unintentional.

## 2024-07-13 ENCOUNTER — Encounter: Payer: Self-pay | Admitting: Family Medicine

## 2024-07-13 ENCOUNTER — Ambulatory Visit (INDEPENDENT_AMBULATORY_CARE_PROVIDER_SITE_OTHER): Admitting: Family Medicine

## 2024-07-13 VITALS — BP 120/78 | HR 62 | Ht 74.0 in

## 2024-07-13 DIAGNOSIS — M9904 Segmental and somatic dysfunction of sacral region: Secondary | ICD-10-CM

## 2024-07-13 DIAGNOSIS — M545 Low back pain, unspecified: Secondary | ICD-10-CM | POA: Diagnosis not present

## 2024-07-13 DIAGNOSIS — M9902 Segmental and somatic dysfunction of thoracic region: Secondary | ICD-10-CM

## 2024-07-13 DIAGNOSIS — M9903 Segmental and somatic dysfunction of lumbar region: Secondary | ICD-10-CM

## 2024-07-13 DIAGNOSIS — M5416 Radiculopathy, lumbar region: Secondary | ICD-10-CM | POA: Diagnosis not present

## 2024-07-13 DIAGNOSIS — M9901 Segmental and somatic dysfunction of cervical region: Secondary | ICD-10-CM

## 2024-07-13 DIAGNOSIS — G8929 Other chronic pain: Secondary | ICD-10-CM

## 2024-07-13 DIAGNOSIS — M9908 Segmental and somatic dysfunction of rib cage: Secondary | ICD-10-CM | POA: Diagnosis not present

## 2024-07-13 MED ORDER — KETOROLAC TROMETHAMINE 30 MG/ML IJ SOLN
30.0000 mg | Freq: Once | INTRAMUSCULAR | Status: AC
  Administered 2024-07-13: 30 mg via INTRAMUSCULAR

## 2024-07-13 MED ORDER — METHYLPREDNISOLONE ACETATE 40 MG/ML IJ SUSP
40.0000 mg | Freq: Once | INTRAMUSCULAR | Status: AC
  Administered 2024-07-13: 40 mg via INTRAMUSCULAR

## 2024-07-13 NOTE — Assessment & Plan Note (Signed)
 Multifactorial.  Living with in different area of town at the moment.  Patient is not in a regular home or his regular bed which she does think is potentially contributing.  Increase activity slowly.  Discussed icing regimen, increase activity slowly.  Follow-up again in 6 to 12 weeks Toradol  and Depo-Medrol  injection given today secondary to exacerbation.

## 2024-07-13 NOTE — Patient Instructions (Addendum)
 Stop helping people move Half cocktail injections today in backside.  See you again in 6-8 weeks

## 2024-09-06 ENCOUNTER — Ambulatory Visit: Admitting: Family Medicine

## 2024-10-12 NOTE — Progress Notes (Unsigned)
 " [name] [address] [place] [address] Phone: [phone] Subjective:   [name], am serving as a scribe for Dr. Arthea Claudene.  I'm seeing this patient by the request  of:  Dalton, Evalene PARAS, MD  CC: Back and neck pain follow-up  YEP:[name]  WOODROW DRAB Sr. is a 49 y.o. male coming in with complaint of back and neck pain. OMT 07/13/2025. Patient states that he has pain in lower back that is radiating into the glutes since he has not been able to get out and walk. Patient has pain in mornings and with sit to stand. Pain is sharp jolt of pain that is increasing in frequency. Wants to know if he needs an increase in dose of gabapentin .   Medications patient has been prescribed: None  Taking:         Reviewed prior external information including notes and imaging from previsou exam, outside providers and external EMR if available.   As well as notes that were available from care everywhere and other healthcare systems.  Past medical history, social, surgical and family history all reviewed in electronic medical record.  No pertanent information unless stated regarding to the chief complaint.   Past Medical History:  Diagnosis Date   Allergy     Anxiety    Cataract    Fibromyalgia    Glaucoma    Legally blind     Allergies[1]   Review of Systems:  No headache, visual changes, nausea, vomiting, diarrhea, constipation, dizziness, abdominal pain, skin rash, fevers, chills, night sweats, weight loss, swollen lymph nodes, body aches, joint swelling, chest pain, shortness of breath, mood changes. POSITIVE muscle aches  Objective  Blood pressure (!) 140/102, pulse 86, height 6' 2 (1.88 m), weight 217 lb (98.4 kg), SpO2 98%.   General: No apparent distress alert and oriented x3 mood and affect normal, dressed appropriately.  HEENT: Pupils equal, extraocular movements intact  Respiratory: Patient's speak in full sentences and  does not appear short of breath  Cardiovascular: No lower extremity edema, non tender, no erythema  Gait MSK:  Back does have some loss of lordosis noted.  Worsening pain with some extension of the back noted.  Some tightness with straight leg test right greater than left.  This is at 20 degrees of forward flexion.  Osteopathic findings  C5 flexed rotated and side bent right C6lexed rotated and side bent left T3 extended rotated and side bent right inhaled rib T9 extended rotated and side bent left L2 flexed rotated and side bent right L3 flexed rotated and side bent right Sacrum right on right       Assessment and Plan:  Radicular pain of right lower extremity Continues to have the intermittent radicular symptoms.  Has the nerve pretreated gentleman that did respond well to injection previously.  Discussed icing regimen and home exercises, discussed which activities to do and which ones to avoid.  Increase activity slowly.  Discussed icing regimen.  Follow-up again in 2 to 3 weeks.    Nonallopathic problems  Decision today to treat with OMT was based on Physical Exam  After verbal consent patient was treated with HVLA, ME, FPR techniques in cervical, rib, thoracic, lumbar, and sacral  areas  Patient tolerated the procedure well with improvement in symptoms  Patient given exercises, stretches and lifestyle modifications  See medications in patient instructions if given  Patient will follow up in 4-8 weeks    The above  documentation has been reviewed and is accurate and complete Arthea CHRISTELLA Sharps, DO          Note: This dictation was prepared with Dragon dictation along with smaller phrase technology. Any transcriptional errors that result from this process are unintentional.            [1]  Allergies Allergen Reactions   Citrus Anaphylaxis   Other Hives   Latex Rash    welts   "

## 2024-10-13 ENCOUNTER — Ambulatory Visit: Admitting: Family Medicine

## 2024-10-13 ENCOUNTER — Encounter: Payer: Self-pay | Admitting: Family Medicine

## 2024-10-13 VITALS — BP 140/102 | HR 86 | Ht 74.0 in | Wt 217.0 lb

## 2024-10-13 DIAGNOSIS — M5416 Radiculopathy, lumbar region: Secondary | ICD-10-CM

## 2024-10-13 DIAGNOSIS — M9901 Segmental and somatic dysfunction of cervical region: Secondary | ICD-10-CM

## 2024-10-13 DIAGNOSIS — M9904 Segmental and somatic dysfunction of sacral region: Secondary | ICD-10-CM

## 2024-10-13 DIAGNOSIS — M9908 Segmental and somatic dysfunction of rib cage: Secondary | ICD-10-CM

## 2024-10-13 DIAGNOSIS — M5410 Radiculopathy, site unspecified: Secondary | ICD-10-CM

## 2024-10-13 DIAGNOSIS — M9902 Segmental and somatic dysfunction of thoracic region: Secondary | ICD-10-CM

## 2024-10-13 DIAGNOSIS — M9903 Segmental and somatic dysfunction of lumbar region: Secondary | ICD-10-CM

## 2024-10-13 MED ORDER — METHYLPREDNISOLONE ACETATE 80 MG/ML IJ SUSP
80.0000 mg | Freq: Once | INTRAMUSCULAR | Status: AC
  Administered 2024-10-13: 80 mg via INTRAMUSCULAR

## 2024-10-13 MED ORDER — KETOROLAC TROMETHAMINE 60 MG/2ML IM SOLN
60.0000 mg | Freq: Once | INTRAMUSCULAR | Status: AC
  Administered 2024-10-13: 60 mg via INTRAMUSCULAR

## 2024-10-13 NOTE — Patient Instructions (Addendum)
 Injections in backside. No NSAIDs for 24 hours.  If not better will get epidural See me in 6 weeks

## 2024-10-13 NOTE — Assessment & Plan Note (Addendum)
 Continues to have the intermittent radicular symptoms.  Has the nerve pretreated gentleman that did respond well to injection previously.  Discussed icing regimen and home exercises, discussed which activities to do and which ones to avoid.  Increase activity slowly.  Discussed icing regimen.  Follow-up again in 2 to 3 weeks.  Toradol  and Depo-Medrol  injection given today to secondary to the acute flare of the pain

## 2024-11-24 ENCOUNTER — Ambulatory Visit: Admitting: Family Medicine
